# Patient Record
Sex: Male | Born: 1950
Health system: Southern US, Community
[De-identification: ages and names within clinical notes are randomized; demographics above are authoritative.]

## PROBLEM LIST (undated history)

## (undated) DIAGNOSIS — I10 Essential (primary) hypertension: Secondary | ICD-10-CM

## (undated) DIAGNOSIS — E7800 Pure hypercholesterolemia, unspecified: Secondary | ICD-10-CM

## (undated) DIAGNOSIS — N179 Acute kidney failure, unspecified: Secondary | ICD-10-CM

## (undated) DIAGNOSIS — J189 Pneumonia, unspecified organism: Secondary | ICD-10-CM

## (undated) HISTORY — DX: Pure hypercholesterolemia, unspecified: E78.00

## (undated) HISTORY — PX: COLONOSCOPY: SHX174

---
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
## 2006-02-26 ENCOUNTER — Ambulatory Visit: Payer: Self-pay | Admitting: Gastroenterology

## 2006-03-15 ENCOUNTER — Encounter (INDEPENDENT_AMBULATORY_CARE_PROVIDER_SITE_OTHER): Payer: Self-pay | Admitting: Specialist

## 2006-03-15 ENCOUNTER — Ambulatory Visit (HOSPITAL_COMMUNITY): Admission: RE | Admit: 2006-03-15 | Discharge: 2006-03-15 | Payer: Self-pay | Admitting: Gastroenterology

## 2006-03-15 ENCOUNTER — Ambulatory Visit: Payer: Self-pay | Admitting: Gastroenterology

## 2007-11-22 ENCOUNTER — Ambulatory Visit (HOSPITAL_COMMUNITY): Admission: RE | Admit: 2007-11-22 | Discharge: 2007-11-22 | Payer: Self-pay | Admitting: Family Medicine

## 2010-10-31 NOTE — Op Note (Signed)
Gregory Lindsey, Gregory Lindsey              ACCOUNT NO.:  [id number]   MEDICAL RECORD NO.:  [id number]          PATIENT TYPE:  AMB   LOCATION:  DAY                           FACILITY:  [facility]   PHYSICIAN:  Kassie Mends, M.D.      DATE OF BIRTH:  07-Jul-1950   DATE OF PROCEDURE:  03/15/2006  DATE OF DISCHARGE:                                 OPERATIVE REPORT   REFERRING PHYSICIAN:  Mila Homer. Sudie Bailey, M.D.   PROCEDURE:  Colonoscopy with snare polypectomy.   INDICATIONS FOR EXAM:  Mr. Manninen is a 60 year old male who presents with a  hemoglobin of 11.8, MCV of 89.4, and no evidence of overt GI bleed.  He has  no family history of colon cancer.   FINDINGS:  1. An 8 mm sessile ascending colon polyp, removed via snare cautery.  2. A 1 cm pedunculated sigmoid colon polyp removed via snare cautery      (200/25).  3. Otherwise no masses, inflammatory changes, or vascular ectasias seen.      No diverticula evident.  4. Normal retroflexed view of the rectum.   RECOMMENDATIONS:  1. The ascending colon polyp and the sigmoid colon polyp may explain      occult GI bleeding.  I will call Mr. Leider with his biopsy results.      If his polyps are adenomatous, he will need a repeat colonoscopy in 3-5      years.  His children and siblings will need screening colonoscopy      beginning at age 78 and a screening colonoscopy every 5 years.  2. He should be on a high-fiber diet.  He is given a handout on high-fiber      diet and polyps today.  3. Follow up with Dr. Gareth Morgan.  If his loose stools persist, he may      follow up with Dr. Kassie Mends.   PROCEDURAL TECHNIQUE:  Physical exam was performed, and informed consent was  obtained per the patient after explaining the risks, benefits and  alternatives to the procedure.  Patient was connected to the monitor and  placed in the left lateral decubitus position.  Continuous oxygen was  provided by nasal cannula, and IV medicine administered through  an  indwelling cannula.  After administration of sedation and rectal exam, the  patient's rectum  was intubated, and scope was advanced under direct visualization to the  cecum.  The scope was subsequently removed slowly by carefully examining the  color, texture, anatomy, and integrity of the mucosa on the way out.  The  patient was recovered in endoscopy suite and discharged home in satisfactory  condition.      Kassie Mends, M.D.  Electronically Signed     SM/MEDQ  D:  03/15/2006  T:  03/16/2006  Job:  [id number]   cc:   Mila Homer. Sudie Bailey, M.D.  Fax: [phone]

## 2010-10-31 NOTE — Consult Note (Signed)
Gregory Lindsey, Gregory Lindsey              ACCOUNT NO.:  [id number]   MEDICAL RECORD NO.:  [id number]          PATIENT TYPE:  AMB   LOCATION:                                FACILITY:  [facility]   PHYSICIAN:  Kassie Mends, M.D.      DATE OF BIRTH:  July 12, 1950   DATE OF CONSULTATION:  02/26/2006  DATE OF DISCHARGE:                                   CONSULTATION   REASON FOR CONSULTATION:  Anemia, possible blood in stool.   HISTORY OF PRESENT ILLNESS:  Gregory Lindsey is a 60 year old gentleman who  presents for further evaluation of recent diagnosis of anemia.  He has never  had a colonoscopy.  He was asked to collect hemoccults, but he has not done  this yet.  He says he has noted intermittent reddish tint to his stools but  he is not really sure if it is blood.  His bowel movements were typically  two a day, but recently when he began Januvia, he started having about 3-5  loose stools a day.  He denies any abdominal pain or melena.  No nausea,  vomiting, or heartburn.  His weight has been stable.   CURRENT MEDICATIONS:  Enalapril daily, Vytorin 10/40 mg daily, Metformin 500  mg b.i.d., Januvia 100 mg daily, Cialis p.r.n., aspirin 81 mg daily.   ALLERGIES:  No known drug allergies.   PAST MEDICAL HISTORY:  Hypertension, hyperlipidemia, type 2 diabetes  mellitus.   PAST SURGICAL HISTORY:  No prior surgical history.   FAMILY HISTORY:  Negative for colorectal cancer, chronic GI illnesses.  Father had diabetes, deceased at age 22.   SOCIAL HISTORY:  He is married and has one child.  He is employed with [employer].  He quit smoking 5 1/2 months ago.  He rarely consumes alcohol and  generally consists of wine.   REVIEW OF SYSTEMS:  See HPI for GI and constitutional.  CARDIOPULMONARY:  No  chest pain or shortness of breath.   PHYSICAL EXAMINATION:  VITAL SIGNS:  Weight 327, height 5 foot 10 inches, temperature 98.4, blood  pressure 118/70, pulse 84.  GENERAL:  Pleasant, obese middle aged  gentleman in no acute distress.  SKIN:  Warm and dry, no jaundice.  HEENT:  Sclerae nonicteric.  Oropharyngeal mucosa moist and pink.  No  lymphadenopathy or thyromegaly.  CHEST:  Lungs clear to auscultation.  CARDIAC:  Exam reveals regular rate and rhythm, normal S1 and S2, no  murmurs, gallops, and rubs.  ABDOMEN:  Positive bowel sounds, obese but symmetrical, soft, nontender.  No  organomegaly or masses.  No rebound tenderness or guarding.  No abdominal  bruits or herniae.  Examination somewhat limited due to body habitus.  EXTREMITIES:  No edema.   LABORATORY DATA:  White count 11,300, hemoglobin 11.8, hematocrit 37, MCV  89.4, platelets 272,000.  LFTs and MET-7 normal.  Hemoglobin A1c 6.8.   IMPRESSION:  Gregory Lindsey is a 60 year old gentleman recently found to have anemia  on routine blood work.  He states his stools are reddish tint, but he is not  sure if it is  blood.  Hemoccults are pending.  Recently, he has developed  increased frequency and his stools are now loose.  This may be due to a  medication change.  He has never had a colonoscopy and he wants further  evaluation of his symptoms.   PLAN:  1. Colonoscopy in the future with Dr. Kassie Mends.  2. He will hold his aspirin for three days prior to the procedure.   I would like to thank Dr. John Giovanni for allowing [place] take part in the  care of this patient.      Gregory Lindsey, P.A.      Kassie Mends, M.D.  Electronically Signed    LL/MEDQ  D:  02/26/2006  T:  02/27/2006  Job:  [id number]   cc:   Mila Homer. Sudie Bailey, M.D.  Fax: [phone]

## 2011-02-10 ENCOUNTER — Emergency Department (HOSPITAL_COMMUNITY)
Admission: EM | Admit: 2011-02-10 | Discharge: 2011-02-10 | Disposition: A | Discharge status: Home / Self Care | Payer: Managed Care, Other (non HMO) | Attending: Emergency Medicine | Admitting: Emergency Medicine

## 2011-02-10 ENCOUNTER — Emergency Department (HOSPITAL_COMMUNITY): Payer: Managed Care, Other (non HMO)

## 2011-02-10 DIAGNOSIS — M67919 Unspecified disorder of synovium and tendon, unspecified shoulder: Secondary | ICD-10-CM

## 2011-02-10 DIAGNOSIS — Z7982 Long term (current) use of aspirin: Secondary | ICD-10-CM | POA: Insufficient documentation

## 2011-02-10 DIAGNOSIS — S40019A Contusion of unspecified shoulder, initial encounter: Secondary | ICD-10-CM

## 2011-02-10 DIAGNOSIS — W010XXA Fall on same level from slipping, tripping and stumbling without subsequent striking against object, initial encounter: Secondary | ICD-10-CM | POA: Insufficient documentation

## 2011-02-10 DIAGNOSIS — M25519 Pain in unspecified shoulder: Secondary | ICD-10-CM | POA: Insufficient documentation

## 2011-02-10 DIAGNOSIS — Z79899 Other long term (current) drug therapy: Secondary | ICD-10-CM | POA: Insufficient documentation

## 2011-02-10 LAB — GLUCOSE, CAPILLARY

## 2011-02-10 MED ORDER — TRAMADOL HCL 50 MG PO TABS
100.0000 mg | ORAL_TABLET | Freq: Once | ORAL | Status: AC
  Administered 2011-02-10: 100 mg via ORAL
  Filled 2011-02-10: qty 2

## 2011-02-10 MED ORDER — ACETAMINOPHEN 325 MG PO TABS
650.0000 mg | ORAL_TABLET | Freq: Once | ORAL | Status: AC
  Administered 2011-02-10: 650 mg via ORAL
  Filled 2011-02-10: qty 2

## 2011-02-10 MED ORDER — TRAMADOL-ACETAMINOPHEN 37.5-325 MG PO TABS
2.0000 | ORAL_TABLET | Freq: Once | ORAL | Status: DC
  Filled 2011-02-10: qty 2

## 2011-02-10 MED ORDER — TRAMADOL-ACETAMINOPHEN 37.5-325 MG PO TABS: ORAL_TABLET | ORAL | Status: DC

## 2011-02-10 NOTE — ED Provider Notes (Signed)
Scribed for Ward Givens, MD, the patient was seen in room [facility]12/[facility] . This chart was scribed by Ellie Lunch. This patient's care was started at 5:42 PM.    CSN: [id number] Arrival date & time: 02/10/2011  5:10 PM  Chief Complaint  Patient presents with  . Shoulder Pain   HPI Gregory Lindsey is a 60 y.o. male who presents to the Emergency Department complaining of right shoulder pain. Pt reports he fell yesterday while mowing grass on a hillside. He slipped on wet grass and landed on his right shoulder. Pt reports associated swelling, limited ROM on Abduction, and tingling in 2 fingers. Pain is exacerbated by movement. Pt treated pain with a heating pad and arthritis cream with little improvement. There are no other associated symptoms and no other alleviating or aggravating factors.   Past Medical History  Diagnosis Date  . Diabetes mellitus     History reviewed. No pertinent past surgical history.  MEDICATIONS:  Previous Medications   ASPIRIN-ACETAMINOPHEN-CAFFEINE (EXCEDRIN PO)    Take 2 tablets by mouth as needed. For pain    ATORVASTATIN (LIPITOR) 80 MG TABLET    Take 80 mg by mouth daily.     CYANOCOBALAMIN 1000 MCG TABLET    Take 100 mcg by mouth 2 (two) times daily.     ENALAPRIL (VASOTEC) 20 MG TABLET    Take 20 mg by mouth daily.     FERROUS SULFATE (IRON SUPPLEMENT) 325 (65 FE) MG TABLET    Take 325 mg by mouth 2 (two) times daily.     GLIPIZIDE (GLUCOTROL) 10 MG TABLET    Take 10 mg by mouth 2 (two) times daily before a meal.     METFORMIN (GLUCOPHAGE) 1000 MG TABLET    Take 1,000 mg by mouth 2 (two) times daily with a meal.     NAPROXEN (NAPROSYN) 500 MG TABLET    Take 500 mg by mouth 2 (two) times daily with a meal.       ALLERGIES:  Allergies as of 02/10/2011  . (No Known Allergies)      History reviewed. No pertinent family history.  History  Substance Use Topics  . Smoking status: Never Smoker   . Smokeless tobacco: Not on file  . 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   Review of Systems  Musculoskeletal:       Right shoulder pain, swelling, and limited ROM on abduction.  All other systems reviewed and are negative.    Physical Exam  BP 145/64  Pulse 84  Temp(Src) 98.7 F (37.1 C) (Oral)  Resp 20  Ht 5\' 11"  (1.803 m)  Wt 310 lb (140.615 kg)  BMI 43.24 kg/m2  SpO2 100%  Physical Exam  Constitutional: He appears well-developed and well-nourished.  HENT:  Head: Normocephalic and atraumatic.  Eyes: Conjunctivae and EOM are normal. Pupils are equal, round, and reactive to light.  Neck: Normal range of motion. Neck supple.  Pulmonary/Chest: Effort normal.  Musculoskeletal:       Pt has nontender clavicle, his shoulder appears to not be dislocated. He has pain and restricted abduction of his right shoulder which causes pain. Neurovasc intact  Neurological: No cranial nerve deficit.  Skin: Skin is warm. No abrasion and no rash noted.  Psychiatric: He has a normal mood and affect. His speech is normal and behavior is normal.    Procedures   OTHER DATA REVIEWED: Nursing notes, vital signs,  and past medical records reviewed.   DIAGNOSTIC STUDIES: Oxygen Saturation is 100% on room air, normal by my interpretation.    RADIOLOGY:  No results found for this or any previous visit. Dg Shoulder Right  02/10/2011  *RADIOLOGY REPORT*  Clinical Data: Fall, right shoulder pain  RIGHT SHOULDER - 2+ VIEW  Comparison: None.  Findings: No fracture or dislocation.  No soft tissue abnormality. No radiopaque foreign body.  Right lung apex is clear.  AC joint distance is normal, with minimal degenerative changes.  IMPRESSION: No acute osseous abnormality.  Original Report Authenticated By: Harrel Lemon, M.D.   ED COURSE / COORDINATION OF CARE: Pt placed in a shoulder immobilizer and started on pain meds. He was advised he most likely has a rotator cuff injury and will need to be seen by an  orthopedist.   MDM:  MEDICATIONS GIVEN IN THE E.D.  Medications  traMADol-acetaminophen (ULTRACET) 37.5-325 MG per tablet 2 tablet (not administered)    DISCHARGE MEDICATIONS: New Prescriptions   TRAMADOL-ACETAMINOPHEN (ULTRACET) 37.5-325 MG PER TABLET    Take 2 tabs po QID prn pain     I personally performed the services described in this documentation, which was scribed in my presence. The recorded information has been reviewed and considered. Devoria Albe, MD, Armando Gang    Ward Givens, MD 02/10/11 [phone]

## 2011-02-10 NOTE — ED Notes (Signed)
Shoulder immobilzer placed

## 2015-03-13 ENCOUNTER — Encounter: Payer: Self-pay | Admitting: Gastroenterology

## 2015-03-27 ENCOUNTER — Encounter: Payer: Self-pay | Admitting: Gastroenterology

## 2015-03-27 ENCOUNTER — Ambulatory Visit (INDEPENDENT_AMBULATORY_CARE_PROVIDER_SITE_OTHER): Payer: BLUE CROSS/BLUE SHIELD | Admitting: Gastroenterology

## 2015-03-27 ENCOUNTER — Other Ambulatory Visit: Payer: Self-pay

## 2015-03-27 ENCOUNTER — Ambulatory Visit: Payer: Managed Care, Other (non HMO) | Admitting: Nurse Practitioner

## 2015-03-27 VITALS — BP 139/83 | HR 90 | Temp 97.5°F | Ht 68.0 in | Wt 283.4 lb

## 2015-03-27 DIAGNOSIS — Z86010 Personal history of colonic polyps: Secondary | ICD-10-CM

## 2015-03-27 MED ORDER — PEG 3350-KCL-NA BICARB-NACL 420 G PO SOLR: 4000.0000 mL | ORAL | Status: DC

## 2015-03-27 NOTE — Patient Instructions (Signed)
Hold iron for 7 days before the procedure.  We have scheduled you for a colonoscopy with Dr. Oneida Alar.

## 2015-03-27 NOTE — Progress Notes (Signed)
cc'ed to pcp °

## 2015-03-27 NOTE — Assessment & Plan Note (Signed)
64 year old male with history of polyps in 2007, due for surveillance now. No concerning lower GI or upper GI symptoms.   Proceed with colonoscopy with Dr. Oneida Alar in the near future. The risks, benefits, and alternatives have been discussed in detail with the patient. They state understanding and desire to proceed.  Phenergan 12.5 mg IV on call (drinks beer a few times a week and 2 shots on Saturday)

## 2015-03-27 NOTE — Progress Notes (Signed)
Primary Care Physician:  Robert Bellow, MD Primary Gastroenterologist:  Dr. Oneida Alar   Chief Complaint  Patient presents with  . set upt TCS    HPI:   Gregory Lindsey is a 64 y.o. male presenting today to schedule surveillance colonoscopy. His first colonoscopy was in 2007 by Dr. Oneida Alar with 24m sessile hyperplastic polyp in ascending colon, 1 cm pedunculated tubular adenoma in sigmoid colon. He is overdue for surveillance.   No significant bowel habit changes. No rectal bleeding. No abdominal pain. No significant upper GI symptoms. No dysphagia. No weight loss or lack of appetite.   Past Medical History  Diagnosis Date  . Diabetes mellitus   . Hypercholesterolemia     Past Surgical History  Procedure Laterality Date  . Colonoscopy  2007    Dr. FOneida Alar 854msessile hyperplastic polyp in ascending colon, 1 cm pedunculated tubular adenoma in sigmoid colon     Current Outpatient Prescriptions  Medication Sig Dispense Refill  . aspirin 81 MG tablet Take 81 mg by mouth daily.    . cyanocobalamin 1000 MCG tablet Take 100 mcg by mouth 2 (two) times daily.      . enalapril (VASOTEC) 10 MG tablet     . ferrous sulfate (IRON SUPPLEMENT) 325 (65 FE) MG tablet Take 325 mg by mouth 2 (two) times daily.      . metFORMIN (GLUCOPHAGE) 1000 MG tablet Take 1,000 mg by mouth 2 (two) times daily with a meal.      . pravastatin (PRAVACHOL) 40 MG tablet      No current facility-administered medications for this visit.    Allergies as of 03/27/2015  . (No Known Allergies)    Family History  Problem Relation Age of Onset  . Colon cancer Neg Hx     Social History   Social History  . Marital Status: Married    Spouse Name: N/A  . Number of Children: N/A  . Years of Education: N/A   Occupational History  . employed    Social History Main Topics  . Smoking status: Never Smoker   . Smokeless tobacco: Not on file  . Alcohol Use: 0.0 oz/week    0 Standard drinks or equivalent  per week     Comment: beer 2-3 times per week, 2 shots of scotch on saturday night   . Drug Use: No  . Sexual Activity: Not on file   Other Topics Concern  . Not on file   Social History Narrative    Review of Systems: Gen: see HPI  CV: Denies chest pain, heart palpitations, peripheral edema, syncope.  Resp: Denies shortness of breath at rest or with exertion. Denies wheezing or cough.  GI: see HPI  GU : Denies urinary burning, urinary frequency, urinary hesitancy MS: +knee pain  Derm: Denies rash, itching, dry skin Psych: Denies depression, anxiety, memory loss, and confusion Heme: Denies bruising, bleeding, and enlarged lymph nodes.  Physical Exam: BP 139/83 mmHg  Pulse 90  Temp(Src) 97.5 F (36.4 C)  Ht '5\' 8"'$  (1.727 m)  Wt 283 lb 6.4 oz (128.549 kg)  BMI 43.10 kg/m2 General:   Alert and oriented. Pleasant and cooperative. Well-nourished and well-developed.  Head:  Normocephalic and atraumatic. Eyes:  Without icterus, sclera clear and conjunctiva pink.  Ears:  Normal auditory acuity. Nose:  No deformity, discharge,  or lesions. Mouth:  No deformity or lesions, oral mucosa pink. Edentulous.  Lungs:  Clear to auscultation bilaterally. No wheezes, rales, or rhonchi. No  distress.  Heart:  S1, S2 present without murmurs appreciated.  Abdomen:  +BS, soft, non-tender and non-distended. No HSM noted. No guarding or rebound. No masses appreciated.  Rectal:  Deferred  Msk:  Symmetrical without gross deformities. Normal posture. Extremities:  Without  edema. Neurologic:  Alert and  oriented x4;  grossly normal neurologically. Skin:  Intact without significant lesions or rashes. Psych:  Alert and cooperative. Normal mood and affect.

## 2015-04-26 ENCOUNTER — Encounter (HOSPITAL_COMMUNITY): Payer: Self-pay | Admitting: *Deleted

## 2015-04-26 ENCOUNTER — Ambulatory Visit (HOSPITAL_COMMUNITY)
Admission: RE | Admit: 2015-04-26 | Discharge: 2015-04-26 | Disposition: A | Discharge status: Home / Self Care | Payer: BLUE CROSS/BLUE SHIELD | Source: Ambulatory Visit | Attending: Gastroenterology | Admitting: Gastroenterology

## 2015-04-26 ENCOUNTER — Encounter (HOSPITAL_COMMUNITY)
Admission: RE | Disposition: A | Discharge status: Home / Self Care | Payer: Self-pay | Source: Ambulatory Visit | Attending: Gastroenterology

## 2015-04-26 DIAGNOSIS — Z7982 Long term (current) use of aspirin: Secondary | ICD-10-CM | POA: Insufficient documentation

## 2015-04-26 DIAGNOSIS — K648 Other hemorrhoids: Secondary | ICD-10-CM | POA: Insufficient documentation

## 2015-04-26 DIAGNOSIS — Z7984 Long term (current) use of oral hypoglycemic drugs: Secondary | ICD-10-CM | POA: Insufficient documentation

## 2015-04-26 DIAGNOSIS — E119 Type 2 diabetes mellitus without complications: Secondary | ICD-10-CM | POA: Diagnosis not present

## 2015-04-26 DIAGNOSIS — Z86010 Personal history of colonic polyps: Secondary | ICD-10-CM

## 2015-04-26 DIAGNOSIS — E7800 Pure hypercholesterolemia, unspecified: Secondary | ICD-10-CM | POA: Diagnosis not present

## 2015-04-26 DIAGNOSIS — Z1211 Encounter for screening for malignant neoplasm of colon: Secondary | ICD-10-CM | POA: Insufficient documentation

## 2015-04-26 DIAGNOSIS — Q438 Other specified congenital malformations of intestine: Secondary | ICD-10-CM | POA: Diagnosis not present

## 2015-04-26 DIAGNOSIS — Z79899 Other long term (current) drug therapy: Secondary | ICD-10-CM | POA: Insufficient documentation

## 2015-04-26 HISTORY — PX: COLONOSCOPY: SHX5424

## 2015-04-26 LAB — GLUCOSE, CAPILLARY: Glucose-Capillary: 145 mg/dL — ABNORMAL HIGH (ref 65–99)

## 2015-04-26 SURGERY — COLONOSCOPY
Anesthesia: Moderate Sedation

## 2015-04-26 MED ORDER — MEPERIDINE HCL 100 MG/ML IJ SOLN
INTRAMUSCULAR | Status: DC | PRN
  Administered 2015-04-26: 50 mg via INTRAVENOUS

## 2015-04-26 MED ORDER — MIDAZOLAM HCL 5 MG/5ML IJ SOLN
INTRAMUSCULAR | Status: DC | PRN
  Administered 2015-04-26: 2 mg via INTRAVENOUS
  Administered 2015-04-26: 1 mg via INTRAVENOUS

## 2015-04-26 MED ORDER — STERILE WATER FOR IRRIGATION IR SOLN
Status: DC | PRN
  Administered 2015-04-26: 10:00:00

## 2015-04-26 MED ORDER — SODIUM CHLORIDE 0.9 % IJ SOLN
INTRAMUSCULAR | Status: AC
Start: 2015-04-26 — End: 2015-04-26
  Filled 2015-04-26: qty 3

## 2015-04-26 MED ORDER — MIDAZOLAM HCL 5 MG/5ML IJ SOLN
INTRAMUSCULAR | Status: AC
  Filled 2015-04-26: qty 10

## 2015-04-26 MED ORDER — PROMETHAZINE HCL 25 MG/ML IJ SOLN
INTRAMUSCULAR | Status: AC
  Filled 2015-04-26: qty 1

## 2015-04-26 MED ORDER — MEPERIDINE HCL 100 MG/ML IJ SOLN
INTRAMUSCULAR | Status: AC
  Filled 2015-04-26: qty 2

## 2015-04-26 MED ORDER — SODIUM CHLORIDE 0.9 % IV SOLN
INTRAVENOUS | Status: DC
  Administered 2015-04-26: 09:00:00 via INTRAVENOUS

## 2015-04-26 MED ORDER — PROMETHAZINE HCL 25 MG/ML IJ SOLN
12.5000 mg | Freq: Once | INTRAMUSCULAR | Status: AC
  Administered 2015-04-26: 12.5 mg via INTRAVENOUS

## 2015-04-26 NOTE — H&P (Signed)
  Primary Care Physician:  Robert Bellow, MD Primary Gastroenterologist:  Dr. Oneida Alar  Pre-Procedure History & Physical: HPI:  Gregory Lindsey is a 64 y.o. male here for  PERSONAL HISTORY OF POLYPS.  Past Medical History  Diagnosis Date  . Diabetes mellitus   . Hypercholesterolemia     Past Surgical History  Procedure Laterality Date  . Colonoscopy  2007    Dr. Oneida Alar: 2m sessile hyperplastic polyp in ascending colon, 1 cm pedunculated tubular adenoma in sigmoid colon     Prior to Admission medications   Medication Sig Start Date End Date Taking? Authorizing Provider  aspirin 81 MG tablet Take 81 mg by mouth daily.   Yes Historical Provider, MD  cyanocobalamin 1000 MCG tablet Take 100 mcg by mouth 2 (two) times daily.     Yes Historical Provider, MD  enalapril (VASOTEC) 10 MG tablet  03/20/15  Yes Historical Provider, MD  ferrous sulfate (IRON SUPPLEMENT) 325 (65 FE) MG tablet Take 325 mg by mouth 2 (two) times daily.     Yes Historical Provider, MD  ibuprofen (ADVIL,MOTRIN) 800 MG tablet Take 800 mg by mouth every 8 (eight) hours as needed.   Yes Historical Provider, MD  metFORMIN (GLUCOPHAGE) 1000 MG tablet Take 1,000 mg by mouth 2 (two) times daily with a meal.     Yes Historical Provider, MD  polyethylene glycol-electrolytes (TRILYTE) 420 G solution Take 4,000 mLs by mouth as directed. 03/27/15  Yes SDanie Binder MD  pravastatin (PRAVACHOL) 40 MG tablet  03/20/15  Yes Historical Provider, MD  sildenafil (REVATIO) 20 MG tablet Take 20 mg by mouth daily as needed.    Yes Historical Provider, MD    Allergies as of 03/27/2015  . (No Known Allergies)    Family History  Problem Relation Age of Onset  . Colon cancer Neg Hx     Social History   Social History  . Marital Status: Married    Spouse Name: N/A  . Number of Children: N/A  . Years of Education: N/A   Occupational History  . employed    Social History Main Topics  . Smoking status: Never Smoker   .  Smokeless tobacco: Not on file  . Alcohol Use: 0.0 oz/week    0 Standard drinks or equivalent per week     Comment: beer 2-3 times per week, 2 shots of scotch on saturday night   . Drug Use: No  . Sexual Activity: Not on file   Other Topics Concern  . Not on file   Social History Narrative    Review of Systems: See HPI, otherwise negative ROS   Physical Exam: BP 141/73 mmHg  Pulse 92  Temp(Src) 99.3 F (37.4 C) (Oral)  Resp 18  Ht '5\' 8"'$  (1.727 m)  Wt 271 lb (122.925 kg)  BMI 41.22 kg/m2  SpO2 99% General:   Alert,  pleasant and cooperative in NAD Head:  Normocephalic and atraumatic. Neck:  Supple; Lungs:  Clear throughout to auscultation.    Heart:  Regular rate and rhythm. Abdomen:  Soft, nontender and nondistended. Normal bowel sounds, without guarding, and without rebound.   Neurologic:  Alert and  oriented x4;  grossly normal neurologically.  Impression/Plan:    PERSONAL HISTORY OF POLYPS.  PLAN:  1. TCS TODAY

## 2015-04-26 NOTE — Discharge Instructions (Signed)
You have internal hemorrhoids. YOU DID NOT HAVE ANY POLYPS.   FOLLOW A HIGH FIBER DIET. AVOID ITEMS THAT CAUSE BLOATING. SEE INFO BELOW.  USE PREParation H 2 TO 4 TIMES A DAY AS NEEDED FOR RECTAL PRESSURE/PAIN/BLEEDING.  Next colonoscopy in 5 years. YOUR SISTERS, BROTHERS, CHILDREN, AND PARENTS NEED TO HAVE A COLONOSCOPY STARTING AT THE AGE OF 40.     Colonoscopy Care After Read the instructions outlined below and refer to this sheet in the next week. These discharge instructions provide you with general information on caring for yourself after you leave the hospital. While your treatment has been planned according to the most current medical practices available, unavoidable complications occasionally occur. If you have any problems or questions after discharge, call DR. Vannie Hochstetler, [phone].  ACTIVITY  You may resume your regular activity, but move at a slower pace for the next 24 hours.   Take frequent rest periods for the next 24 hours.   Walking will help get rid of the air and reduce the bloated feeling in your belly (abdomen).   No driving for 24 hours (because of the medicine (anesthesia) used during the test).   You may shower.   Do not sign any important legal documents or operate any machinery for 24 hours (because of the anesthesia used during the test).    NUTRITION  Drink plenty of fluids.   You may resume your normal diet as instructed by your doctor.   Begin with a light meal and progress to your normal diet. Heavy or fried foods are harder to digest and may make you feel sick to your stomach (nauseated).   Avoid alcoholic beverages for 24 hours or as instructed.    MEDICATIONS  You may resume your normal medications.   WHAT YOU CAN EXPECT TODAY  Some feelings of bloating in the abdomen.   Passage of more gas than usual.   Spotting of blood in your stool or on the toilet paper  .  IF YOU HAD POLYPS REMOVED DURING THE COLONOSCOPY:  Eat a soft  diet IF YOU HAVE NAUSEA, BLOATING, ABDOMINAL PAIN, OR VOMITING.    FINDING OUT THE RESULTS OF YOUR TEST Not all test results are available during your visit. DR. Oneida Alar WILL CALL YOU WITHIN 7 DAYS OF YOUR PROCEDUE WITH YOUR RESULTS. Do not assume everything is normal if you have not heard from DR. Sai Moura IN ONE WEEK, CALL HER OFFICE AT [phone].  SEEK IMMEDIATE MEDICAL ATTENTION AND CALL THE OFFICE: [phone] IF:  You have more than a spotting of blood in your stool.   Your belly is swollen (abdominal distention).   You are nauseated or vomiting.   You have a temperature over 101F.   You have abdominal pain or discomfort that is severe or gets worse throughout the day.  High-Fiber Diet A high-fiber diet changes your normal diet to include more whole grains, legumes, fruits, and vegetables. Changes in the diet involve replacing refined carbohydrates with unrefined foods. The calorie level of the diet is essentially unchanged. The Dietary Reference Intake (recommended amount) for adult males is 38 grams per day. For adult females, it is 25 grams per day. Pregnant and lactating women should consume 28 grams of fiber per day. Fiber is the intact part of a plant that is not broken down during digestion. Functional fiber is fiber that has been isolated from the plant to provide a beneficial effect in the body. PURPOSE  Increase stool bulk.   Ease and regulate  bowel movements.   Lower cholesterol.  INDICATIONS THAT YOU NEED MORE FIBER  Constipation and hemorrhoids.   Uncomplicated diverticulosis (intestine condition) and irritable bowel syndrome.   Weight management.   As a protective measure against hardening of the arteries (atherosclerosis), diabetes, and cancer.   GUIDELINES FOR INCREASING FIBER IN THE DIET  Start adding fiber to the diet slowly. A gradual increase of about 5 more grams (2 slices of whole-wheat bread, 2 servings of most fruits or vegetables, or 1 bowl of  high-fiber cereal) per day is best. Too rapid an increase in fiber may result in constipation, flatulence, and bloating.   Drink enough water and fluids to keep your urine clear or pale yellow. Water, juice, or caffeine-free drinks are recommended. Not drinking enough fluid may cause constipation.   Eat a variety of high-fiber foods rather than one type of fiber.   Try to increase your intake of fiber through using high-fiber foods rather than fiber pills or supplements that contain small amounts of fiber.   The goal is to change the types of food eaten. Do not supplement your present diet with high-fiber foods, but replace foods in your present diet.  INCLUDE A VARIETY OF FIBER SOURCES  Replace refined and processed grains with whole grains, canned fruits with fresh fruits, and incorporate other fiber sources. White rice, white breads, and most bakery goods contain little or no fiber.   Brown whole-grain rice, buckwheat oats, and many fruits and vegetables are all good sources of fiber. These include: broccoli, Brussels sprouts, cabbage, cauliflower, beets, sweet potatoes, white potatoes (skin on), carrots, tomatoes, eggplant, squash, berries, fresh fruits, and dried fruits.   Cereals appear to be the richest source of fiber. Cereal fiber is found in whole grains and bran. Bran is the fiber-rich outer coat of cereal grain, which is largely removed in refining. In whole-grain cereals, the bran remains. In breakfast cereals, the largest amount of fiber is found in those with "bran" in their names. The fiber content is sometimes indicated on the label.   You may need to include additional fruits and vegetables each day.   In baking, for 1 cup white flour, you may use the following substitutions:   1 cup whole-wheat flour minus 2 tablespoons.   1/2 cup white flour plus 1/2 cup whole-wheat flour.   Hemorrhoids Hemorrhoids are dilated (enlarged) veins around the rectum. Sometimes clots will form  in the veins. This makes them swollen and painful. These are called thrombosed hemorrhoids. Causes of hemorrhoids include:  Constipation.   Straining to have a bowel movement.   HEAVY LIFTING HOME CARE INSTRUCTIONS  Eat a well balanced diet and drink 6 to 8 glasses of water every day to avoid constipation. You may also use a bulk laxative.   Avoid straining to have bowel movements.   Keep anal area dry and clean.   Do not use a donut shaped pillow or sit on the toilet for long periods. This increases blood pooling and pain.   Move your bowels when your body has the urge; this will require less straining and will decrease pain and pressure.

## 2015-04-26 NOTE — Progress Notes (Signed)
REVIEWED-NO ADDITIONAL RECOMMENDATIONS. 

## 2015-04-26 NOTE — Op Note (Signed)
[facility] [address] [place], [address]   COLONOSCOPY PROCEDURE REPORT  PATIENT: Gregory Lindsey, Gregory Lindsey  MR#: [id number] BIRTHDATE: 18-Jul-1950 , 41  yrs. old GENDER: male ENDOSCOPIST: Danie Binder, MD REFERRED LK:JZPHX Karie Kirks, M.D. PROCEDURE DATE:  05-06-2015 PROCEDURE:   Colonoscopy, screening INDICATIONS:high risk patient with personal history of colonic polyps.-1 CM SIMPLE ADENOMA REMOVED IN 2007 MEDICATIONS: Demerol 50 mg IV and Versed 3 mg IV  DESCRIPTION OF PROCEDURE:    Physical exam was performed.  Informed consent was obtained from the patient after explaining the benefits, risks, and alternatives to procedure.  The patient was connected to monitor and placed in left lateral position. Continuous oxygen was provided by nasal cannula and IV medicine administered through an indwelling cannula.  After administration of sedation and rectal exam, the patients rectum was intubated and the EC-[id number] ([id number])  colonoscope was advanced under direct visualization to the cecum.  The scope was removed slowly by carefully examining the color, texture, anatomy, and integrity mucosa on the way out.  The patient was recovered in endoscopy and discharged home in satisfactory condition. Estimated blood loss is zero unless otherwise noted in this procedure report.    COLON FINDINGS: The colon was redundant.  Manual abdominal counter-pressure was used to reach the cecum, The colonic mucosa appeared normal throughout the entire examined colon.  , and Moderate sized internal hemorrhoids were found.  PREP QUALITY: excellent.  CECAL W/D TIME: 12       minutes COMPLICATIONS: None  ENDOSCOPIC IMPRESSION: 1.   The LEFT colon IS redundant 2.   Moderate sized internal hemorrhoids  RECOMMENDATIONS: FOLLOW A HIGH FIBER DIET. USE PREParation H 2 TO 4 TIMES A DAY AS NEEDED FOR RECTAL PRESSURE/PAIN/BLEEDING. Next colonoscopy in 5 years.  ALL SISTERS, BROTHERS, CHILDREN,  AND PARENTS NEED TO HAVE A COLONOSCOPY STARTING AT THE AGE OF 40.      _______________________________ eSignedDanie Binder, MD 06-May-2015 4:52 PM    CPT CODES: ICD CODES:  The ICD and CPT codes recommended by this software are interpretations from the data that the clinical staff has captured with the software.  The verification of the translation of this report to the ICD and CPT codes and modifiers is the sole responsibility of the health care institution and practicing physician where this report was generated.  [employer] will not be held responsible for the validity of the ICD and CPT codes included on this report.  AMA assumes no liability for data contained or not contained herein. CPT is a Designer, television/film set of the [employer].

## 2015-04-30 ENCOUNTER — Encounter (HOSPITAL_COMMUNITY): Payer: Self-pay | Admitting: Gastroenterology

## 2015-05-22 ENCOUNTER — Other Ambulatory Visit: Payer: Self-pay | Admitting: Urology

## 2015-05-22 ENCOUNTER — Ambulatory Visit (INDEPENDENT_AMBULATORY_CARE_PROVIDER_SITE_OTHER): Payer: BLUE CROSS/BLUE SHIELD | Admitting: Urology

## 2015-05-22 DIAGNOSIS — R972 Elevated prostate specific antigen [PSA]: Secondary | ICD-10-CM

## 2015-05-22 DIAGNOSIS — N5201 Erectile dysfunction due to arterial insufficiency: Secondary | ICD-10-CM | POA: Diagnosis not present

## 2015-05-22 DIAGNOSIS — R351 Nocturia: Secondary | ICD-10-CM

## 2015-05-22 DIAGNOSIS — N401 Enlarged prostate with lower urinary tract symptoms: Secondary | ICD-10-CM

## 2015-05-29 ENCOUNTER — Other Ambulatory Visit: Payer: Self-pay | Admitting: Urology

## 2015-05-29 DIAGNOSIS — R972 Elevated prostate specific antigen [PSA]: Secondary | ICD-10-CM

## 2015-06-05 ENCOUNTER — Ambulatory Visit (HOSPITAL_COMMUNITY)
Admission: RE | Admit: 2015-06-05 | Discharge: 2015-06-05 | Disposition: A | Discharge status: Home / Self Care | Payer: BLUE CROSS/BLUE SHIELD | Source: Ambulatory Visit | Attending: Urology | Admitting: Urology

## 2015-06-05 VITALS — BP 143/79 | HR 82 | Temp 98.3°F | Resp 16

## 2015-06-05 DIAGNOSIS — R972 Elevated prostate specific antigen [PSA]: Secondary | ICD-10-CM | POA: Diagnosis not present

## 2015-06-05 MED ORDER — LIDOCAINE HCL (PF) 2 % IJ SOLN
INTRAMUSCULAR | Status: AC
  Administered 2015-06-05: 10 mL
  Filled 2015-06-05: qty 10

## 2015-06-05 MED ORDER — GENTAMICIN SULFATE 40 MG/ML IJ SOLN
INTRAMUSCULAR | Status: AC
  Administered 2015-06-05: 80 mg via INTRAMUSCULAR
  Filled 2015-06-05: qty 2

## 2015-06-05 NOTE — Discharge Instructions (Signed)
Transrectal Ultrasound-Guided Biopsy A transrectal ultrasound-guided biopsy is a procedure to take samples of tissue from your prostate. Ultrasound images are used to guide the procedure. It is usually done to check the prostate gland for cancer. BEFORE THE PROCEDURE  Do not eat or drink after midnight on the night before your procedure.  Take medicines as your doctor tells you.  Your doctor may have you stop taking some medicines 5-7 days before the procedure.  You will be given an enema before your procedure. During an enema, a liquid is put into your butt (rectum) to clear out waste.  You may have lab tests the day of your procedure.  Make plans to have someone drive you home. PROCEDURE  You will be given medicine to help you relax before the procedure. An IV tube will be put into one of your veins. It will be used to give fluids and medicine.  You will be given medicine to reduce the risk of infection (antibiotic).  You will be placed on your side.  A probe with gel will be put in your butt. This is used to take pictures of your prostate and the area around it.  A medicine to numb the area is put into your prostate.  A biopsy needle is then inserted and guided to your prostate.  Samples of prostate tissue are taken. The needle is removed.  The samples are sent to a lab to be checked. Results are usually back in 2-3 days. AFTER THE PROCEDURE  You will be taken to a room where you will be watched until you are doing okay.  You may have some pain in the area around your butt. You will be given medicines for this.  You may be able to go home the same day. Sometimes, an overnight stay in the hospital is needed.   This information is not intended to replace advice given to you by your health care provider. Make sure you discuss any questions you have with your health care provider.   Document Released: 05/20/2009 Document Revised: 06/06/2013 Document Reviewed:  01/18/2013 Elsevier Interactive Patient Education [employer].

## 2015-06-19 ENCOUNTER — Ambulatory Visit (INDEPENDENT_AMBULATORY_CARE_PROVIDER_SITE_OTHER): Payer: BLUE CROSS/BLUE SHIELD | Admitting: Urology

## 2015-06-19 DIAGNOSIS — N401 Enlarged prostate with lower urinary tract symptoms: Secondary | ICD-10-CM

## 2015-06-19 DIAGNOSIS — N5201 Erectile dysfunction due to arterial insufficiency: Secondary | ICD-10-CM

## 2015-06-19 DIAGNOSIS — R351 Nocturia: Secondary | ICD-10-CM

## 2016-01-20 ENCOUNTER — Encounter (HOSPITAL_COMMUNITY): Payer: Self-pay | Admitting: Emergency Medicine

## 2016-01-20 ENCOUNTER — Emergency Department (HOSPITAL_COMMUNITY): Payer: BLUE CROSS/BLUE SHIELD

## 2016-01-20 ENCOUNTER — Inpatient Hospital Stay (HOSPITAL_COMMUNITY)
Admission: EM | Admit: 2016-01-20 | Discharge: 2016-01-23 | DRG: 418 | Disposition: A | Discharge status: Home / Self Care | Payer: BLUE CROSS/BLUE SHIELD | Attending: Internal Medicine | Admitting: Internal Medicine

## 2016-01-20 DIAGNOSIS — E876 Hypokalemia: Secondary | ICD-10-CM | POA: Diagnosis present

## 2016-01-20 DIAGNOSIS — K810 Acute cholecystitis: Secondary | ICD-10-CM | POA: Diagnosis not present

## 2016-01-20 DIAGNOSIS — Z6841 Body Mass Index (BMI) 40.0 and over, adult: Secondary | ICD-10-CM | POA: Diagnosis not present

## 2016-01-20 DIAGNOSIS — E7800 Pure hypercholesterolemia, unspecified: Secondary | ICD-10-CM | POA: Diagnosis present

## 2016-01-20 DIAGNOSIS — R1013 Epigastric pain: Secondary | ICD-10-CM | POA: Diagnosis present

## 2016-01-20 DIAGNOSIS — K8000 Calculus of gallbladder with acute cholecystitis without obstruction: Principal | ICD-10-CM | POA: Diagnosis present

## 2016-01-20 DIAGNOSIS — K819 Cholecystitis, unspecified: Secondary | ICD-10-CM | POA: Diagnosis present

## 2016-01-20 DIAGNOSIS — E785 Hyperlipidemia, unspecified: Secondary | ICD-10-CM | POA: Diagnosis not present

## 2016-01-20 DIAGNOSIS — E118 Type 2 diabetes mellitus with unspecified complications: Secondary | ICD-10-CM | POA: Diagnosis not present

## 2016-01-20 DIAGNOSIS — I10 Essential (primary) hypertension: Secondary | ICD-10-CM | POA: Diagnosis not present

## 2016-01-20 DIAGNOSIS — E1165 Type 2 diabetes mellitus with hyperglycemia: Secondary | ICD-10-CM | POA: Diagnosis present

## 2016-01-20 DIAGNOSIS — E869 Volume depletion, unspecified: Secondary | ICD-10-CM

## 2016-01-20 DIAGNOSIS — E119 Type 2 diabetes mellitus without complications: Secondary | ICD-10-CM

## 2016-01-20 HISTORY — DX: Pneumonia, unspecified organism: J18.9

## 2016-01-20 HISTORY — DX: Essential (primary) hypertension: I10

## 2016-01-20 LAB — URINALYSIS, ROUTINE W REFLEX MICROSCOPIC
Bilirubin Urine: NEGATIVE
GLUCOSE, UA: 100 mg/dL — AB
HGB URINE DIPSTICK: NEGATIVE
Ketones, ur: NEGATIVE mg/dL
LEUKOCYTES UA: NEGATIVE
Nitrite: NEGATIVE
PH: 5 (ref 5.0–8.0)
SPECIFIC GRAVITY, URINE: 1.02 (ref 1.005–1.030)

## 2016-01-20 LAB — LIPASE, BLOOD: LIPASE: 59 U/L — AB (ref 11–51)

## 2016-01-20 LAB — COMPREHENSIVE METABOLIC PANEL
ALBUMIN: 4.3 g/dL (ref 3.5–5.0)
ALK PHOS: 64 U/L (ref 38–126)
ALT: 19 U/L (ref 17–63)
ANION GAP: 9 (ref 5–15)
AST: 20 U/L (ref 15–41)
BILIRUBIN TOTAL: 0.6 mg/dL (ref 0.3–1.2)
BUN: 17 mg/dL (ref 6–20)
CO2: 25 mmol/L (ref 22–32)
Calcium: 8.4 mg/dL — ABNORMAL LOW (ref 8.9–10.3)
Chloride: 101 mmol/L (ref 101–111)
Creatinine, Ser: 1.18 mg/dL (ref 0.61–1.24)
GFR calc Af Amer: >60 mL/min (ref >60)
GFR calc non Af Amer: >60 mL/min (ref >60)
GLUCOSE: 233 mg/dL — AB (ref 65–99)
POTASSIUM: 3.4 mmol/L — AB (ref 3.5–5.1)
SODIUM: 135 mmol/L (ref 135–145)
Total Protein: 8.3 g/dL — ABNORMAL HIGH (ref 6.5–8.1)

## 2016-01-20 LAB — MAGNESIUM: Magnesium: 1.7 mg/dL (ref 1.7–2.4)

## 2016-01-20 LAB — CBC WITH DIFFERENTIAL/PLATELET
BASOS ABS: 0 10*3/uL (ref 0.0–0.1)
BASOS PCT: 0 %
EOS ABS: 0 10*3/uL (ref 0.0–0.7)
Eosinophils Relative: 0 %
HEMATOCRIT: 34.7 % — AB (ref 39.0–52.0)
HEMOGLOBIN: 11.2 g/dL — AB (ref 13.0–17.0)
Lymphocytes Relative: 9 %
Lymphs Abs: 1.5 10*3/uL (ref 0.7–4.0)
MCH: 28.1 pg (ref 26.0–34.0)
MCHC: 32.3 g/dL (ref 30.0–36.0)
MCV: 87.2 fL (ref 78.0–100.0)
MONOS PCT: 5 %
Monocytes Absolute: 0.8 10*3/uL (ref 0.1–1.0)
NEUTROS ABS: 13.8 10*3/uL — AB (ref 1.7–7.7)
NEUTROS PCT: 86 %
Platelets: 255 10*3/uL (ref 150–400)
RBC: 3.98 MIL/uL — ABNORMAL LOW (ref 4.22–5.81)
RDW: 13.7 % (ref 11.5–15.5)
WBC: 16.1 10*3/uL — ABNORMAL HIGH (ref 4.0–10.5)

## 2016-01-20 LAB — CBG MONITORING, ED: GLUCOSE-CAPILLARY: 197 mg/dL — AB (ref 65–99)

## 2016-01-20 LAB — GLUCOSE, CAPILLARY: Glucose-Capillary: 162 mg/dL — ABNORMAL HIGH (ref 65–99)

## 2016-01-20 LAB — URINE MICROSCOPIC-ADD ON
BACTERIA UA: NONE SEEN
RBC / HPF: NONE SEEN RBC/hpf (ref 0–5)

## 2016-01-20 LAB — PHOSPHORUS: Phosphorus: 2.9 mg/dL (ref 2.5–4.6)

## 2016-01-20 MED ORDER — SODIUM CHLORIDE 0.9 % IV BOLUS (SEPSIS)
1000.0000 mL | Freq: Once | INTRAVENOUS | Status: AC
  Administered 2016-01-20: 1000 mL via INTRAVENOUS

## 2016-01-20 MED ORDER — POTASSIUM CHLORIDE CRYS ER 20 MEQ PO TBCR
40.0000 meq | EXTENDED_RELEASE_TABLET | Freq: Once | ORAL | Status: AC
  Administered 2016-01-20: 40 meq via ORAL
  Filled 2016-01-20: qty 2

## 2016-01-20 MED ORDER — CIPROFLOXACIN IN D5W 400 MG/200ML IV SOLN
400.0000 mg | Freq: Two times a day (BID) | INTRAVENOUS | Status: DC
  Administered 2016-01-21 – 2016-01-23 (×5): 400 mg via INTRAVENOUS
  Filled 2016-01-20 (×5): qty 200

## 2016-01-20 MED ORDER — FERROUS SULFATE 325 (65 FE) MG PO TABS
325.0000 mg | ORAL_TABLET | Freq: Two times a day (BID) | ORAL | Status: DC
Start: 2016-01-20 — End: 2016-01-21
  Administered 2016-01-20 – 2016-01-21 (×2): 325 mg via ORAL
  Filled 2016-01-20 (×2): qty 1

## 2016-01-20 MED ORDER — PRAVASTATIN SODIUM 40 MG PO TABS
40.0000 mg | ORAL_TABLET | Freq: Every day | ORAL | Status: DC
  Administered 2016-01-20: 40 mg via ORAL
  Filled 2016-01-20: qty 1

## 2016-01-20 MED ORDER — MORPHINE SULFATE (PF) 2 MG/ML IV SOLN
2.0000 mg | Freq: Once | INTRAVENOUS | Status: AC
  Administered 2016-01-20: 2 mg via INTRAVENOUS
  Filled 2016-01-20: qty 1

## 2016-01-20 MED ORDER — CIPROFLOXACIN IN D5W 400 MG/200ML IV SOLN
400.0000 mg | Freq: Once | INTRAVENOUS | Status: AC
  Administered 2016-01-20: 400 mg via INTRAVENOUS
  Filled 2016-01-20: qty 200

## 2016-01-20 MED ORDER — INSULIN ASPART 100 UNIT/ML ~~LOC~~ SOLN
0.0000 [IU] | Freq: Three times a day (TID) | SUBCUTANEOUS | Status: DC
  Administered 2016-01-21 (×2): 2 [IU] via SUBCUTANEOUS
  Administered 2016-01-21: 3 [IU] via SUBCUTANEOUS
  Administered 2016-01-22: 7 [IU] via SUBCUTANEOUS
  Administered 2016-01-22: 3 [IU] via SUBCUTANEOUS
  Administered 2016-01-23: 2 [IU] via SUBCUTANEOUS

## 2016-01-20 MED ORDER — HYDROMORPHONE HCL 1 MG/ML IJ SOLN
0.5000 mg | INTRAMUSCULAR | Status: DC | PRN
  Administered 2016-01-20 – 2016-01-21 (×4): 0.5 mg via INTRAVENOUS
  Filled 2016-01-20 (×4): qty 1

## 2016-01-20 MED ORDER — ONDANSETRON HCL 4 MG/2ML IJ SOLN
4.0000 mg | Freq: Once | INTRAMUSCULAR | Status: AC
  Administered 2016-01-20: 4 mg via INTRAVENOUS
  Filled 2016-01-20: qty 2

## 2016-01-20 MED ORDER — IOPAMIDOL (ISOVUE-300) INJECTION 61%
100.0000 mL | Freq: Once | INTRAVENOUS | Status: AC | PRN
  Administered 2016-01-20: 100 mL via INTRAVENOUS

## 2016-01-20 MED ORDER — ASPIRIN 81 MG PO CHEW
81.0000 mg | CHEWABLE_TABLET | Freq: Every day | ORAL | Status: DC
  Administered 2016-01-20: 81 mg via ORAL
  Filled 2016-01-20: qty 1

## 2016-01-20 MED ORDER — DIATRIZOATE MEGLUMINE & SODIUM 66-10 % PO SOLN
ORAL | Status: AC
  Filled 2016-01-20: qty 30

## 2016-01-20 MED ORDER — ENALAPRIL MALEATE 5 MG PO TABS
10.0000 mg | ORAL_TABLET | Freq: Every day | ORAL | Status: DC
  Administered 2016-01-20 – 2016-01-22 (×3): 10 mg via ORAL
  Filled 2016-01-20 (×3): qty 2

## 2016-01-20 MED ORDER — METRONIDAZOLE IN NACL 5-0.79 MG/ML-% IV SOLN
500.0000 mg | Freq: Three times a day (TID) | INTRAVENOUS | Status: DC
  Administered 2016-01-20 – 2016-01-23 (×8): 500 mg via INTRAVENOUS
  Filled 2016-01-20 (×8): qty 100

## 2016-01-20 MED ORDER — ENOXAPARIN SODIUM 60 MG/0.6ML ~~LOC~~ SOLN
60.0000 mg | SUBCUTANEOUS | Status: DC
  Administered 2016-01-20 – 2016-01-22 (×3): 60 mg via SUBCUTANEOUS
  Filled 2016-01-20 (×4): qty 0.6

## 2016-01-20 MED ORDER — VITAMIN B-12 100 MCG PO TABS
100.0000 ug | ORAL_TABLET | Freq: Two times a day (BID) | ORAL | Status: DC
  Administered 2016-01-21 – 2016-01-22 (×4): 100 ug via ORAL
  Filled 2016-01-20 (×7): qty 1

## 2016-01-20 MED ORDER — CIPROFLOXACIN IN D5W 400 MG/200ML IV SOLN: 400.0000 mg | Freq: Two times a day (BID) | INTRAVENOUS | Status: DC

## 2016-01-20 MED ORDER — MORPHINE SULFATE (PF) 4 MG/ML IV SOLN
4.0000 mg | Freq: Once | INTRAVENOUS | Status: AC
Start: 2016-01-20 — End: 2016-01-20
  Administered 2016-01-20: 4 mg via INTRAVENOUS
  Filled 2016-01-20: qty 1

## 2016-01-20 MED ORDER — INSULIN ASPART 100 UNIT/ML ~~LOC~~ SOLN
0.0000 [IU] | Freq: Every day | SUBCUTANEOUS | Status: DC
  Administered 2016-01-22: 2 [IU] via SUBCUTANEOUS

## 2016-01-20 NOTE — ED Provider Notes (Signed)
Medical screening examination/treatment/procedure(s) were conducted as a shared visit with non-physician practitioner(s) and myself.  I personally evaluated the patient during the encounter.   EKG Interpretation  Date/Time:  Monday January 20 2016 10:02:26 EDT Ventricular Rate:  96 PR Interval:    QRS Duration: 94 QT Interval:  345 QTC Calculation: 436 R Axis:   45 Text Interpretation:  Sinus rhythm Low voltage, precordial leads Borderline T abnormalities, lateral leads Baseline wander in lead(s) V2 V3 V5 No previous ECGs available Confirmed by Reyansh Kushnir  MD, Orval Dortch [phone]) on 01/20/2016 12:53:49 PM      Results for orders placed or performed during the hospital encounter of 01/20/16  CBC with Differential  Result Value Ref Range   WBC 16.1 (H) 4.0 - 10.5 K/uL   RBC 3.98 (L) 4.22 - 5.81 MIL/uL   Hemoglobin 11.2 (L) 13.0 - 17.0 g/dL   HCT 34.7 (L) 39.0 - 52.0 %   MCV 87.2 78.0 - 100.0 fL   MCH 28.1 26.0 - 34.0 pg   MCHC 32.3 30.0 - 36.0 g/dL   RDW 13.7 11.5 - 15.5 %   Platelets 255 150 - 400 K/uL   Neutrophils Relative % 86 %   Neutro Abs 13.8 (H) 1.7 - 7.7 K/uL   Lymphocytes Relative 9 %   Lymphs Abs 1.5 0.7 - 4.0 K/uL   Monocytes Relative 5 %   Monocytes Absolute 0.8 0.1 - 1.0 K/uL   Eosinophils Relative 0 %   Eosinophils Absolute 0.0 0.0 - 0.7 K/uL   Basophils Relative 0 %   Basophils Absolute 0.0 0.0 - 0.1 K/uL  Comprehensive metabolic panel  Result Value Ref Range   Sodium 135 135 - 145 mmol/L   Potassium 3.4 (L) 3.5 - 5.1 mmol/L   Chloride 101 101 - 111 mmol/L   CO2 25 22 - 32 mmol/L   Glucose, Bld 233 (H) 65 - 99 mg/dL   BUN 17 6 - 20 mg/dL   Creatinine, Ser 1.18 0.61 - 1.24 mg/dL   Calcium 8.4 (L) 8.9 - 10.3 mg/dL   Total Protein 8.3 (H) 6.5 - 8.1 g/dL   Albumin 4.3 3.5 - 5.0 g/dL   AST 20 15 - 41 U/L   ALT 19 17 - 63 U/L   Alkaline Phosphatase 64 38 - 126 U/L   Total Bilirubin 0.6 0.3 - 1.2 mg/dL   GFR calc non Af Amer >60 >60 mL/min   GFR calc Af Amer >60  >60 mL/min   Anion gap 9 5 - 15  Lipase, blood  Result Value Ref Range   Lipase 59 (H) 11 - 51 U/L   Ct Abdomen Pelvis W Contrast  Result Date: 01/20/2016 CLINICAL DATA:  Nausea and vomiting for 2 days, black stools, diabetes mellitus EXAM: CT ABDOMEN AND PELVIS WITH CONTRAST TECHNIQUE: Multidetector CT imaging of the abdomen and pelvis was performed using the standard protocol following bolus administration of intravenous contrast. CONTRAST:  158m ISOVUE-300 IOPAMIDOL (ISOVUE-300) INJECTION 61% IV. Dilute oral contrast. COMPARISON:  None FINDINGS: Lower chest:  Lung bases clear Hepatobiliary: Liver normal appearance. Gallbladder wall thickening versus pericholecystic fluid. No definite calcified gallstones identified. No biliary dilatation. Pancreas: Normal appearance Spleen: Normal appearance Adrenals/Urinary Tract: Adrenal glands normal appearance. Kidneys, ureters, and urinary bladder normal appearance. Mild prostate gland enlargement 5.7 x 4.2 cm image 91. Stomach/Bowel: Colon predominantly unopacified and under distended. Stomach and bowel loops grossly normal appearance. Normal appendix. Vascular/Lymphatic: Scattered atherosclerotic calcifications aorta. Aorta normal caliber. No adenopathy. Reproductive: N/A Other:  Tiny amount of nonspecific free pelvic fluid. No free air. Tiny umbilical hernia containing fat. Musculoskeletal: No acute osseous findings. Mild scattered degenerative disc and facet disease changes thoracolumbar spine. IMPRESSION: Gallbladder wall thickening versus pericholecystic fluid without definite visualization of gallstones ; followup ultrasound assessment recommended. Aortic atherosclerosis. Otherwise negative exam. Electronically Signed   By: Lavonia Dana M.D.   On: 01/20/2016 12:42   US Abdomen Limited  Result Date: 01/20/2016 CLINICAL DATA:  Nausea vomiting.  Epigastric pain.  Abnormal CT. EXAM: US ABDOMEN LIMITED - RIGHT UPPER QUADRANT COMPARISON:  CT 01/20/2016. FINDINGS:  Gallbladder: Gallbladder is distended and contains sludge and multiple gallstones. Gallbladder wall thickness is 5 mm. This suggest cholecystitis. Common bile duct: Diameter: 6 mm Liver: No focal lesion identified. Within normal limits in parenchymal echogenicity. IMPRESSION: Distended gallbladder containing sludge and multiple gallstones. Gallbladder wall is thickened suggesting associated cholecystitis. Electronically Signed   By: Marcello Moores  Register   On: 01/20/2016 13:43    Patient seen by me. Along with the physician assistant. Patient presents with the complaint of abdominal pain and nausea and vomiting since Saturday. Patient with complaint of epigastric abdominal pain that radiated more to the left lower quadrant. CT scan raise concerns for gallbladder wall thickening. This led to an ultrasound the ultrasound of the gallbladder shows a distended gallbladder containing sludge and multiple gallstones. Gallbladder wall is  thickened suggesting acute cholecystitis.   Patient states that food made the pain worse. He has some mild tenderness epigastric area little bit to the right a little bit to the left. Labs are significant for a leukocytosis. Liver function tests without any significant abnormality.   EKG without any acute changes.   Difficulty explain the patient's persistent pain since Saturday although not typical location for biliary pain in the no significant tenderness over the gallbladder however duration of pain is long enough to certainly cause some acute cholecystitis changes. Does have a leukocytosis. No evidence of any biliary obstruction. Slight increase in the lipase. Nothing else to really explain his pain on CT scan or ultrasound other than the gallbladder. We'll discuss with general surgery. Disposition based on their expertise.    Fredia Sorrow, MD 01/20/16 1430

## 2016-01-20 NOTE — ED Triage Notes (Signed)
Pt reports abd pain,n/v x2 days. Pt reports last BM this am. Pt reports hard black stool this am. nad noted.

## 2016-01-20 NOTE — H&P (Signed)
History and Physical  Gregory Lindsey DJM:[id number] DOB: 04/23/1951 DOA: 01/20/2016  Referring physician: ER Physician PCP: Robert Bellow, MD  Outpatient Specialists:    Patient coming from: Home  Chief Complaint: Abdominal pain, nausea and vomiting   HPI: 65 year old male with history of DM and Hyperlipidemia. Patient presents with 2 day history of abdominal pain, nausea and vomiting. Abdominal pain is said to be paraumbilical. Prior documentation indicated RUQ and epigastric pain. No fever or chills, no headache, no neck pain, no chest pain, no urinary symptoms. CT Scan of the abdomen revealed gall bladder wall thickening. Ultrasound of the abdomen revealed distended gall bladder with sludge and multiple gallstones. The ER Physician has already discussed with the Surgeon on call who asked for Hospitalist service to admit patient.  ED Course: IV antibiotics and pain control  Pertinent labs: As above. Leukocytosis is noted.  Review of Systems: As in HPI. Negative for fever, visual changes, sore throat, rash, new muscle aches, chest pain, SOB, dysuria, bleeding.  Past Medical History:  Diagnosis Date  . Diabetes mellitus   . Hypercholesterolemia     Past Surgical History:  Procedure Laterality Date  . COLONOSCOPY  2007 [facility]   1 CM SIMPLE ADENOMA  . COLONOSCOPY N/A 04/26/2015   Procedure: COLONOSCOPY;  Surgeon: Danie Binder, MD;  Location: [facility];  Service: Endoscopy;  Laterality: N/A;  930      reports that he has never smoked. He has never used smokeless tobacco. He reports that he drinks alcohol. He reports that he does not use drugs.  No Known Allergies  Family History  Problem Relation Age of Onset  . Colon cancer Neg Hx      Prior to Admission medications   Medication Sig Start Date End Date Taking? Authorizing Provider  aspirin 81 MG tablet Take 81 mg by mouth daily.   Yes Historical Provider, MD  cyanocobalamin 1000 MCG tablet Take 100 mcg by mouth 2  (two) times daily.     Yes Historical Provider, MD  enalapril (VASOTEC) 10 MG tablet  03/20/15  Yes Historical Provider, MD  ferrous sulfate (IRON SUPPLEMENT) 325 (65 FE) MG tablet Take 325 mg by mouth 2 (two) times daily.     Yes Historical Provider, MD  ibuprofen (ADVIL,MOTRIN) 800 MG tablet Take 800 mg by mouth every 8 (eight) hours as needed.   Yes Historical Provider, MD  metFORMIN (GLUCOPHAGE-XR) 500 MG 24 hr tablet Take 1 tablet by mouth 2 (two) times daily. 12/16/15  Yes Historical Provider, MD  pravastatin (PRAVACHOL) 40 MG tablet  03/20/15  Yes Historical Provider, MD  sildenafil (REVATIO) 20 MG tablet Take 20 mg by mouth daily as needed.    Yes Historical Provider, MD    Physical Exam: Vitals:   01/20/16 1545 01/20/16 1600 01/20/16 1615 01/20/16 1630  BP:  151/66  164/82  Pulse: 94 88 92 90  Resp: '19 16 19 16  '$ Temp:      TempSrc:      SpO2: 98% 96% 100% 98%  Weight:      Height:         Constitutional:  . Appears calm and comfortable Eyes:  . No pallor. No jaundice.  ENMT:  . external ears, nose appear normal Neck:  . Neck is supple. No JVD Respiratory:  . CTA bilaterally, no w/r/r.  . Respiratory effort normal. No retractions or accessory muscle use Cardiovascular:  . S1S2 . No LE extremity edema   Abdomen:  .  Abdomen is soft and non tender. Organs are difficult to assess. Neurologic:  . Awake and alert. . Moves all limbs.  Wt Readings from Last 3 Encounters:  01/20/16 117.9 kg (260 lb)  04/26/15 122.9 kg (271 lb)  03/27/15 128.5 kg (283 lb 6.4 oz)    I have personally reviewed following labs and imaging studies  Labs on Admission:  CBC:  Recent Labs Lab 01/20/16 1004  WBC 16.1*  NEUTROABS 13.8*  HGB 11.2*  HCT 34.7*  MCV 87.2  PLT 308   Basic Metabolic Panel:  Recent Labs Lab 01/20/16 1004  NA 135  K 3.4*  CL 101  CO2 25  GLUCOSE 233*  BUN 17  CREATININE 1.18  CALCIUM 8.4*   Liver Function Tests:  Recent Labs Lab  01/20/16 1004  AST 20  ALT 19  ALKPHOS 64  BILITOT 0.6  PROT 8.3*  ALBUMIN 4.3    Recent Labs Lab 01/20/16 1004  LIPASE 59*   No results for input(s): AMMONIA in the last 168 hours. Coagulation Profile: No results for input(s): INR, PROTIME in the last 168 hours. Cardiac Enzymes: No results for input(s): CKTOTAL, CKMB, CKMBINDEX, TROPONINI in the last 168 hours. BNP (last 3 results) No results for input(s): PROBNP in the last 8760 hours. HbA1C: No results for input(s): HGBA1C in the last 72 hours. CBG:  Recent Labs Lab 01/20/16 1654  GLUCAP 197*   Lipid Profile: No results for input(s): CHOL, HDL, LDLCALC, TRIG, CHOLHDL, LDLDIRECT in the last 72 hours. Thyroid Function Tests: No results for input(s): TSH, T4TOTAL, FREET4, T3FREE, THYROIDAB in the last 72 hours. Anemia Panel: No results for input(s): VITAMINB12, FOLATE, FERRITIN, TIBC, IRON, RETICCTPCT in the last 72 hours. Urine analysis:    Component Value Date/Time   COLORURINE YELLOW 01/20/2016 1344   APPEARANCEUR CLEAR 01/20/2016 1344   LABSPEC 1.020 01/20/2016 1344   PHURINE 5.0 01/20/2016 1344   GLUCOSEU 100 (A) 01/20/2016 1344   HGBUR NEGATIVE 01/20/2016 1344   BILIRUBINUR NEGATIVE 01/20/2016 1344   KETONESUR NEGATIVE 01/20/2016 1344   PROTEINUR TRACE (A) 01/20/2016 1344   NITRITE NEGATIVE 01/20/2016 1344   LEUKOCYTESUR NEGATIVE 01/20/2016 1344   Sepsis Labs: '@LABRCNTIP'$ (procalcitonin:4,lacticidven:4) )No results found for this or any previous visit (from the past 240 hour(s)).    Radiological Exams on Admission: Ct Abdomen Pelvis W Contrast  Result Date: 01/20/2016 CLINICAL DATA:  Nausea and vomiting for 2 days, black stools, diabetes mellitus EXAM: CT ABDOMEN AND PELVIS WITH CONTRAST TECHNIQUE: Multidetector CT imaging of the abdomen and pelvis was performed using the standard protocol following bolus administration of intravenous contrast. CONTRAST:  162m ISOVUE-300 IOPAMIDOL (ISOVUE-300) INJECTION  61% IV. Dilute oral contrast. COMPARISON:  None FINDINGS: Lower chest:  Lung bases clear Hepatobiliary: Liver normal appearance. Gallbladder wall thickening versus pericholecystic fluid. No definite calcified gallstones identified. No biliary dilatation. Pancreas: Normal appearance Spleen: Normal appearance Adrenals/Urinary Tract: Adrenal glands normal appearance. Kidneys, ureters, and urinary bladder normal appearance. Mild prostate gland enlargement 5.7 x 4.2 cm image 91. Stomach/Bowel: Colon predominantly unopacified and under distended. Stomach and bowel loops grossly normal appearance. Normal appendix. Vascular/Lymphatic: Scattered atherosclerotic calcifications aorta. Aorta normal caliber. No adenopathy. Reproductive: N/A Other: Tiny amount of nonspecific free pelvic fluid. No free air. Tiny umbilical hernia containing fat. Musculoskeletal: No acute osseous findings. Mild scattered degenerative disc and facet disease changes thoracolumbar spine. IMPRESSION: Gallbladder wall thickening versus pericholecystic fluid without definite visualization of gallstones ; followup ultrasound assessment recommended. Aortic atherosclerosis. Otherwise negative exam. Electronically Signed  By: Lavonia Dana M.D.   On: 01/20/2016 12:42   US Abdomen Limited  Result Date: 01/20/2016 CLINICAL DATA:  Nausea vomiting.  Epigastric pain.  Abnormal CT. EXAM: US ABDOMEN LIMITED - RIGHT UPPER QUADRANT COMPARISON:  CT 01/20/2016. FINDINGS: Gallbladder: Gallbladder is distended and contains sludge and multiple gallstones. Gallbladder wall thickness is 5 mm. This suggest cholecystitis. Common bile duct: Diameter: 6 mm Liver: No focal lesion identified. Within normal limits in parenchymal echogenicity. IMPRESSION: Distended gallbladder containing sludge and multiple gallstones. Gallbladder wall is thickened suggesting associated cholecystitis. Electronically Signed   By: [place]   On: 01/20/2016 13:43    Active Problems:    Cholecystitis   Acute cholecystitis   Assessment/Plan 1. Acute gallstone cholecystitis 2. Diabetes Mellitus 3. Hyperlipidemia 4. Mild Hypokalemia, likely related to nausea and vomiting 5. Volume depletion   Admit patient  IV antibiotics  Surgery input  Optimize Blood sugar control  IV fluid  Minimal Po intake  Monitor and replace abnormal electrolytes  DVT prophylaxis: [place] Lovenox Code Status: Full Family Communication: Wife Disposition Plan: Home eventually   Consults called: ER Physician has already consulted the Surgical team   Admission status: Inpatient    Time spent: Greater than 60 minutes  Dana Allan, MD  [facility] Pager #: [phone] 7PM-7AM contact night coverage as above   01/20/2016, 5:19 PM

## 2016-01-20 NOTE — ED Provider Notes (Signed)
[place] DEPT Provider Note   CSN: [id number] Arrival date & time: 01/20/16  6387  First Provider Contact:  First MD Initiated Contact with Patient 01/20/16 0940        History   Chief Complaint Chief Complaint  Patient presents with  . Abdominal Pain    HPI Gregory Lindsey is a 65 y.o. male.  HPI  Gregory Lindsey is a 65 y.o. male who presents to the Emergency Department complaining of left upper and epigastric abdominal pain associated with nausea and vomiting.  Symptoms began two days ago.  He describes an aching pain to his stomach.  Pain and vomiting are worse with food intake.  Nothing makes the symptoms better.  He denies fever, diarrhea, chest pain, shortness of breath or back pain.  He also complains of dark, hard stools but he currently takes iron supplements and has been having regular BM's.      Past Medical History:  Diagnosis Date  . Diabetes mellitus   . Hypercholesterolemia     Patient Active Problem List   Diagnosis Date Noted  . History of colonic polyps 03/27/2015    Past Surgical History:  Procedure Laterality Date  . COLONOSCOPY  2007 [facility]   1 CM SIMPLE ADENOMA  . COLONOSCOPY N/A 04/26/2015   Procedure: COLONOSCOPY;  Surgeon: Danie Binder, MD;  Location: [facility];  Service: Endoscopy;  Laterality: N/A;  930        Home Medications    Prior to Admission medications   Medication Sig Start Date End Date Taking? Authorizing Provider  aspirin 81 MG tablet Take 81 mg by mouth daily.   Yes Historical Provider, MD  cyanocobalamin 1000 MCG tablet Take 100 mcg by mouth 2 (two) times daily.     Yes Historical Provider, MD  enalapril (VASOTEC) 10 MG tablet  03/20/15  Yes Historical Provider, MD  ferrous sulfate (IRON SUPPLEMENT) 325 (65 FE) MG tablet Take 325 mg by mouth 2 (two) times daily.     Yes Historical Provider, MD  ibuprofen (ADVIL,MOTRIN) 800 MG tablet Take 800 mg by mouth every 8 (eight) hours as needed.   Yes Historical  Provider, MD  metFORMIN (GLUCOPHAGE-XR) 500 MG 24 hr tablet Take 1 tablet by mouth 2 (two) times daily. 12/16/15  Yes Historical Provider, MD  pravastatin (PRAVACHOL) 40 MG tablet  03/20/15  Yes Historical Provider, MD  sildenafil (REVATIO) 20 MG tablet Take 20 mg by mouth daily as needed.    Yes Historical Provider, MD    Family History Family History  Problem Relation Age of Onset  . Colon cancer Neg Hx     Social History Social History  Substance Use Topics  . Smoking status: Never Smoker  . Smokeless tobacco: Never Used  . Alcohol use 0.0 oz/week     Comment: beer 2-3 times per week, 2 shots of scotch on saturday night      Allergies   Review of patient's allergies indicates no known allergies.   Review of Systems Review of Systems  Constitutional: Positive for appetite change. Negative for chills and fever.  HENT: Negative for sore throat.   Respiratory: Negative for shortness of breath.   Cardiovascular: Negative for chest pain.  Gastrointestinal: Positive for abdominal pain, nausea and vomiting. Negative for blood in stool.  Genitourinary: Negative for decreased urine volume, difficulty urinating, dysuria and flank pain.  Musculoskeletal: Negative for back pain.  Skin: Negative for color change and rash.  Neurological: Negative for dizziness, weakness  and numbness.  Hematological: Negative for adenopathy.  All other systems reviewed and are negative.    Physical Exam Updated Vital Signs BP 155/71   Pulse 98   Temp 98.2 F (36.8 C) (Oral)   Resp 26   Ht '5\' 8"'$  (1.727 m)   Wt 117.9 kg   SpO2 98%   BMI 39.53 kg/m   Physical Exam  Constitutional: He is oriented to person, place, and time. He appears well-developed and well-nourished. No distress.  HENT:  Head: Normocephalic and atraumatic.  Mouth/Throat: Oropharynx is clear and moist.  Cardiovascular: Normal rate, regular rhythm, normal heart sounds and intact distal pulses.   No murmur  heard. Pulmonary/Chest: Effort normal and breath sounds normal. No respiratory distress.  Abdominal: Soft. Bowel sounds are normal. He exhibits no distension and no mass. There is tenderness. There is no rebound and no guarding.  Mild to moderate tenderness of the epigastric and LUQ abdomen.  No guarding, rebound or peritoneal signs.   Musculoskeletal: Normal range of motion. He exhibits no edema.  Neurological: He is alert and oriented to person, place, and time. He exhibits normal muscle tone. Coordination normal.  Skin: Skin is warm and dry.  Nursing note and vitals reviewed.    ED Treatments / Results  Labs (all labs ordered are listed, but only abnormal results are displayed) Labs Reviewed  CBC WITH DIFFERENTIAL/PLATELET - Abnormal; Notable for the following:       Result Value   WBC 16.1 (*)    RBC 3.98 (*)    Hemoglobin 11.2 (*)    HCT 34.7 (*)    Neutro Abs 13.8 (*)    All other components within normal limits  COMPREHENSIVE METABOLIC PANEL - Abnormal; Notable for the following:    Potassium 3.4 (*)    Glucose, Bld 233 (*)    Calcium 8.4 (*)    Total Protein 8.3 (*)    All other components within normal limits  LIPASE, BLOOD - Abnormal; Notable for the following:    Lipase 59 (*)    All other components within normal limits  URINALYSIS, ROUTINE W REFLEX MICROSCOPIC (NOT AT [facility])    EKG  EKG Interpretation  Date/Time:  Monday January 20 2016 10:02:26 EDT Ventricular Rate:  96 PR Interval:    QRS Duration: 94 QT Interval:  345 QTC Calculation: 436 R Axis:   45 Text Interpretation:  Sinus rhythm Low voltage, precordial leads Borderline T abnormalities, lateral leads Baseline wander in lead(s) V2 V3 V5 No previous ECGs available Confirmed by ZACKOWSKI  MD, SCOTT [phone]) on 01/20/2016 12:53:49 PM       Radiology Ct Abdomen Pelvis W Contrast  Result Date: 01/20/2016 CLINICAL DATA:  Nausea and vomiting for 2 days, black stools, diabetes mellitus EXAM: CT ABDOMEN AND  PELVIS WITH CONTRAST TECHNIQUE: Multidetector CT imaging of the abdomen and pelvis was performed using the standard protocol following bolus administration of intravenous contrast. CONTRAST:  145m ISOVUE-300 IOPAMIDOL (ISOVUE-300) INJECTION 61% IV. Dilute oral contrast. COMPARISON:  None FINDINGS: Lower chest:  Lung bases clear Hepatobiliary: Liver normal appearance. Gallbladder wall thickening versus pericholecystic fluid. No definite calcified gallstones identified. No biliary dilatation. Pancreas: Normal appearance Spleen: Normal appearance Adrenals/Urinary Tract: Adrenal glands normal appearance. Kidneys, ureters, and urinary bladder normal appearance. Mild prostate gland enlargement 5.7 x 4.2 cm image 91. Stomach/Bowel: Colon predominantly unopacified and under distended. Stomach and bowel loops grossly normal appearance. Normal appendix. Vascular/Lymphatic: Scattered atherosclerotic calcifications aorta. Aorta normal caliber. No adenopathy. Reproductive: N/A Other:  Tiny amount of nonspecific free pelvic fluid. No free air. Tiny umbilical hernia containing fat. Musculoskeletal: No acute osseous findings. Mild scattered degenerative disc and facet disease changes thoracolumbar spine. IMPRESSION: Gallbladder wall thickening versus pericholecystic fluid without definite visualization of gallstones ; followup ultrasound assessment recommended. Aortic atherosclerosis. Otherwise negative exam. Electronically Signed   By: Lavonia Dana M.D.   On: 01/20/2016 12:42   US Abdomen Limited  Result Date: 01/20/2016 CLINICAL DATA:  Nausea vomiting.  Epigastric pain.  Abnormal CT. EXAM: US ABDOMEN LIMITED - RIGHT UPPER QUADRANT COMPARISON:  CT 01/20/2016. FINDINGS: Gallbladder: Gallbladder is distended and contains sludge and multiple gallstones. Gallbladder wall thickness is 5 mm. This suggest cholecystitis. Common bile duct: Diameter: 6 mm Liver: No focal lesion identified. Within normal limits in parenchymal echogenicity.  IMPRESSION: Distended gallbladder containing sludge and multiple gallstones. Gallbladder wall is thickened suggesting associated cholecystitis. Electronically Signed   By: Marcello Moores  Register   On: 01/20/2016 13:43    Procedures Procedures (including critical care time)  Medications Ordered in ED Medications  diatrizoate meglumine-sodium (GASTROGRAFIN) 66-10 % solution (not administered)  ondansetron (ZOFRAN) injection 4 mg (4 mg Intravenous Given 01/20/16 1001)  morphine 4 MG/ML injection 4 mg (4 mg Intravenous Given 01/20/16 1001)  sodium chloride 0.9 % bolus 1,000 mL (1,000 mLs Intravenous New Bag/Given 01/20/16 1001)  morphine 2 MG/ML injection 2 mg (2 mg Intravenous Given 01/20/16 1135)  iopamidol (ISOVUE-300) 61 % injection 100 mL (100 mLs Intravenous Contrast Given 01/20/16 1158)     Initial Impression / Assessment and Plan / ED Course  I have reviewed the triage vital signs and the nursing notes.  Pertinent labs & imaging results that were available during my care of the patient were reviewed by me and considered in my medical decision making (see chart for details).  Clinical Course   Pt is non-toxic appearing.  No active vomiting in the ED.  Vitals stable.  CT and [place] are concerning for cholecystitis although on physical exam, pain appears to be epigastric and LUQ.    Pain improved after IV morphine and zofran.  Will consult Dr. Arnoldo Morale for admit  45  Consulted Dr. Arnoldo Morale who is currently in OR, info relayed to Dr. Arnoldo Morale by OR nurse.  He requests hospitalist admit and he will consult  1450  Dr. Arnoldo Morale kindly called back.  Reviewed imaging.  Recommends hospitalist admit and IV Cipro and clear liquids.  Will see in am.    Roff Dr. Marthenia Rolling.  Will admit to med-surg  Final Clinical Impressions(s) / ED Diagnoses   Final diagnoses:  Cholecystitis    New Prescriptions New Prescriptions   No medications on file     Kem Parkinson, PA-C 01/20/16 1628

## 2016-01-21 ENCOUNTER — Inpatient Hospital Stay (HOSPITAL_COMMUNITY): Payer: BLUE CROSS/BLUE SHIELD

## 2016-01-21 DIAGNOSIS — K819 Cholecystitis, unspecified: Secondary | ICD-10-CM

## 2016-01-21 LAB — GLUCOSE, CAPILLARY
Glucose-Capillary: 201 mg/dL — ABNORMAL HIGH (ref 65–99)
Glucose-Capillary: 188 mg/dL — ABNORMAL HIGH (ref 65–99)
Glucose-Capillary: 189 mg/dL — ABNORMAL HIGH (ref 65–99)
Glucose-Capillary: 157 mg/dL — ABNORMAL HIGH (ref 65–99)

## 2016-01-21 LAB — COMPREHENSIVE METABOLIC PANEL
ALT: 253 U/L — ABNORMAL HIGH (ref 17–63)
AST: 355 U/L — ABNORMAL HIGH (ref 15–41)
Albumin: 3.6 g/dL (ref 3.5–5.0)
Alkaline Phosphatase: 158 U/L — ABNORMAL HIGH (ref 38–126)
Anion gap: 9 (ref 5–15)
BUN: 13 mg/dL (ref 6–20)
CO2: 26 mmol/L (ref 22–32)
Calcium: 8.2 mg/dL — ABNORMAL LOW (ref 8.9–10.3)
Chloride: 100 mmol/L — ABNORMAL LOW (ref 101–111)
Creatinine, Ser: 1.18 mg/dL (ref 0.61–1.24)
GFR calc Af Amer: >60 mL/min (ref >60)
GFR calc non Af Amer: >60 mL/min (ref >60)
Glucose, Bld: 211 mg/dL — ABNORMAL HIGH (ref 65–99)
Potassium: 3.7 mmol/L (ref 3.5–5.1)
Sodium: 135 mmol/L (ref 135–145)
Total Bilirubin: 1 mg/dL (ref 0.3–1.2)
Total Protein: 7.3 g/dL (ref 6.5–8.1)

## 2016-01-21 LAB — CBC
HCT: 32 % — ABNORMAL LOW (ref 39.0–52.0)
Hemoglobin: 10.5 g/dL — ABNORMAL LOW (ref 13.0–17.0)
MCH: 28.4 pg (ref 26.0–34.0)
MCHC: 32.8 g/dL (ref 30.0–36.0)
MCV: 86.5 fL (ref 78.0–100.0)
Platelets: 242 10*3/uL (ref 150–400)
RBC: 3.7 MIL/uL — ABNORMAL LOW (ref 4.22–5.81)
RDW: 13.7 % (ref 11.5–15.5)
WBC: 16.6 10*3/uL — ABNORMAL HIGH (ref 4.0–10.5)

## 2016-01-21 LAB — SURGICAL PCR SCREEN
MRSA, PCR: NEGATIVE
Staphylococcus aureus: NEGATIVE

## 2016-01-21 MED ORDER — KCL IN DEXTROSE-NACL 20-5-0.45 MEQ/L-%-% IV SOLN
INTRAVENOUS | Status: DC
  Administered 2016-01-21 – 2016-01-22 (×2): via INTRAVENOUS

## 2016-01-21 MED ORDER — CHLORHEXIDINE GLUCONATE CLOTH 2 % EX PADS
6.0000 | MEDICATED_PAD | Freq: Once | CUTANEOUS | Status: AC
  Administered 2016-01-22: 6 via TOPICAL

## 2016-01-21 MED ORDER — CHLORHEXIDINE GLUCONATE CLOTH 2 % EX PADS
6.0000 | MEDICATED_PAD | Freq: Once | CUTANEOUS | Status: AC
  Administered 2016-01-21: 6 via TOPICAL

## 2016-01-21 NOTE — Progress Notes (Signed)
[facility] PROGRESS NOTE  Gregory Lindsey Rufer QIW:[id number] DOB: [birth year]    PCP:   Robert Bellow, MD   HPI:  Patient with hx of DM and HLD presented to the ER with abdominal pain.  CT Scan of the abdomen revealed gall bladder wall thickening. Ultrasound of the abdomen revealed distended gall bladder with sludge and multiple gallstones.  He was started on IV antibiotics, and surgery planned to take him to the OR for CCY tomorrow morning for acute cholecystitis and cholelithiasis.    Rewiew of Systems:  Constitutional: Negative for malaise, fever and chills. No significant weight loss or weight gain Eyes: Negative for eye pain, redness and discharge, diplopia, visual changes, or flashes of light. ENMT: Negative for ear pain, hoarseness, nasal congestion, sinus pressure and sore throat. No headaches; tinnitus, drooling, or problem swallowing. Cardiovascular: Negative for chest pain, palpitations, diaphoresis, dyspnea and peripheral edema. ; No orthopnea, PND Respiratory: Negative for cough, hemoptysis, wheezing and stridor. No pleuritic chestpain. Gastrointestinal: Negative for nausea, vomiting, diarrhea, constipation, abdominal pain, melena, blood in stool, hematemesis, jaundice and rectal bleeding.    Genitourinary: Negative for frequency, dysuria, incontinence,flank pain and hematuria; Musculoskeletal: Negative for back pain and neck pain. Negative for swelling and trauma.;  Skin: . Negative for pruritus, rash, abrasions, bruising and skin lesion.; ulcerations Neuro: Negative for headache, lightheadedness and neck stiffness. Negative for weakness, altered level of consciousness , altered mental status, extremity weakness, burning feet, involuntary movement, seizure and syncope.  Psych: negative for anxiety, depression, insomnia, tearfulness, panic attacks, hallucinations, paranoia, suicidal or homicidal ideation    Past Medical History:  Diagnosis Date  . Diabetes mellitus   .  Hypercholesterolemia     Past Surgical History:  Procedure Laterality Date  . COLONOSCOPY  2007 [facility]F   1 CM SIMPLE ADENOMA  . COLONOSCOPY N/A 04/26/2015   Procedure: COLONOSCOPY;  Surgeon: Danie Binder, MD;  Location: [facility];  Service: Endoscopy;  Laterality: N/A;  930     Medications:  HOME MEDS: Prior to Admission medications   Medication Sig Start Date End Date Taking? Authorizing Provider  aspirin 81 MG tablet Take 81 mg by mouth daily.   Yes Historical Provider, MD  cyanocobalamin 1000 MCG tablet Take 100 mcg by mouth 2 (two) times daily.     Yes Historical Provider, MD  enalapril (VASOTEC) 10 MG tablet  03/20/15  Yes Historical Provider, MD  ferrous sulfate (IRON SUPPLEMENT) 325 (65 FE) MG tablet Take 325 mg by mouth 2 (two) times daily.     Yes Historical Provider, MD  ibuprofen (ADVIL,MOTRIN) 800 MG tablet Take 800 mg by mouth every 8 (eight) hours as needed.   Yes Historical Provider, MD  metFORMIN (GLUCOPHAGE-XR) 500 MG 24 hr tablet Take 1 tablet by mouth 2 (two) times daily. 12/16/15  Yes Historical Provider, MD  pravastatin (PRAVACHOL) 40 MG tablet  03/20/15  Yes Historical Provider, MD  sildenafil (REVATIO) 20 MG tablet Take 20 mg by mouth daily as needed.    Yes Historical Provider, MD     Allergies:  No Known Allergies  Social History:   reports that he has never smoked. He has never used smokeless tobacco. He reports that he drinks alcohol. He reports that he does not use drugs.  Family History: Family History  Problem Relation Age of Onset  . Colon cancer Neg Hx      Physical Exam: Vitals:   01/20/16 1630 01/20/16 1726 01/20/16 2157 01/21/16 0607  BP: 164/82 Marland Kitchen)  164/66 (!) 154/72 (!) 163/67  Pulse: 90 86 90 91  Resp: '16 18 20 20  '$ Temp:  98.7 F (37.1 C) 100 F (37.8 C) 99.3 F (37.4 C)  TempSrc:  Oral Oral Oral  SpO2: 98% 99% 100% 100%  Weight:  119.5 kg (263 lb 8 oz)    Height:  '5\' 8"'$  (1.727 m)     Blood pressure (!) 163/67, pulse 91,  temperature 99.3 F (37.4 C), temperature source Oral, resp. rate 20, height '5\' 8"'$  (1.727 m), weight 119.5 kg (263 lb 8 oz), SpO2 100 %.  GEN:  Pleasant patient lying in the stretcher in no acute distress; cooperative with exam. PSYCH:  alert and oriented x4; does not appear anxious or depressed; affect is appropriate. HEENT: Mucous membranes pink and anicteric; PERRLA; EOM intact; no cervical lymphadenopathy nor thyromegaly or carotid bruit; no JVD; There were no stridor. Neck is very supple. Breasts:: Not examined CHEST WALL: No tenderness CHEST: Normal respiration, clear to auscultation bilaterally.  HEART: Regular rate and rhythm.  There are no murmur, rub, or gallops.   BACK: No kyphosis or scoliosis; no CVA tenderness ABDOMEN: soft and non-tender; no masses, no organomegaly, normal abdominal bowel sounds; no pannus; no intertriginous candida. There is no rebound and no distention. Rectal Exam: Not done EXTREMITIES: No bone or joint deformity; age-appropriate arthropathy of the hands and knees; no edema; no ulcerations.  There is no calf tenderness. Genitalia: not examined PULSES: 2+ and symmetric SKIN: Normal hydration no rash or ulceration CNS: Cranial nerves 2-12 grossly intact no focal lateralizing neurologic deficit.  Speech is fluent; uvula elevated with phonation, facial symmetry and tongue midline. DTR are normal bilaterally, cerebella exam is intact, barbinski is negative and strengths are equaled bilaterally.  No sensory loss.   Labs on Admission:  Basic Metabolic Panel:  Recent Labs Lab 01/20/16 1004 01/21/16 0550  NA 135 135  K 3.4* 3.7  CL 101 100*  CO2 25 26  GLUCOSE 233* 211*  BUN 17 13  CREATININE 1.18 1.18  CALCIUM 8.4* 8.2*  MG 1.7  --   PHOS 2.9  --    Liver Function Tests:  Recent Labs Lab 01/20/16 1004 01/21/16 0550  AST 20 355*  ALT 19 253*  ALKPHOS 64 158*  BILITOT 0.6 1.0  PROT 8.3* 7.3  ALBUMIN 4.3 3.6    Recent Labs Lab 01/20/16 1004   LIPASE 59*   No results for input(s): AMMONIA in the last 168 hours. CBC:  Recent Labs Lab 01/20/16 1004 01/21/16 0550  WBC 16.1* 16.6*  NEUTROABS 13.8*  --   HGB 11.2* 10.5*  HCT 34.7* 32.0*  MCV 87.2 86.5  PLT 255 242    CBG:  Recent Labs Lab 01/20/16 1654 01/20/16 2202 01/21/16 0804 01/21/16 1129  GLUCAP 197* 162* 201* 188*     Radiological Exams on Admission: Dg Chest 2 View  Result Date: 01/21/2016 CLINICAL DATA:  Preop evaluation prior to cholecystectomy. EXAM: CHEST  2 VIEW COMPARISON:  11/22/2007. FINDINGS: Normal sized heart. Clear lungs. Thoracic spine degenerative changes. IMPRESSION: No acute abnormality. Electronically Signed   By: Claudie Revering M.D.   On: 01/21/2016 10:01   Ct Abdomen Pelvis W Contrast  Result Date: 01/20/2016 CLINICAL DATA:  Nausea and vomiting for 2 days, black stools, diabetes mellitus EXAM: CT ABDOMEN AND PELVIS WITH CONTRAST TECHNIQUE: Multidetector CT imaging of the abdomen and pelvis was performed using the standard protocol following bolus administration of intravenous contrast. CONTRAST:  175m ISOVUE-300 IOPAMIDOL (  ISOVUE-300) INJECTION 61% IV. Dilute oral contrast. COMPARISON:  None FINDINGS: Lower chest:  Lung bases clear Hepatobiliary: Liver normal appearance. Gallbladder wall thickening versus pericholecystic fluid. No definite calcified gallstones identified. No biliary dilatation. Pancreas: Normal appearance Spleen: Normal appearance Adrenals/Urinary Tract: Adrenal glands normal appearance. Kidneys, ureters, and urinary bladder normal appearance. Mild prostate gland enlargement 5.7 x 4.2 cm image 91. Stomach/Bowel: Colon predominantly unopacified and under distended. Stomach and bowel loops grossly normal appearance. Normal appendix. Vascular/Lymphatic: Scattered atherosclerotic calcifications aorta. Aorta normal caliber. No adenopathy. Reproductive: N/A Other: Tiny amount of nonspecific free pelvic fluid. No free air. Tiny umbilical  hernia containing fat. Musculoskeletal: No acute osseous findings. Mild scattered degenerative disc and facet disease changes thoracolumbar spine. IMPRESSION: Gallbladder wall thickening versus pericholecystic fluid without definite visualization of gallstones ; followup ultrasound assessment recommended. Aortic atherosclerosis. Otherwise negative exam. Electronically Signed   By: Lavonia Dana M.D.   On: 01/20/2016 12:42   US Abdomen Limited  Result Date: 01/20/2016 CLINICAL DATA:  Nausea vomiting.  Epigastric pain.  Abnormal CT. EXAM: US ABDOMEN LIMITED - RIGHT UPPER QUADRANT COMPARISON:  CT 01/20/2016. FINDINGS: Gallbladder: Gallbladder is distended and contains sludge and multiple gallstones. Gallbladder wall thickness is 5 mm. This suggest cholecystitis. Common bile duct: Diameter: 6 mm Liver: No focal lesion identified. Within normal limits in parenchymal echogenicity. IMPRESSION: Distended gallbladder containing sludge and multiple gallstones. Gallbladder wall is thickened suggesting associated cholecystitis. Electronically Signed   By: Marcello Moores  Register   On: 01/20/2016 13:43   Assessment/Plan Present on Admission: . Cholecystitis . Acute cholecystitis  PLAN:    Acute cholecystitis and cholelithiasis:  Continue with IV antibiotics, NPO, IVF.  Plan for CCY tomorrow morning per Dr Arnoldo Morale.  Hold statin.  HTN:   Continue with ACE I.    DM:  Continue with SSI.    Other plans as per orders. Code Status: FULL Haskel Khan, MD.  FACP [facility] Pager [phone] 7pm to 7am.  01/21/2016, 1:17 PM

## 2016-01-21 NOTE — Consult Note (Signed)
Reason for Consult: Right upper quadrant abdominal pain Referring Physician: Dr. Gale Journey Gregory Lindsey is an 65 y.o. male.  HPI: Patient is a 65 year old black male with a history of hypercholesterolemia and diabetes mellitus who presented emergency room with several day history of worsening upper abdominal pain. Initially it appeared to be in the left upper quadrant, but is now settled in the epigastric region. He presented to the emergency room due to worsening nausea and vomiting. His pain was not severe, but constant. It was relieved with belching. He denies any fatty food intolerance, fever, chills, or jaundice. CT scan the abdomen and ultrasound both confirm acute cholecystitis secondary to cholelithiasis. He states that since his admission, his pain has somewhat eased with pain medication. His nausea is under better control.  Past Medical History:  Diagnosis Date  . Diabetes mellitus   . Hypercholesterolemia     Past Surgical History:  Procedure Laterality Date  . COLONOSCOPY  2007 [facility]   1 CM SIMPLE ADENOMA  . COLONOSCOPY N/A 04/26/2015   Procedure: COLONOSCOPY;  Surgeon: Danie Binder, MD;  Location: [facility];  Service: Endoscopy;  Laterality: N/A;  930     Family History  Problem Relation Age of Onset  . Colon cancer Neg Hx     Social History:  reports that he has never smoked. He has never used smokeless tobacco. He reports that he drinks alcohol. He reports that he does not use drugs.  Allergies: No Known Allergies  Medications:  Scheduled: . aspirin  81 mg Oral Daily  . ciprofloxacin  400 mg Intravenous Q12H  . enalapril  10 mg Oral Daily  . enoxaparin (LOVENOX) injection  60 mg Subcutaneous Q24H  . ferrous sulfate  325 mg Oral BID  . insulin aspart  0-5 Units Subcutaneous QHS  . insulin aspart  0-9 Units Subcutaneous TID WC  . metronidazole  500 mg Intravenous Q8H  . pravastatin  40 mg Oral q1800  . cyanocobalamin  100 mcg Oral BID   Continuous:    Results for orders placed or performed during the hospital encounter of 01/20/16 (from the past 48 hour(s))  CBC with Differential     Status: Abnormal   Collection Time: 01/20/16 10:04 AM  Result Value Ref Range   WBC 16.1 (H) 4.0 - 10.5 K/uL   RBC 3.98 (L) 4.22 - 5.81 MIL/uL   Hemoglobin 11.2 (L) 13.0 - 17.0 g/dL   HCT 34.7 (L) 39.0 - 52.0 %   MCV 87.2 78.0 - 100.0 fL   MCH 28.1 26.0 - 34.0 pg   MCHC 32.3 30.0 - 36.0 g/dL   RDW 13.7 11.5 - 15.5 %   Platelets 255 150 - 400 K/uL   Neutrophils Relative % 86 %   Neutro Abs 13.8 (H) 1.7 - 7.7 K/uL   Lymphocytes Relative 9 %   Lymphs Abs 1.5 0.7 - 4.0 K/uL   Monocytes Relative 5 %   Monocytes Absolute 0.8 0.1 - 1.0 K/uL   Eosinophils Relative 0 %   Eosinophils Absolute 0.0 0.0 - 0.7 K/uL   Basophils Relative 0 %   Basophils Absolute 0.0 0.0 - 0.1 K/uL  Comprehensive metabolic panel     Status: Abnormal   Collection Time: 01/20/16 10:04 AM  Result Value Ref Range   Sodium 135 135 - 145 mmol/L   Potassium 3.4 (L) 3.5 - 5.1 mmol/L   Chloride 101 101 - 111 mmol/L   CO2 25 22 - 32 mmol/L  Glucose, Bld 233 (H) 65 - 99 mg/dL   BUN 17 6 - 20 mg/dL   Creatinine, Ser 1.18 0.61 - 1.24 mg/dL   Calcium 8.4 (L) 8.9 - 10.3 mg/dL   Total Protein 8.3 (H) 6.5 - 8.1 g/dL   Albumin 4.3 3.5 - 5.0 g/dL   AST 20 15 - 41 U/L   ALT 19 17 - 63 U/L   Alkaline Phosphatase 64 38 - 126 U/L   Total Bilirubin 0.6 0.3 - 1.2 mg/dL   GFR calc non Af Amer >60 >60 mL/min   GFR calc Af Amer >60 >60 mL/min    Comment: (NOTE) The eGFR has been calculated using the CKD EPI equation. This calculation has not been validated in all clinical situations. eGFR's persistently <60 mL/min signify possible Chronic Kidney Disease.    Anion gap 9 5 - 15  Lipase, blood     Status: Abnormal   Collection Time: 01/20/16 10:04 AM  Result Value Ref Range   Lipase 59 (H) 11 - 51 U/L  Magnesium     Status: None   Collection Time: 01/20/16 10:04 AM  Result Value Ref Range    Magnesium 1.7 1.7 - 2.4 mg/dL  Phosphorus     Status: None   Collection Time: 01/20/16 10:04 AM  Result Value Ref Range   Phosphorus 2.9 2.5 - 4.6 mg/dL  Urinalysis, Routine w reflex microscopic (not at [facility])     Status: Abnormal   Collection Time: 01/20/16  1:44 PM  Result Value Ref Range   Color, Urine YELLOW YELLOW   APPearance CLEAR CLEAR   Specific Gravity, Urine 1.020 1.005 - 1.030   pH 5.0 5.0 - 8.0   Glucose, UA 100 (A) NEGATIVE mg/dL   Hgb urine dipstick NEGATIVE NEGATIVE   Bilirubin Urine NEGATIVE NEGATIVE   Ketones, ur NEGATIVE NEGATIVE mg/dL   Protein, ur TRACE (A) NEGATIVE mg/dL   Nitrite NEGATIVE NEGATIVE   Leukocytes, UA NEGATIVE NEGATIVE  Urine microscopic-add on     Status: Abnormal   Collection Time: 01/20/16  1:44 PM  Result Value Ref Range   Squamous Epithelial / LPF 0-5 (A) NONE SEEN   WBC, UA 0-5 0 - 5 WBC/hpf   RBC / HPF NONE SEEN 0 - 5 RBC/hpf   Bacteria, UA NONE SEEN NONE SEEN  CBG monitoring, ED     Status: Abnormal   Collection Time: 01/20/16  4:54 PM  Result Value Ref Range   Glucose-Capillary 197 (H) 65 - 99 mg/dL   Comment 1 Notify RN   Glucose, capillary     Status: Abnormal   Collection Time: 01/20/16 10:02 PM  Result Value Ref Range   Glucose-Capillary 162 (H) 65 - 99 mg/dL   Comment 1 Notify RN    Comment 2 Document in Chart   Comprehensive metabolic panel     Status: Abnormal   Collection Time: 01/21/16  5:50 AM  Result Value Ref Range   Sodium 135 135 - 145 mmol/L   Potassium 3.7 3.5 - 5.1 mmol/L   Chloride 100 (L) 101 - 111 mmol/L   CO2 26 22 - 32 mmol/L   Glucose, Bld 211 (H) 65 - 99 mg/dL   BUN 13 6 - 20 mg/dL   Creatinine, Ser 1.18 0.61 - 1.24 mg/dL   Calcium 8.2 (L) 8.9 - 10.3 mg/dL   Total Protein 7.3 6.5 - 8.1 g/dL   Albumin 3.6 3.5 - 5.0 g/dL   AST 355 (H) 15 - 41 U/L     ALT 253 (H) 17 - 63 U/L   Alkaline Phosphatase 158 (H) 38 - 126 U/L   Total Bilirubin 1.0 0.3 - 1.2 mg/dL   GFR calc non Af Amer >60 >60 mL/min    GFR calc Af Amer >60 >60 mL/min    Comment: (NOTE) The eGFR has been calculated using the CKD EPI equation. This calculation has not been validated in all clinical situations. eGFR's persistently <60 mL/min signify possible Chronic Kidney Disease.    Anion gap 9 5 - 15  CBC     Status: Abnormal   Collection Time: 01/21/16  5:50 AM  Result Value Ref Range   WBC 16.6 (H) 4.0 - 10.5 K/uL   RBC 3.70 (L) 4.22 - 5.81 MIL/uL   Hemoglobin 10.5 (L) 13.0 - 17.0 g/dL   HCT 32.0 (L) 39.0 - 52.0 %   MCV 86.5 78.0 - 100.0 fL   MCH 28.4 26.0 - 34.0 pg   MCHC 32.8 30.0 - 36.0 g/dL   RDW 13.7 11.5 - 15.5 %   Platelets 242 150 - 400 K/uL    Ct Abdomen Pelvis W Contrast  Result Date: 01/20/2016 CLINICAL DATA:  Nausea and vomiting for 2 days, black stools, diabetes mellitus EXAM: CT ABDOMEN AND PELVIS WITH CONTRAST TECHNIQUE: Multidetector CT imaging of the abdomen and pelvis was performed using the standard protocol following bolus administration of intravenous contrast. CONTRAST:  100mL ISOVUE-300 IOPAMIDOL (ISOVUE-300) INJECTION 61% IV. Dilute oral contrast. COMPARISON:  None FINDINGS: Lower chest:  Lung bases clear Hepatobiliary: Liver normal appearance. Gallbladder wall thickening versus pericholecystic fluid. No definite calcified gallstones identified. No biliary dilatation. Pancreas: Normal appearance Spleen: Normal appearance Adrenals/Urinary Tract: Adrenal glands normal appearance. Kidneys, ureters, and urinary bladder normal appearance. Mild prostate gland enlargement 5.7 x 4.2 cm image 91. Stomach/Bowel: Colon predominantly unopacified and under distended. Stomach and bowel loops grossly normal appearance. Normal appendix. Vascular/Lymphatic: Scattered atherosclerotic calcifications aorta. Aorta normal caliber. No adenopathy. Reproductive: N/A Other: Tiny amount of nonspecific free pelvic fluid. No free air. Tiny umbilical hernia containing fat. Musculoskeletal: No acute osseous findings. Mild  scattered degenerative disc and facet disease changes thoracolumbar spine. IMPRESSION: Gallbladder wall thickening versus pericholecystic fluid without definite visualization of gallstones ; followup ultrasound assessment recommended. Aortic atherosclerosis. Otherwise negative exam. Electronically Signed   By: Mark  Boles M.D.   On: 01/20/2016 12:42   Us Abdomen Limited  Result Date: 01/20/2016 CLINICAL DATA:  Nausea vomiting.  Epigastric pain.  Abnormal CT. EXAM: US ABDOMEN LIMITED - RIGHT UPPER QUADRANT COMPARISON:  CT 01/20/2016. FINDINGS: Gallbladder: Gallbladder is distended and contains sludge and multiple gallstones. Gallbladder wall thickness is 5 mm. This suggest cholecystitis. Common bile duct: Diameter: 6 mm Liver: No focal lesion identified. Within normal limits in parenchymal echogenicity. IMPRESSION: Distended gallbladder containing sludge and multiple gallstones. Gallbladder wall is thickened suggesting associated cholecystitis. Electronically Signed   By: Hari  Register   On: 01/20/2016 13:43    ROS:  Pertinent items noted in HPI and remainder of comprehensive ROS otherwise negative.  Blood pressure (!) 163/67, pulse 91, temperature 99.3 F (37.4 C), temperature source Oral, resp. rate 20, height 5' 8" (1.727 m), weight 119.5 kg (263 lb 8 oz), SpO2 100 %. Physical Exam: Pleasant black male in no acute distress. Head is normocephalic, atraumatic. Neck is supple without lymphadenopathy or carotid bruits. Lungs clear to auscultation with equal breath sounds bilaterally. Heart examination reveals a regular rate and rhythm without S3, S4, murmurs. The abdomen is soft with minimal tenderness   in the right upper quadrant to palpation. No hepatosplenomegaly, masses, or hernias are appreciated. No rigidity is noted.  Assessment/Plan: Impression: Acute cholecystitis, cholelithiasis. Hyperglycemia secondary to diabetes mellitus. Hypertension. Plan: Would continue IV ciprofloxacin as  ordered. Will proceed with laparoscopic cholecystectomy tomorrow. The risks and benefits of the procedure including bleeding, infection, hepatobiliary injury, the possibility of an open procedure were fully explained to the patient, who gave informed consent.  JENKINS,MARK A 01/21/2016, 7:45 AM     

## 2016-01-22 ENCOUNTER — Encounter (HOSPITAL_COMMUNITY): Payer: Self-pay | Admitting: Anesthesiology

## 2016-01-22 ENCOUNTER — Inpatient Hospital Stay (HOSPITAL_COMMUNITY): Payer: BLUE CROSS/BLUE SHIELD | Admitting: Anesthesiology

## 2016-01-22 ENCOUNTER — Encounter (HOSPITAL_COMMUNITY)
Admission: EM | Disposition: A | Discharge status: Home / Self Care | Payer: Self-pay | Source: Home / Self Care | Attending: Internal Medicine

## 2016-01-22 DIAGNOSIS — I10 Essential (primary) hypertension: Secondary | ICD-10-CM

## 2016-01-22 DIAGNOSIS — E118 Type 2 diabetes mellitus with unspecified complications: Secondary | ICD-10-CM

## 2016-01-22 DIAGNOSIS — K810 Acute cholecystitis: Secondary | ICD-10-CM

## 2016-01-22 HISTORY — PX: CHOLECYSTECTOMY: SHX55

## 2016-01-22 LAB — BASIC METABOLIC PANEL
Anion gap: 8 (ref 5–15)
BUN: 10 mg/dL (ref 6–20)
CALCIUM: 8.1 mg/dL — AB (ref 8.9–10.3)
CHLORIDE: 103 mmol/L (ref 101–111)
CO2: 25 mmol/L (ref 22–32)
CREATININE: 1.09 mg/dL (ref 0.61–1.24)
GFR calc non Af Amer: >60 mL/min (ref >60)
Glucose, Bld: 202 mg/dL — ABNORMAL HIGH (ref 65–99)
Potassium: 3.3 mmol/L — ABNORMAL LOW (ref 3.5–5.1)
SODIUM: 136 mmol/L (ref 135–145)

## 2016-01-22 LAB — GLUCOSE, CAPILLARY
GLUCOSE-CAPILLARY: 223 mg/dL — AB (ref 65–99)
GLUCOSE-CAPILLARY: 213 mg/dL — AB (ref 65–99)
GLUCOSE-CAPILLARY: 231 mg/dL — AB (ref 65–99)
Glucose-Capillary: 319 mg/dL — ABNORMAL HIGH (ref 65–99)
Glucose-Capillary: 235 mg/dL — ABNORMAL HIGH (ref 65–99)

## 2016-01-22 LAB — CBC
HCT: 29.6 % — ABNORMAL LOW (ref 39.0–52.0)
Hemoglobin: 10 g/dL — ABNORMAL LOW (ref 13.0–17.0)
MCH: 28.7 pg (ref 26.0–34.0)
MCHC: 33.8 g/dL (ref 30.0–36.0)
MCV: 85.1 fL (ref 78.0–100.0)
PLATELETS: 234 10*3/uL (ref 150–400)
RBC: 3.48 MIL/uL — AB (ref 4.22–5.81)
RDW: 13.4 % (ref 11.5–15.5)
WBC: 14.4 10*3/uL — ABNORMAL HIGH (ref 4.0–10.5)

## 2016-01-22 LAB — HEPATIC FUNCTION PANEL
ALBUMIN: 3.2 g/dL — AB (ref 3.5–5.0)
ALK PHOS: 120 U/L (ref 38–126)
ALT: 141 U/L — ABNORMAL HIGH (ref 17–63)
AST: 63 U/L — AB (ref 15–41)
BILIRUBIN DIRECT: 0.1 mg/dL (ref 0.1–0.5)
BILIRUBIN TOTAL: 0.7 mg/dL (ref 0.3–1.2)
Indirect Bilirubin: 0.6 mg/dL (ref 0.3–0.9)
Total Protein: 6.8 g/dL (ref 6.5–8.1)

## 2016-01-22 SURGERY — LAPAROSCOPIC CHOLECYSTECTOMY
Anesthesia: General

## 2016-01-22 MED ORDER — GLYCOPYRROLATE 0.2 MG/ML IJ SOLN
INTRAMUSCULAR | Status: AC
  Filled 2016-01-22: qty 3

## 2016-01-22 MED ORDER — POVIDONE-IODINE 10 % OINT PACKET
TOPICAL_OINTMENT | CUTANEOUS | Status: DC | PRN
  Administered 2016-01-22: 1 via TOPICAL

## 2016-01-22 MED ORDER — HEMOSTATIC AGENTS (NO CHARGE) OPTIME
TOPICAL | Status: DC | PRN
  Administered 2016-01-22: 1 via TOPICAL

## 2016-01-22 MED ORDER — LORAZEPAM 2 MG/ML IJ SOLN: 1.0000 mg | INTRAMUSCULAR | Status: DC | PRN

## 2016-01-22 MED ORDER — FENTANYL CITRATE (PF) 100 MCG/2ML IJ SOLN
INTRAMUSCULAR | Status: AC
  Filled 2016-01-22: qty 2

## 2016-01-22 MED ORDER — ONDANSETRON HCL 4 MG/2ML IJ SOLN
INTRAMUSCULAR | Status: AC
  Filled 2016-01-22: qty 2

## 2016-01-22 MED ORDER — SUCCINYLCHOLINE CHLORIDE 20 MG/ML IJ SOLN
INTRAMUSCULAR | Status: DC | PRN
  Administered 2016-01-22: 140 mg via INTRAVENOUS

## 2016-01-22 MED ORDER — KETOROLAC TROMETHAMINE 30 MG/ML IJ SOLN
30.0000 mg | Freq: Once | INTRAMUSCULAR | Status: AC
  Administered 2016-01-22: 30 mg via INTRAVENOUS
  Filled 2016-01-22: qty 1

## 2016-01-22 MED ORDER — DEXAMETHASONE SODIUM PHOSPHATE 4 MG/ML IJ SOLN
INTRAMUSCULAR | Status: AC
  Filled 2016-01-22: qty 1

## 2016-01-22 MED ORDER — DEXAMETHASONE SODIUM PHOSPHATE 4 MG/ML IJ SOLN
INTRAMUSCULAR | Status: DC | PRN
  Administered 2016-01-22: 8 mg via INTRAVENOUS

## 2016-01-22 MED ORDER — LIDOCAINE HCL (PF) 1 % IJ SOLN
INTRAMUSCULAR | Status: AC
Start: 2016-01-22 — End: 2016-01-22
  Filled 2016-01-22: qty 5

## 2016-01-22 MED ORDER — LIDOCAINE HCL (CARDIAC) 20 MG/ML IV SOLN
INTRAVENOUS | Status: DC | PRN
  Administered 2016-01-22: 50 mg via INTRAVENOUS

## 2016-01-22 MED ORDER — DIPHENHYDRAMINE HCL 50 MG/ML IJ SOLN: 12.5000 mg | Freq: Four times a day (QID) | INTRAMUSCULAR | Status: DC | PRN

## 2016-01-22 MED ORDER — BUPIVACAINE HCL (PF) 0.5 % IJ SOLN
INTRAMUSCULAR | Status: DC | PRN
  Administered 2016-01-22: 10 mL

## 2016-01-22 MED ORDER — ROCURONIUM BROMIDE 50 MG/5ML IV SOLN
INTRAVENOUS | Status: AC
  Filled 2016-01-22: qty 1

## 2016-01-22 MED ORDER — FENTANYL CITRATE (PF) 250 MCG/5ML IJ SOLN
INTRAMUSCULAR | Status: AC
  Filled 2016-01-22: qty 5

## 2016-01-22 MED ORDER — PHENYLEPHRINE 40 MCG/ML (10ML) SYRINGE FOR IV PUSH (FOR BLOOD PRESSURE SUPPORT)
PREFILLED_SYRINGE | INTRAVENOUS | Status: AC
  Filled 2016-01-22: qty 10

## 2016-01-22 MED ORDER — BUPIVACAINE HCL (PF) 0.5 % IJ SOLN
INTRAMUSCULAR | Status: AC
  Filled 2016-01-22: qty 30

## 2016-01-22 MED ORDER — NEOSTIGMINE METHYLSULFATE 10 MG/10ML IV SOLN
INTRAVENOUS | Status: DC | PRN
  Administered 2016-01-22: 4 mg via INTRAVENOUS

## 2016-01-22 MED ORDER — FENTANYL CITRATE (PF) 100 MCG/2ML IJ SOLN
25.0000 ug | INTRAMUSCULAR | Status: DC | PRN
  Administered 2016-01-22 (×4): 50 ug via INTRAVENOUS

## 2016-01-22 MED ORDER — ROCURONIUM BROMIDE 100 MG/10ML IV SOLN
INTRAVENOUS | Status: DC | PRN
  Administered 2016-01-22: 20 mg via INTRAVENOUS
  Administered 2016-01-22: 10 mg via INTRAVENOUS

## 2016-01-22 MED ORDER — FENTANYL CITRATE (PF) 100 MCG/2ML IJ SOLN
INTRAMUSCULAR | Status: DC | PRN
  Administered 2016-01-22 (×4): 50 ug via INTRAVENOUS

## 2016-01-22 MED ORDER — SODIUM CHLORIDE 0.9 % IR SOLN
Status: DC | PRN
  Administered 2016-01-22: 1000 mL

## 2016-01-22 MED ORDER — GLYCOPYRROLATE 0.2 MG/ML IJ SOLN
INTRAMUSCULAR | Status: DC | PRN
  Administered 2016-01-22: 0.2 mg via INTRAVENOUS
  Administered 2016-01-22: 0.6 mg via INTRAVENOUS

## 2016-01-22 MED ORDER — DEXAMETHASONE SODIUM PHOSPHATE 4 MG/ML IJ SOLN
4.0000 mg | Freq: Once | INTRAMUSCULAR | Status: AC
Start: 2016-01-22 — End: 2016-01-22
  Administered 2016-01-22: 4 mg via INTRAVENOUS
  Filled 2016-01-22: qty 1

## 2016-01-22 MED ORDER — ONDANSETRON HCL 4 MG/2ML IJ SOLN
4.0000 mg | Freq: Once | INTRAMUSCULAR | Status: AC | PRN
  Administered 2016-01-22: 4 mg via INTRAVENOUS

## 2016-01-22 MED ORDER — ONDANSETRON 4 MG PO TBDP: 4.0000 mg | ORAL_TABLET | Freq: Four times a day (QID) | ORAL | Status: DC | PRN

## 2016-01-22 MED ORDER — ONDANSETRON HCL 4 MG/2ML IJ SOLN
4.0000 mg | Freq: Once | INTRAMUSCULAR | Status: AC
  Administered 2016-01-22: 4 mg via INTRAVENOUS
  Filled 2016-01-22: qty 2

## 2016-01-22 MED ORDER — ONDANSETRON HCL 4 MG/2ML IJ SOLN: 4.0000 mg | Freq: Four times a day (QID) | INTRAMUSCULAR | Status: DC | PRN

## 2016-01-22 MED ORDER — LACTATED RINGERS IV SOLN
INTRAVENOUS | Status: DC
  Administered 2016-01-22: 16:00:00 via INTRAVENOUS

## 2016-01-22 MED ORDER — SIMETHICONE 80 MG PO CHEW: 40.0000 mg | CHEWABLE_TABLET | Freq: Four times a day (QID) | ORAL | Status: DC | PRN

## 2016-01-22 MED ORDER — ACETAMINOPHEN 650 MG RE SUPP: 650.0000 mg | Freq: Four times a day (QID) | RECTAL | Status: DC | PRN

## 2016-01-22 MED ORDER — LACTATED RINGERS IV SOLN
INTRAVENOUS | Status: DC
  Administered 2016-01-22 (×2): via INTRAVENOUS

## 2016-01-22 MED ORDER — NEOSTIGMINE METHYLSULFATE 10 MG/10ML IV SOLN
INTRAVENOUS | Status: AC
  Filled 2016-01-22: qty 1

## 2016-01-22 MED ORDER — METFORMIN HCL 500 MG PO TABS
500.0000 mg | ORAL_TABLET | Freq: Two times a day (BID) | ORAL | Status: DC
  Administered 2016-01-22 – 2016-01-23 (×2): 500 mg via ORAL
  Filled 2016-01-22 (×2): qty 1

## 2016-01-22 MED ORDER — PROPOFOL 10 MG/ML IV BOLUS
INTRAVENOUS | Status: AC
  Filled 2016-01-22: qty 40

## 2016-01-22 MED ORDER — PANTOPRAZOLE SODIUM 40 MG PO TBEC
40.0000 mg | DELAYED_RELEASE_TABLET | Freq: Every day | ORAL | Status: DC
  Administered 2016-01-22: 40 mg via ORAL
  Filled 2016-01-22: qty 1

## 2016-01-22 MED ORDER — FENTANYL CITRATE (PF) 100 MCG/2ML IJ SOLN: 25.0000 ug | INTRAMUSCULAR | Status: DC | PRN

## 2016-01-22 MED ORDER — ONDANSETRON HCL 4 MG/2ML IJ SOLN
4.0000 mg | Freq: Four times a day (QID) | INTRAMUSCULAR | Status: DC | PRN
  Administered 2016-01-22: 4 mg via INTRAVENOUS
  Filled 2016-01-22: qty 2

## 2016-01-22 MED ORDER — OXYCODONE-ACETAMINOPHEN 5-325 MG PO TABS: 1.0000 | ORAL_TABLET | ORAL | Status: DC | PRN

## 2016-01-22 MED ORDER — PHENYLEPHRINE HCL 10 MG/ML IJ SOLN
INTRAMUSCULAR | Status: DC | PRN
Start: 2016-01-22 — End: 2016-01-22
  Administered 2016-01-22: 80 ug via INTRAVENOUS

## 2016-01-22 MED ORDER — POVIDONE-IODINE 10 % EX OINT
TOPICAL_OINTMENT | CUTANEOUS | Status: AC
  Filled 2016-01-22: qty 1

## 2016-01-22 MED ORDER — SUCCINYLCHOLINE CHLORIDE 20 MG/ML IJ SOLN
INTRAMUSCULAR | Status: AC
  Filled 2016-01-22: qty 1

## 2016-01-22 MED ORDER — MIDAZOLAM HCL 2 MG/2ML IJ SOLN
INTRAMUSCULAR | Status: AC
  Filled 2016-01-22: qty 2

## 2016-01-22 MED ORDER — DIPHENHYDRAMINE HCL 12.5 MG/5ML PO ELIX: 12.5000 mg | ORAL_SOLUTION | Freq: Four times a day (QID) | ORAL | Status: DC | PRN

## 2016-01-22 MED ORDER — ACETAMINOPHEN 325 MG PO TABS: 650.0000 mg | ORAL_TABLET | Freq: Four times a day (QID) | ORAL | Status: DC | PRN

## 2016-01-22 MED ORDER — ONDANSETRON HCL 4 MG/2ML IJ SOLN: 4.0000 mg | Freq: Once | INTRAMUSCULAR | Status: DC | PRN

## 2016-01-22 MED ORDER — MIDAZOLAM HCL 2 MG/2ML IJ SOLN
1.0000 mg | INTRAMUSCULAR | Status: DC | PRN
  Administered 2016-01-22: 2 mg via INTRAVENOUS
  Filled 2016-01-22: qty 2

## 2016-01-22 SURGICAL SUPPLY — 51 items
APL SRG 38 LTWT LNG FL B (MISCELLANEOUS) ×1
APPLICATOR ARISTA FLEXITIP XL (MISCELLANEOUS) ×2 IMPLANT
APPLIER CLIP LAPSCP 10X32 DD (CLIP) ×3 IMPLANT
BAG HAMPER (MISCELLANEOUS) ×3 IMPLANT
BAG SPEC RTRVL LRG 6X4 10 (ENDOMECHANICALS) ×1
CHLORAPREP W/TINT 26ML (MISCELLANEOUS) ×3 IMPLANT
CLOTH BEACON ORANGE TIMEOUT ST (SAFETY) ×3 IMPLANT
COVER LIGHT HANDLE STERIS (MISCELLANEOUS) ×6 IMPLANT
DECANTER SPIKE VIAL GLASS SM (MISCELLANEOUS) ×3 IMPLANT
ELECT REM PT RETURN 9FT ADLT (ELECTROSURGICAL) ×3
ELECTRODE REM PT RTRN 9FT ADLT (ELECTROSURGICAL) ×1 IMPLANT
FILTER SMOKE EVAC LAPAROSHD (FILTER) ×3 IMPLANT
FORMALIN 10 PREFIL 120ML (MISCELLANEOUS) ×3 IMPLANT
GLOVE BIOGEL PI IND STRL 7.0 (GLOVE) ×1 IMPLANT
GLOVE BIOGEL PI IND STRL 7.5 (GLOVE) IMPLANT
GLOVE BIOGEL PI INDICATOR 7.0 (GLOVE) ×2
GLOVE BIOGEL PI INDICATOR 7.5 (GLOVE) ×2
GLOVE ECLIPSE 6.5 STRL STRAW (GLOVE) ×2 IMPLANT
GLOVE ECLIPSE 7.0 STRL STRAW (GLOVE) ×2 IMPLANT
GLOVE EXAM NITRILE PF MED BLUE (GLOVE) ×2 IMPLANT
GLOVE SURG SS PI 7.5 STRL IVOR (GLOVE) ×3 IMPLANT
GOWN STRL REUS W/ TWL XL LVL3 (GOWN DISPOSABLE) ×1 IMPLANT
GOWN STRL REUS W/TWL LRG LVL3 (GOWN DISPOSABLE) ×6 IMPLANT
GOWN STRL REUS W/TWL XL LVL3 (GOWN DISPOSABLE) ×3
HEMOSTAT ARISTA ABSORB 3G PWDR (MISCELLANEOUS) ×2 IMPLANT
HEMOSTAT SNOW SURGICEL 2X4 (HEMOSTASIS) ×3 IMPLANT
INST SET LAPROSCOPIC AP (KITS) ×3 IMPLANT
IV NS IRRIG 3000ML ARTHROMATIC (IV SOLUTION) IMPLANT
KIT ROOM TURNOVER APOR (KITS) ×3 IMPLANT
MANIFOLD NEPTUNE II (INSTRUMENTS) ×3 IMPLANT
NDL INSUFFLATION 14GA 120MM (NEEDLE) ×1 IMPLANT
NEEDLE INSUFFLATION 14GA 120MM (NEEDLE) ×3 IMPLANT
NS IRRIG 1000ML POUR BTL (IV SOLUTION) ×3 IMPLANT
PACK LAP CHOLE LZT030E (CUSTOM PROCEDURE TRAY) ×3 IMPLANT
PAD ARMBOARD 7.5X6 YLW CONV (MISCELLANEOUS) ×3 IMPLANT
POUCH SPECIMEN RETRIEVAL 10MM (ENDOMECHANICALS) ×3 IMPLANT
SET BASIN LINEN APH (SET/KITS/TRAYS/PACK) ×3 IMPLANT
SET TUBE IRRIG SUCTION NO TIP (IRRIGATION / IRRIGATOR) IMPLANT
SLEEVE ENDOPATH XCEL 5M (ENDOMECHANICALS) ×3 IMPLANT
SPONGE GAUZE 2X2 8PLY STER LF (GAUZE/BANDAGES/DRESSINGS) ×4
SPONGE GAUZE 2X2 8PLY STRL LF (GAUZE/BANDAGES/DRESSINGS) ×8 IMPLANT
STAPLER VISISTAT (STAPLE) ×3 IMPLANT
SUT VICRYL 0 UR6 27IN ABS (SUTURE) ×3 IMPLANT
TAPE CLOTH SURG 4X10 WHT LF (GAUZE/BANDAGES/DRESSINGS) ×2 IMPLANT
TROCAR ENDO BLADELESS 11MM (ENDOMECHANICALS) ×3 IMPLANT
TROCAR XCEL NON-BLD 5MMX100MML (ENDOMECHANICALS) ×3 IMPLANT
TROCAR XCEL UNIV SLVE 11M 100M (ENDOMECHANICALS) ×3 IMPLANT
TUBE CONNECTING 12'X1/4 (SUCTIONS) ×1
TUBE CONNECTING 12X1/4 (SUCTIONS) ×2 IMPLANT
TUBING INSUFFLATION (TUBING) ×5 IMPLANT
WARMER LAPAROSCOPE (MISCELLANEOUS) ×3 IMPLANT

## 2016-01-22 NOTE — Anesthesia Postprocedure Evaluation (Signed)
Anesthesia Post Note  Patient: Gregory Lindsey  Procedure(s) Performed: Procedure(s) (LRB): LAPAROSCOPIC CHOLECYSTECTOMY (N/A)  Patient location during evaluation: PACU Anesthesia Type: General Level of consciousness: awake and alert and oriented Pain management: pain level controlled Vital Signs Assessment: post-procedure vital signs reviewed and stable Respiratory status: spontaneous breathing Cardiovascular status: stable Postop Assessment: no signs of nausea or vomiting Anesthetic complications: no    Last Vitals:  Vitals:   01/22/16 1025 01/22/16 1143  BP: 117/68   Pulse:  98  Resp: 16   Temp:      Last Pain:  Vitals:   01/22/16 1156  TempSrc:   PainSc: 4                  ADAMS, AMY A

## 2016-01-22 NOTE — Op Note (Signed)
Patient:  Gregory Lindsey  DOB:  12-26-1950  MRN:  [id number]   Preop Diagnosis:  Acute cholecystitis, cholelithiasis  Postop Diagnosis:  Same  Procedure:  Laparoscopic cholecystectomy  Surgeon:  Aviva Signs, M.D.  Assistant: Tama High, M.D.  Anes:  Gen. endotracheal  Indications:  Patient is a 65 year old black male who presents with acute cholecystitis secondary to cholelithiasis. The risks and benefits of the procedure including bleeding, infection, hepatobiliary injury, and the possibility of an open procedure were fully explained to the patient, who gave informed consent.  Procedure note:  The patient was placed the supine position. After induction of general endotracheal anesthesia, the abdomen was prepped and draped using the usual sterile technique with DuraPrep. Surgical site confirmation was performed.  A supraumbilical incision was made down to the fascia. A Veress needle was introduced into the abdominal cavity and confirmation of placement was done using the saline drop test. The abdomen was then insufflated to 16 mmHg pressure. An 11 mm trocar was introduced into the abdominal cavity under direct visualization without difficulty. The patient was placed in reverse Trendelenburg position and an additional 11 mm trocar was placed the epigastric region and 5 mm trochars were placed the right upper quadrant and right flank regions. Liver was inspected and noted within normal limits. The gallbladder was noted be distended, tense, with a thickened gallbladder wall. There was decompressed and aspirated nor to facilitate the dissection. The gallbladder was retracted in a dynamic fashion in order to provide a critical view of the triangle of Calot. The cystic duct was first identified. Its juncture to the infundibulum was fully identified. Endoclips were placed proximally and distally on the cystic duct, and the cystic duct was divided. This was likewise done cystic artery. The  gallbladder was freed away from the gallbladder fossa using Bovie electrocautery. The gallbladder was delivered through the epigastric trocar site using an Endo Catch bag. The gallbladder fossa was inspected no abnormal bleeding or bile leakage was noted. Both Arista and Surgicel placed in the gallbladder fossa. All fluid and air were then evacuated from the abdominal cavity prior to removal of the trochars.  All wounds were irrigated with normal saline. All wounds were injected with 0.5% Sensorcaine. The supraumbilical fascia as well as epigastric fascia reapproximated using 0 Vicryl interrupted sutures. All skin incisions were closed using staples. Betadine ointment and dry sterile dressings were applied.  All tape and needle counts were correct at the end of the procedure. The patient was extubated in the operating room and transferred to PACU in stable condition.  Complications:  None  EBL:  Minimal  Specimen:  Gallbladder

## 2016-01-22 NOTE — Anesthesia Procedure Notes (Signed)
Procedure Name: Intubation Date/Time: 01/22/2016 10:36 AM Performed by: Andree Elk, AMY A Pre-anesthesia Checklist: Patient identified, Patient being monitored, Timeout performed, Emergency Drugs available and Suction available Patient Re-evaluated:Patient Re-evaluated prior to inductionOxygen Delivery Method: Circle system utilized Preoxygenation: Pre-oxygenation with 100% oxygen Intubation Type: IV induction, Rapid sequence and Cricoid Pressure applied Laryngoscope Size: 3 and Miller Grade View: Grade I Tube type: Oral Tube size: 7.0 mm Number of attempts: 1 Airway Equipment and Method: Stylet Placement Confirmation: ETT inserted through vocal cords under direct vision,  positive ETCO2 and breath sounds checked- equal and bilateral Secured at: 21 cm Tube secured with: Tape Dental Injury: Teeth and Oropharynx as per pre-operative assessment

## 2016-01-22 NOTE — Progress Notes (Signed)
Dr. Roderic Palau called with new order for Zofran '4mg'$  q6 hrs PRN for nausea and vomiting.

## 2016-01-22 NOTE — Transfer of Care (Signed)
Immediate Anesthesia Transfer of Care Note  Patient: Gregory Lindsey July  Procedure(s) Performed: Procedure(s): LAPAROSCOPIC CHOLECYSTECTOMY (N/A)  Patient Location: PACU  Anesthesia Type:General  Level of Consciousness: awake, oriented and patient cooperative  Airway & Oxygen Therapy: Patient Spontanous Breathing and Patient connected to face mask oxygen  Post-op Assessment: Report given to RN and Post -op Vital signs reviewed and stable  Post vital signs: Reviewed and stable  Last Vitals:  Vitals:   01/22/16 1020 01/22/16 1025  BP: 117/69 117/68  Pulse:    Resp: (!) 0 16  Temp:      Last Pain:  Vitals:   01/22/16 0905  TempSrc: Oral  PainSc: 7       Patients Stated Pain Goal: 8 (92/42/68 [id number])  Complications: No apparent anesthesia complications

## 2016-01-22 NOTE — Progress Notes (Signed)
Dr Patsey Berthold notified of fingerstick blood sugar of 231. No new orders given.

## 2016-01-22 NOTE — Anesthesia Preprocedure Evaluation (Signed)
Anesthesia Evaluation  Patient identified by MRN, date of birth, ID band Patient awake    Reviewed: Allergy & Precautions, NPO status , Patient's Chart, lab work & pertinent test results  Airway Mallampati: II  TM Distance: >3 FB     Dental  (+) Edentulous Upper, Edentulous Lower   Pulmonary neg pulmonary ROS,    breath sounds clear to auscultation       Cardiovascular hypertension, negative cardio ROS   Rhythm:Regular Rate:Normal     Neuro/Psych    GI/Hepatic Acute cholecystitis   Endo/Other  diabetes, Poorly Controlled, Type 2Morbid obesity  Renal/GU      Musculoskeletal   Abdominal   Peds  Hematology   Anesthesia Other Findings   Reproductive/Obstetrics                            Anesthesia Physical Anesthesia Plan  ASA: III  Anesthesia Plan: General   Post-op Pain Management:    Induction: Intravenous, Rapid sequence and Cricoid pressure planned  Airway Management Planned: Oral ETT  Additional Equipment:   Intra-op Plan:   Post-operative Plan: Extubation in OR  Informed Consent: I have reviewed the patients History and Physical, chart, labs and discussed the procedure including the risks, benefits and alternatives for the proposed anesthesia with the patient or authorized representative who has indicated his/her understanding and acceptance.     Plan Discussed with:   Anesthesia Plan Comments:         Anesthesia Quick Evaluation

## 2016-01-22 NOTE — Progress Notes (Signed)
PROGRESS NOTE    Gregory Lindsey  XNA:[id number] DOB: May 19, 1951 DOA: 01/20/2016 PCP: Robert Bellow, MD Outpatient Specialists:  Gastroenterology; Danie Binder   Brief Narrative:  65 year old male with a hx of DM and HLD presented with complaints of abdominal pain. CT scan of the abdomen revealed gall bladder wall thickening. [place] abd revealed gall bladder distention with sludge and gallstones. The patient was started on IV antibiotics. Surgery has been consulted and plans on CCY later today for acute cholecystitis and cholelithiasis.    Assessment & Plan:   Active Problems:   Cholecystitis   Acute cholecystitis  1. Acute cholecystitis and cholelithiasis. General surgery has been consulted. CCY performed today no immediate complications noted. Continue current treatments now. Further post  op care per general surgery.  2. HTN. Continue ACE inhibitors 3. DM. Continue SSI. Restart Metformin.  DVT prophylaxis: Lovenox Code Status: Full Family Communication: discussed with patient. Wife present at bedside. Disposition Plan: Discharge home in 24 hours    Consultants:   General surgery  Procedures:   Cholecystectomy 8/9  Antimicrobials:    Cipro 8/7 >>  Flagyl 8/7>>   Subjective: Feels well. Pain is controlled. No vomiting.  Objective: Vitals:   01/22/16 1025 01/22/16 1143 01/22/16 1230 01/22/16 1240  BP: 117/68 (!) 160/72  140/77  Pulse:  98  96  Resp: '16 16 12 18  '$ Temp:    98 F (36.7 C)  TempSrc:    Oral  SpO2: 100% 95%  99%  Weight:      Height:        Intake/Output Summary (Last 24 hours) at 01/22/16 1303 Last data filed at 01/22/16 1144  Gross per 24 hour  Intake             4250 ml  Output              505 ml  Net             3745 ml   Filed Weights   01/20/16 0934 01/20/16 1726 01/22/16 0905  Weight: 117.9 kg (260 lb) 119.5 kg (263 lb 8 oz) 119.7 kg (264 lb)    Examination:  General exam: Appears calm and comfortable  Respiratory  system: Clear to auscultation. Respiratory effort normal. Cardiovascular system: S1 & S2 heard, RRR. No JVD, murmurs, rubs, gallops or clicks. No pedal edema. Gastrointestinal system: Abdomen is diffusely tender, nondistended, soft. No organomegaly or masses felt. Normal bowel sounds heard. Central nervous system: Alert and oriented. No focal neurological deficits. Extremities: Symmetric 5 x 5 power. Skin: No rashes, lesions or ulcers Psychiatry: Judgement and insight appear normal. Mood & affect appropriate.     Data Reviewed: I have personally reviewed following labs and imaging studies  CBC:  Recent Labs Lab 01/20/16 1004 01/21/16 0550 01/22/16 0544  WBC 16.1* 16.6* 14.4*  NEUTROABS 13.8*  --   --   HGB 11.2* 10.5* 10.0*  HCT 34.7* 32.0* 29.6*  MCV 87.2 86.5 85.1  PLT 255 242 542   Basic Metabolic Panel:  Recent Labs Lab 01/20/16 1004 01/21/16 0550 01/22/16 0544  NA 135 135 136  K 3.4* 3.7 3.3*  CL 101 100* 103  CO2 '25 26 25  '$ GLUCOSE 233* 211* 202*  BUN '17 13 10  '$ CREATININE 1.18 1.18 1.09  CALCIUM 8.4* 8.2* 8.1*  MG 1.7  --   --   PHOS 2.9  --   --    GFR: Estimated Creatinine Clearance: 88.5 mL/min (  by C-G formula based on SCr of 1.09 mg/dL). Liver Function Tests:  Recent Labs Lab 01/20/16 1004 01/21/16 0550 01/22/16 0544  AST 20 355* 63*  ALT 19 253* 141*  ALKPHOS 64 158* 120  BILITOT 0.6 1.0 0.7  PROT 8.3* 7.3 6.8  ALBUMIN 4.3 3.6 3.2*    Recent Labs Lab 01/20/16 1004  LIPASE 59*   CBG:  Recent Labs Lab 01/21/16 1639 01/21/16 2033 01/22/16 0735 01/22/16 0922 01/22/16 1149  GLUCAP 189* 157* 223* 213* 231*   Lipid Profile: No results for input(s): CHOL, HDL, LDLCALC, TRIG, CHOLHDL, LDLDIRECT in the last 72 hours. Thyroid Function Tests: No results for input(s): TSH, T4TOTAL, FREET4, T3FREE, THYROIDAB in the last 72 hours. Anemia Panel: No results for input(s): VITAMINB12, FOLATE, FERRITIN, TIBC, IRON, RETICCTPCT in the last 72  hours. Urine analysis:    Component Value Date/Time   COLORURINE YELLOW 01/20/2016 1344   APPEARANCEUR CLEAR 01/20/2016 1344   LABSPEC 1.020 01/20/2016 1344   PHURINE 5.0 01/20/2016 1344   GLUCOSEU 100 (A) 01/20/2016 1344   HGBUR NEGATIVE 01/20/2016 1344   BILIRUBINUR NEGATIVE 01/20/2016 1344   KETONESUR NEGATIVE 01/20/2016 1344   PROTEINUR TRACE (A) 01/20/2016 1344   NITRITE NEGATIVE 01/20/2016 1344   LEUKOCYTESUR NEGATIVE 01/20/2016 1344   Sepsis Labs: '@LABRCNTIP'$ (procalcitonin:4,lacticidven:4)  ) Recent Results (from the past 240 hour(s))  Surgical pcr screen     Status: None   Collection Time: 01/21/16  6:55 PM  Result Value Ref Range Status   MRSA, PCR NEGATIVE NEGATIVE Final   Staphylococcus aureus NEGATIVE NEGATIVE Final    Comment:        The Xpert SA Assay (FDA approved for NASAL specimens in patients over 57 years of age), is one component of a comprehensive surveillance program.  Test performance has been validated by [facility] for patients greater than or equal to 87 year old. It is not intended to diagnose infection nor to guide or monitor treatment.          Radiology Studies: Dg Chest 2 View  Result Date: 01/21/2016 CLINICAL DATA:  Preop evaluation prior to cholecystectomy. EXAM: CHEST  2 VIEW COMPARISON:  11/22/2007. FINDINGS: Normal sized heart. Clear lungs. Thoracic spine degenerative changes. IMPRESSION: No acute abnormality. Electronically Signed   By: Claudie Revering M.D.   On: 01/21/2016 10:01   US Abdomen Limited  Result Date: 01/20/2016 CLINICAL DATA:  Nausea vomiting.  Epigastric pain.  Abnormal CT. EXAM: US ABDOMEN LIMITED - RIGHT UPPER QUADRANT COMPARISON:  CT 01/20/2016. FINDINGS: Gallbladder: Gallbladder is distended and contains sludge and multiple gallstones. Gallbladder wall thickness is 5 mm. This suggest cholecystitis. Common bile duct: Diameter: 6 mm Liver: No focal lesion identified. Within normal limits in parenchymal echogenicity.  IMPRESSION: Distended gallbladder containing sludge and multiple gallstones. Gallbladder wall is thickened suggesting associated cholecystitis. Electronically Signed   By: Marcello Moores  Register   On: 01/20/2016 13:43        Scheduled Meds: . ciprofloxacin  400 mg Intravenous Q12H  . enalapril  10 mg Oral Daily  . enoxaparin (LOVENOX) injection  60 mg Subcutaneous Q24H  . insulin aspart  0-5 Units Subcutaneous QHS  . insulin aspart  0-9 Units Subcutaneous TID WC  . metronidazole  500 mg Intravenous Q8H  . pantoprazole  40 mg Oral Q1200  . cyanocobalamin  100 mcg Oral BID   Continuous Infusions: . lactated ringers 100 mL/hr at 01/22/16 1245     LOS: 2 days    Time spent: 25 minutes  Kathie Dike, MD [facility] Pager [phone]  If 7PM-7AM, please contact night-coverage [email or web address] Password [email or web address] 01/22/2016, 1:03 PM   By signing my name below, I, Collene Leyden, attest that this documentation has been prepared under the direction and in the presence of Kathie Dike, MD. Electronically signed: Collene Leyden, Scribe. 01/22/16 2:53PM   I, Dr. Kathie Dike, personally performed the services described in this documentaiton. All medical record entries made by the scribe were at my direction and in my presence. I have reviewed the chart and agree that the record reflects my personal performance and is accurate and complete  Kathie Dike, MD, 01/22/2016 3:07 PM

## 2016-01-23 DIAGNOSIS — E785 Hyperlipidemia, unspecified: Secondary | ICD-10-CM

## 2016-01-23 DIAGNOSIS — E119 Type 2 diabetes mellitus without complications: Secondary | ICD-10-CM

## 2016-01-23 DIAGNOSIS — I10 Essential (primary) hypertension: Secondary | ICD-10-CM | POA: Diagnosis present

## 2016-01-23 LAB — CBC
HCT: 29 % — ABNORMAL LOW (ref 39.0–52.0)
HEMOGLOBIN: 9.6 g/dL — AB (ref 13.0–17.0)
MCH: 28.4 pg (ref 26.0–34.0)
MCHC: 33.1 g/dL (ref 30.0–36.0)
MCV: 85.8 fL (ref 78.0–100.0)
Platelets: 264 10*3/uL (ref 150–400)
RBC: 3.38 MIL/uL — AB (ref 4.22–5.81)
RDW: 13.1 % (ref 11.5–15.5)
WBC: 15.1 10*3/uL — ABNORMAL HIGH (ref 4.0–10.5)

## 2016-01-23 LAB — BASIC METABOLIC PANEL
ANION GAP: 11 (ref 5–15)
BUN: 14 mg/dL (ref 6–20)
CALCIUM: 8.4 mg/dL — AB (ref 8.9–10.3)
CO2: 24 mmol/L (ref 22–32)
CREATININE: 1.23 mg/dL (ref 0.61–1.24)
Chloride: 102 mmol/L (ref 101–111)
GFR calc non Af Amer: >60 mL/min (ref >60)
GLUCOSE: 160 mg/dL — AB (ref 65–99)
Potassium: 4 mmol/L (ref 3.5–5.1)
Sodium: 137 mmol/L (ref 135–145)

## 2016-01-23 LAB — HEPATIC FUNCTION PANEL
ALBUMIN: 3.1 g/dL — AB (ref 3.5–5.0)
ALT: 111 U/L — AB (ref 17–63)
AST: 51 U/L — AB (ref 15–41)
Alkaline Phosphatase: 107 U/L (ref 38–126)
BILIRUBIN DIRECT: 0.1 mg/dL (ref 0.1–0.5)
BILIRUBIN TOTAL: 0.7 mg/dL (ref 0.3–1.2)
Indirect Bilirubin: 0.6 mg/dL (ref 0.3–0.9)
Total Protein: 6.9 g/dL (ref 6.5–8.1)

## 2016-01-23 LAB — GLUCOSE, CAPILLARY: GLUCOSE-CAPILLARY: 156 mg/dL — AB (ref 65–99)

## 2016-01-23 MED ORDER — OXYCODONE-ACETAMINOPHEN 7.5-325 MG PO TABS: 1.0000 | ORAL_TABLET | ORAL | 0 refills | Status: DC | PRN

## 2016-01-23 MED ORDER — CIPROFLOXACIN HCL 500 MG PO TABS: 500.0000 mg | ORAL_TABLET | Freq: Two times a day (BID) | ORAL | 0 refills | Status: DC

## 2016-01-23 NOTE — Addendum Note (Signed)
Addendum  created 01/23/16 0321 by Ollen Bowl, CRNA   Sign clinical note

## 2016-01-23 NOTE — Discharge Instructions (Signed)
Laparoscopic Cholecystectomy, Care After °Refer to this sheet in the next few weeks. These instructions provide you with information about caring for yourself after your procedure. Your health care provider may also give you more specific instructions. Your treatment has been planned according to current medical practices, but problems sometimes occur. Call your health care provider if you have any problems or questions after your procedure. °WHAT TO EXPECT AFTER THE PROCEDURE °After your procedure, it is common to have: °· Pain at your incision sites. You will be given pain medicines to control your pain. °· Mild nausea or vomiting. This should improve after the first 24 hours. °· Bloating and possible shoulder pain from the gas that was used during the procedure. This will improve after the first 24 hours. °HOME CARE INSTRUCTIONS °Incision Care °· Follow instructions from your health care provider about how to take care of your incisions. Make sure you: °¨ Wash your hands with soap and water before you change your bandage (dressing). If soap and water are not available, use hand sanitizer. °¨ Change your dressing as told by your health care provider. °¨ Leave stitches (sutures), skin glue, or adhesive strips in place. These skin closures may need to be in place for 2 weeks or longer. If adhesive strip edges start to loosen and curl up, you may trim the loose edges. Do not remove adhesive strips completely unless your health care provider tells you to do that. °· Do not take baths, swim, or use a hot tub until your health care provider approves. Ask your health care provider if you can take showers. You may only be allowed to take sponge baths for bathing. °General Instructions °· Take over-the-counter and prescription medicines only as told by your health care provider. °· Do not drive or operate heavy machinery while taking prescription pain medicine. °· Return to your normal diet as told by your health care  provider. °· Do not lift anything that is heavier than 10 lb (4.5 kg). °· Do not play contact sports for one week or until your health care provider approves. °SEEK MEDICAL CARE IF:  °· You have redness, swelling, or pain at the site of your incision. °· You have fluid, blood, or pus coming from your incision. °· You notice a bad smell coming from your incision area. °· Your surgical incisions break open. °· You have a fever. °SEEK IMMEDIATE MEDICAL CARE IF: °· You develop a rash. °· You have difficulty breathing. °· You have chest pain. °· You have increasing pain in your shoulders (shoulder strap areas). °· You faint or have dizzy episodes while you are standing. °· You have severe pain in your abdomen. °· You have nausea or vomiting that lasts for more than one day. °  °This information is not intended to replace advice given to you by your health care provider. Make sure you discuss any questions you have with your health care provider. °  °Document Released: 06/01/2005 Document Revised: 02/20/2015 Document Reviewed: 01/11/2013 °Elsevier Interactive Patient Education ©2016 [employer] ° °

## 2016-01-23 NOTE — Progress Notes (Signed)
1 Day Post-Op  Subjective: Patient feels much better. Minimal incisional pain. No nausea or vomiting.  Objective: Vital signs in last 24 hours: Temp:  [98 F (36.7 C)-99.1 F (37.3 C)] 99.1 F (37.3 C) (08/10 0449) Pulse Rate:  [79-98] 84 (08/10 0449) Resp:  [0-35] 20 (08/10 0449) BP: (104-171)/(62-88) 104/87 (08/10 0449) SpO2:  [95 %-100 %] 99 % (08/10 0449) Weight:  [119.7 kg (264 lb)] 119.7 kg (264 lb) (08/09 0905) Last BM Date: 01/21/16  Intake/Output from previous day: 08/09 0701 - 08/10 0700 In: 3355 [P.O.:480; I.V.:2475; IV Piggyback:400] Out: 6 [Urine:1; Blood:5] Intake/Output this shift: No intake/output data recorded.  General appearance: alert, cooperative and no distress Resp: clear to auscultation bilaterally Cardio: regular rate and rhythm, S1, S2 normal, no murmur, click, rub or gallop GI: Soft, dressings dry and intact.  Lab Results:   Recent Labs  01/22/16 0544 01/23/16 0530  WBC 14.4* 15.1*  HGB 10.0* 9.6*  HCT 29.6* 29.0*  PLT 234 264   BMET  Recent Labs  01/22/16 0544 01/23/16 0530  NA 136 137  K 3.3* 4.0  CL 103 102  CO2 25 24  GLUCOSE 202* 160*  BUN 10 14  CREATININE 1.09 1.23  CALCIUM 8.1* 8.4*   PT/INR No results for input(s): LABPROT, INR in the last 72 hours.  Studies/Results: Dg Chest 2 View  Result Date: 01/21/2016 CLINICAL DATA:  Preop evaluation prior to cholecystectomy. EXAM: CHEST  2 VIEW COMPARISON:  11/22/2007. FINDINGS: Normal sized heart. Clear lungs. Thoracic spine degenerative changes. IMPRESSION: No acute abnormality. Electronically Signed   By: Claudie Revering M.D.   On: 01/21/2016 10:01    Anti-infectives: Anti-infectives    Start     Dose/Rate Route Frequency Ordered Stop   01/23/16 0000  ciprofloxacin (CIPRO) 500 MG tablet     500 mg Oral 2 times daily 01/23/16 0746     01/21/16 0400  ciprofloxacin (CIPRO) IVPB 400 mg     400 mg 200 mL/hr over 60 Minutes Intravenous Every 12 hours 01/20/16 1738     01/20/16  1800  metroNIDAZOLE (FLAGYL) IVPB 500 mg     500 mg 100 mL/hr over 60 Minutes Intravenous Every 8 hours 01/20/16 1729     01/20/16 1730  ciprofloxacin (CIPRO) IVPB 400 mg  Status:  Discontinued     400 mg 200 mL/hr over 60 Minutes Intravenous Every 12 hours 01/20/16 1729 01/20/16 1738   01/20/16 1500  ciprofloxacin (CIPRO) IVPB 400 mg     400 mg 200 mL/hr over 60 Minutes Intravenous  Once 01/20/16 1453 01/20/16 1611      Assessment/Plan: s/p Procedure(s): LAPAROSCOPIC CHOLECYSTECTOMY Impression: Stable postoperatively. Okay for discharge from surgery standpoint. Would continue ciprofloxacin for 1 week. Will follow-up in my office in one week. Instructions given to patient. All questions answered.  LOS: 3 days    Shalayna Ornstein A 01/23/2016

## 2016-01-23 NOTE — Anesthesia Postprocedure Evaluation (Signed)
Anesthesia Post Note  Patient: Gregory Lindsey  Procedure(s) Performed: Procedure(s) (LRB): LAPAROSCOPIC CHOLECYSTECTOMY (N/A)  Patient location during evaluation: Nursing Unit Anesthesia Type: General Level of consciousness: awake and alert and oriented Pain management: pain level controlled Vital Signs Assessment: post-procedure vital signs reviewed and stable Respiratory status: spontaneous breathing Cardiovascular status: blood pressure returned to baseline Postop Assessment: adequate PO intake and no signs of nausea or vomiting Anesthetic complications: no    Last Vitals:  Vitals:   01/22/16 2000 01/23/16 0449  BP: 130/63 104/87  Pulse: 84 84  Resp: 20 20  Temp: 37.3 C 37.3 C    Last Pain:  Vitals:   01/23/16 0449  TempSrc: Oral  PainSc:                  Tressie Stalker

## 2016-01-23 NOTE — Discharge Summary (Signed)
Physician Discharge Summary  Gregory Lindsey CHE:[id number] DOB: [birth year] DOA: 01/20/2016  PCP: Robert Bellow, MD  Admit date: 01/20/2016 Discharge date: 01/23/2016  Admitted From: Home  Disposition:  Home   Recommendations for Outpatient Follow-up:  1. Follow up with PCP in 1-2 weeks 2. Follow up with gen surgery in 1 week  Home Health: No  Equipment/Devices: None Discharge Condition: Stable  CODE STATUS: Full  Diet recommendation: Heart Healthy   Brief/Interim Summary: 65 year old male with a hx of DM and HLD presented with complaints of abdominal pain with nausea vomiting. CT scan of the abdomen revealed gall bladder wall thickening. [place] abd revealed gall bladder distention with sludge and gallstones. The patient was started on IV antibiotics. Surgery was consulted and performed cholecystectomy. Post op course was unremarkable and he was discharged home the following day in stable condition.   Discharge Diagnoses:  Active Problems:   Cholecystitis   Acute cholecystitis   HTN (hypertension)   Diabetes ([place])  Pt presented with complaints of abdominal pain, nausea, and vomiting found to be due to acute cholecystitis and cholelithiasis. On admission general surgery was consulted and a CCY was performed with no complications noted. On discharge she was noted to have overall improved. He was continued on a course of ciprofloxacin and will follow up with gen surgery in 1 week  1. HTN. Continue ACE inhibitors 2. DM. resume metformin on discharge  3. Hyperlipidemia. Resume statin  Discharge Instructions     Medication List    TAKE these medications   aspirin 81 MG tablet Take 81 mg by mouth daily.   ciprofloxacin 500 MG tablet Commonly known as:  CIPRO Take 1 tablet (500 mg total) by mouth 2 (two) times daily.   cyanocobalamin 1000 MCG tablet Take 100 mcg by mouth 2 (two) times daily.   enalapril 10 MG tablet Commonly known as:  VASOTEC   ibuprofen 800 MG  tablet Commonly known as:  ADVIL,MOTRIN Take 800 mg by mouth every 8 (eight) hours as needed.   IRON SUPPLEMENT 325 (65 FE) MG tablet Generic drug:  ferrous sulfate Take 325 mg by mouth 2 (two) times daily.   metFORMIN 500 MG 24 hr tablet Commonly known as:  GLUCOPHAGE-XR Take 1 tablet by mouth 2 (two) times daily.   oxyCODONE-acetaminophen 7.5-325 MG tablet Commonly known as:  PERCOCET Take 1-2 tablets by mouth every 4 (four) hours as needed.   pravastatin 40 MG tablet Commonly known as:  PRAVACHOL   sildenafil 20 MG tablet Commonly known as:  REVATIO Take 20 mg by mouth daily as needed.      Follow-up Information    Jamesetta So, MD. Schedule an appointment as soon as possible for a visit on 01/30/2016.   Specialty:  General Surgery Contact information: [id number] [place] [address] [phone]          No Known Allergies  Consultations:  General surgery    Procedures/Studies: Dg Chest 2 View  Result Date: 01/21/2016 CLINICAL DATA:  Preop evaluation prior to cholecystectomy. EXAM: CHEST  2 VIEW COMPARISON:  11/22/2007. FINDINGS: Normal sized heart. Clear lungs. Thoracic spine degenerative changes. IMPRESSION: No acute abnormality. Electronically Signed   By: Claudie Revering M.D.   On: 01/21/2016 10:01   Ct Abdomen Pelvis W Contrast  Result Date: 01/20/2016 CLINICAL DATA:  Nausea and vomiting for 2 days, black stools, diabetes mellitus EXAM: CT ABDOMEN AND PELVIS WITH CONTRAST TECHNIQUE: Multidetector CT imaging of the abdomen and pelvis was  performed using the standard protocol following bolus administration of intravenous contrast. CONTRAST:  170m ISOVUE-300 IOPAMIDOL (ISOVUE-300) INJECTION 61% IV. Dilute oral contrast. COMPARISON:  None FINDINGS: Lower chest:  Lung bases clear Hepatobiliary: Liver normal appearance. Gallbladder wall thickening versus pericholecystic fluid. No definite calcified gallstones identified. No biliary dilatation. Pancreas:  Normal appearance Spleen: Normal appearance Adrenals/Urinary Tract: Adrenal glands normal appearance. Kidneys, ureters, and urinary bladder normal appearance. Mild prostate gland enlargement 5.7 x 4.2 cm image 91. Stomach/Bowel: Colon predominantly unopacified and under distended. Stomach and bowel loops grossly normal appearance. Normal appendix. Vascular/Lymphatic: Scattered atherosclerotic calcifications aorta. Aorta normal caliber. No adenopathy. Reproductive: N/A Other: Tiny amount of nonspecific free pelvic fluid. No free air. Tiny umbilical hernia containing fat. Musculoskeletal: No acute osseous findings. Mild scattered degenerative disc and facet disease changes thoracolumbar spine. IMPRESSION: Gallbladder wall thickening versus pericholecystic fluid without definite visualization of gallstones ; followup ultrasound assessment recommended. Aortic atherosclerosis. Otherwise negative exam. Electronically Signed   By: MLavonia DanaM.D.   On: 01/20/2016 12:42   [place]dom[place] Limited  Result Date: 01/20/2016 CLINICAL DATA:  Nausea vomiting.  Epigastric pain.  Abnormal CT. EXAM: [place] LIMITED - RIGHT UPPER QUADRANT COMPARISON:  CT 01/20/2016. FINDINGS: Gallbladder: Gallbladder is distended and contains sludge and multiple gallstones. Gallbladder wall thickness is 5 mm. This suggest cholecystitis. Common bile duct: Diameter: 6 mm Liver: No focal lesion identified. Within normal limits in parenchymal echogenicity. IMPRESSION: Distended gallbladder containing sludge and multiple gallstones. Gallbladder wall is thickened suggesting associated cholecystitis. Electronically Signed   By: TWaterview  On: 01/20/2016 13:43   Cholecystectomy 8/9  Subjective: No complaints. No nausea or vomiting, tolerating diet  Discharge Exam: Vitals:   01/22/16 2000 01/23/16 0449  BP: 130/63 104/87  Pulse: 84 84  Resp: 20 20  Temp: 99.1 F (37.3 C) 99.1 F (37.3 C)   Vitals:   01/22/16 1305 01/22/16 1700  01/22/16 2000 01/23/16 0449  BP: (!) 142/75 (!) 144/67 130/63 104/87  Pulse: 95 79 84 84  Resp: '18 20 20 20  '$ Temp: 98.1 F (36.7 C) 98.6 F (37 C) 99.1 F (37.3 C) 99.1 F (37.3 C)  TempSrc: Oral Oral Oral Oral  SpO2: 99% 100% 97% 99%  Weight:      Height:        General: Pt is alert, awake, not in acute distress Cardiovascular: RRR, S1/S2 +, no rubs, no gallops Respiratory: CTA bilaterally, no wheezing, no rhonchi Abdominal: Soft, mild tenderness over incision sites, ND, bowel sounds + Extremities: no edema, no cyanosis    The results of significant diagnostics from this hospitalization (including imaging, microbiology, ancillary and laboratory) are listed below for reference.     Microbiology: Recent Results (from the past 240 hour(s))  Surgical pcr screen     Status: None   Collection Time: 01/21/16  6:55 PM  Result Value Ref Range Status   MRSA, PCR NEGATIVE NEGATIVE Final   Staphylococcus aureus NEGATIVE NEGATIVE Final    Comment:        The Xpert SA Assay (FDA approved for NASAL specimens in patients over 2107years of age), is one component of a comprehensive surveillance program.  Test performance has been validated [facility] greater than or equal to 139year old. It is not intended to diagnose infection nor to guide or monitor treatment.      Labs: BNP (last 3 results) No results for input(s): BNP in the last 8760 hours. Basic Metabolic Panel:  Recent Labs Lab 01/20/16 1004 01/21/16 0550 01/22/16 0544 01/23/16 0530  NA 135 135 136 137  K 3.4* 3.7 3.3* 4.0  CL 101 100* 103 102  CO2 '25 26 25 24  '$ GLUCOSE 233* 211* 202* 160*  BUN '17 13 10 14  '$ CREATININE 1.18 1.18 1.09 1.23  CALCIUM 8.4* 8.2* 8.1* 8.4*  MG 1.7  --   --   --   PHOS 2.9  --   --   --    Liver Function Tests:  Recent Labs Lab 01/20/16 1004 01/21/16 0550 01/22/16 0544 01/23/16 0530  AST 20 355* 63* 51*  ALT 19 253* 141* 111*  ALKPHOS 64 158* 120 107   BILITOT 0.6 1.0 0.7 0.7  PROT 8.3* 7.3 6.8 6.9  ALBUMIN 4.3 3.6 3.2* 3.1*    Recent Labs Lab 01/20/16 1004  LIPASE 59*   No results for input(s): AMMONIA in the last 168 hours. CBC:  Recent Labs Lab 01/20/16 1004 01/21/16 0550 01/22/16 0544 01/23/16 0530  WBC 16.1* 16.6* 14.4* 15.1*  NEUTROABS 13.8*  --   --   --   HGB 11.2* 10.5* 10.0* 9.6*  HCT 34.7* 32.0* 29.6* 29.0*  MCV 87.2 86.5 85.1 85.8  PLT 255 242 234 264   Cardiac Enzymes: No results for input(s): CKTOTAL, CKMB, CKMBINDEX, TROPONINI in the last 168 hours. BNP: Invalid input(s): POCBNP CBG:  Recent Labs Lab 01/22/16 0735 01/22/16 0922 01/22/16 1149 01/22/16 1615 01/22/16 2048  GLUCAP 223* 213* 231* 319* 235*   D-Dimer No results for input(s): DDIMER in the last 72 hours. Hgb A1c No results for input(s): HGBA1C in the last 72 hours. Lipid Profile No results for input(s): CHOL, HDL, LDLCALC, TRIG, CHOLHDL, LDLDIRECT in the last 72 hours. Thyroid function studies No results for input(s): TSH, T4TOTAL, T3FREE, THYROIDAB in the last 72 hours.  Invalid input(s): FREET3 Anemia work up No results for input(s): VITAMINB12, FOLATE, FERRITIN, TIBC, IRON, RETICCTPCT in the last 72 hours. Urinalysis    Component Value Date/Time   COLORURINE YELLOW 01/20/2016 1344   APPEARANCEUR CLEAR 01/20/2016 1344   LABSPEC 1.020 01/20/2016 1344   PHURINE 5.0 01/20/2016 1344   GLUCOSEU 100 (A) 01/20/2016 1344   HGBUR NEGATIVE 01/20/2016 1344   BILIRUBINUR NEGATIVE 01/20/2016 1344   KETONESUR NEGATIVE 01/20/2016 1344   PROTEINUR TRACE (A) 01/20/2016 1344   NITRITE NEGATIVE 01/20/2016 1344   LEUKOCYTESUR NEGATIVE 01/20/2016 1344   Sepsis Labs Invalid input(s): PROCALCITONIN,  WBC,  LACTICIDVEN Microbiology Recent Results (from the past 240 hour(s))  Surgical pcr screen     Status: None   Collection Time: 01/21/16  6:55 PM  Result Value Ref Range Status   MRSA, PCR NEGATIVE NEGATIVE Final   Staphylococcus  aureus NEGATIVE NEGATIVE Final    Comment:        The Xpert SA Assay (FDA approved for NASAL specimens in patients over 38 years of age), is one component of a comprehensive surveillance program.  Test performance has been validated by [facility] for patients greater than or equal to 110 year old. It is not intended to diagnose infection nor to guide or monitor treatment.      Time coordinating discharge: Over 30 minutes  SIGNED:  Kathie Dike, MD [facility] 01/23/2016, 8:17 AM If 7PM-7AM, please contact night-coverage [email or web address] Password [email or web address]

## 2016-01-23 NOTE — Progress Notes (Signed)
Patient with orders to be discharge home. Discharge instructions given to patient, verbalized understanding. Prescriptions given. Patient stable. Patient left in private vehicle with spouse.

## 2016-01-28 ENCOUNTER — Encounter (HOSPITAL_COMMUNITY): Payer: Self-pay | Admitting: General Surgery

## 2018-11-30 ENCOUNTER — Emergency Department (HOSPITAL_COMMUNITY): Payer: Medicare Other

## 2018-11-30 ENCOUNTER — Other Ambulatory Visit: Payer: Self-pay

## 2018-11-30 ENCOUNTER — Encounter (HOSPITAL_COMMUNITY): Payer: Self-pay | Admitting: Emergency Medicine

## 2018-11-30 ENCOUNTER — Emergency Department (HOSPITAL_COMMUNITY)
Admission: EM | Admit: 2018-11-30 | Discharge: 2018-12-01 | Disposition: A | Discharge status: Home / Self Care | Payer: Medicare Other | Attending: Emergency Medicine | Admitting: Emergency Medicine

## 2018-11-30 DIAGNOSIS — Z7984 Long term (current) use of oral hypoglycemic drugs: Secondary | ICD-10-CM | POA: Diagnosis not present

## 2018-11-30 DIAGNOSIS — R1010 Upper abdominal pain, unspecified: Secondary | ICD-10-CM | POA: Diagnosis not present

## 2018-11-30 DIAGNOSIS — I10 Essential (primary) hypertension: Secondary | ICD-10-CM | POA: Diagnosis not present

## 2018-11-30 DIAGNOSIS — Z7982 Long term (current) use of aspirin: Secondary | ICD-10-CM | POA: Insufficient documentation

## 2018-11-30 DIAGNOSIS — E119 Type 2 diabetes mellitus without complications: Secondary | ICD-10-CM | POA: Insufficient documentation

## 2018-11-30 DIAGNOSIS — R109 Unspecified abdominal pain: Secondary | ICD-10-CM | POA: Diagnosis present

## 2018-11-30 DIAGNOSIS — R918 Other nonspecific abnormal finding of lung field: Secondary | ICD-10-CM | POA: Insufficient documentation

## 2018-11-30 DIAGNOSIS — J984 Other disorders of lung: Secondary | ICD-10-CM | POA: Diagnosis not present

## 2018-11-30 DIAGNOSIS — R1032 Left lower quadrant pain: Secondary | ICD-10-CM | POA: Diagnosis not present

## 2018-11-30 DIAGNOSIS — Z79899 Other long term (current) drug therapy: Secondary | ICD-10-CM | POA: Diagnosis not present

## 2018-11-30 DIAGNOSIS — R1084 Generalized abdominal pain: Secondary | ICD-10-CM | POA: Diagnosis not present

## 2018-11-30 LAB — CBC WITH DIFFERENTIAL/PLATELET
Abs Immature Granulocytes: 0.03 10*3/uL (ref 0.00–0.07)
Basophils Absolute: 0 10*3/uL (ref 0.0–0.1)
Basophils Relative: 0 %
Eosinophils Absolute: 0 10*3/uL (ref 0.0–0.5)
Eosinophils Relative: 0 %
HCT: 45.9 % (ref 39.0–52.0)
Hemoglobin: 14.2 g/dL (ref 13.0–17.0)
Immature Granulocytes: 0 %
Lymphocytes Relative: 15 %
Lymphs Abs: 1.4 10*3/uL (ref 0.7–4.0)
MCH: 27 pg (ref 26.0–34.0)
MCHC: 30.9 g/dL (ref 30.0–36.0)
MCV: 87.3 fL (ref 80.0–100.0)
Monocytes Absolute: 0.3 10*3/uL (ref 0.1–1.0)
Monocytes Relative: 3 %
Neutro Abs: 8 10*3/uL — ABNORMAL HIGH (ref 1.7–7.7)
Neutrophils Relative %: 82 %
Platelets: 221 10*3/uL (ref 150–400)
RBC: 5.26 MIL/uL (ref 4.22–5.81)
RDW: 13.5 % (ref 11.5–15.5)
WBC: 9.8 10*3/uL (ref 4.0–10.5)
nRBC: 0 % (ref 0.0–0.2)

## 2018-11-30 LAB — URINALYSIS, ROUTINE W REFLEX MICROSCOPIC
Bacteria, UA: NONE SEEN
Bilirubin Urine: NEGATIVE
Glucose, UA: >=500 mg/dL — AB
Ketones, ur: 20 mg/dL — AB
Leukocytes,Ua: NEGATIVE
Nitrite: NEGATIVE
Protein, ur: 100 mg/dL — AB
Specific Gravity, Urine: 1.027 (ref 1.005–1.030)
pH: 5 (ref 5.0–8.0)

## 2018-11-30 LAB — BASIC METABOLIC PANEL
Anion gap: 15 (ref 5–15)
BUN: 22 mg/dL (ref 8–23)
CO2: 25 mmol/L (ref 22–32)
Calcium: 9.4 mg/dL (ref 8.9–10.3)
Chloride: 95 mmol/L — ABNORMAL LOW (ref 98–111)
Creatinine, Ser: 1.51 mg/dL — ABNORMAL HIGH (ref 0.61–1.24)
GFR calc Af Amer: 55 mL/min — ABNORMAL LOW (ref >60)
GFR calc non Af Amer: 48 mL/min — ABNORMAL LOW (ref >60)
Glucose, Bld: 406 mg/dL — ABNORMAL HIGH (ref 70–99)
Potassium: 3.9 mmol/L (ref 3.5–5.1)
Sodium: 135 mmol/L (ref 135–145)

## 2018-11-30 LAB — CBG MONITORING, ED: Glucose-Capillary: 393 mg/dL — ABNORMAL HIGH (ref 70–99)

## 2018-11-30 MED ORDER — HYDROMORPHONE HCL 1 MG/ML IJ SOLN
0.5000 mg | Freq: Once | INTRAMUSCULAR | Status: AC
  Administered 2018-11-30: 0.5 mg via INTRAVENOUS
  Filled 2018-11-30: qty 1

## 2018-11-30 MED ORDER — ONDANSETRON HCL 4 MG/2ML IJ SOLN
4.0000 mg | Freq: Once | INTRAMUSCULAR | Status: AC
  Administered 2018-11-30: 4 mg via INTRAVENOUS
  Filled 2018-11-30: qty 2

## 2018-11-30 NOTE — ED Notes (Signed)
Reminded patient that a urine specimen was needed, patient states that no one told him he needed to. Gave patient urinal at this time to attempt to get urine sample

## 2018-11-30 NOTE — ED Triage Notes (Signed)
L flank pain x 6 months ago,  C/o acute upper abd pain after eating chinese last night. C/o n/v x4 since yesterday.  Urinary urgency x 1 month.

## 2018-12-01 ENCOUNTER — Emergency Department (HOSPITAL_COMMUNITY): Payer: Medicare Other

## 2018-12-01 DIAGNOSIS — R1010 Upper abdominal pain, unspecified: Secondary | ICD-10-CM | POA: Diagnosis not present

## 2018-12-01 DIAGNOSIS — R1032 Left lower quadrant pain: Secondary | ICD-10-CM | POA: Diagnosis not present

## 2018-12-01 DIAGNOSIS — R109 Unspecified abdominal pain: Secondary | ICD-10-CM | POA: Diagnosis not present

## 2018-12-01 DIAGNOSIS — J984 Other disorders of lung: Secondary | ICD-10-CM | POA: Diagnosis not present

## 2018-12-01 LAB — BASIC METABOLIC PANEL
Anion gap: 11 (ref 5–15)
BUN: 21 mg/dL (ref 8–23)
CO2: 27 mmol/L (ref 22–32)
Calcium: 9.3 mg/dL (ref 8.9–10.3)
Chloride: 98 mmol/L (ref 98–111)
Creatinine, Ser: 1.4 mg/dL — ABNORMAL HIGH (ref 0.61–1.24)
GFR calc Af Amer: >60 mL/min (ref >60)
GFR calc non Af Amer: 52 mL/min — ABNORMAL LOW (ref >60)
Glucose, Bld: 344 mg/dL — ABNORMAL HIGH (ref 70–99)
Potassium: 4.8 mmol/L (ref 3.5–5.1)
Sodium: 136 mmol/L (ref 135–145)

## 2018-12-01 LAB — TROPONIN I: Troponin I: <0.03 ng/mL (ref <0.03)

## 2018-12-01 LAB — CBG MONITORING, ED: Glucose-Capillary: 297 mg/dL — ABNORMAL HIGH (ref 70–99)

## 2018-12-01 MED ORDER — IOHEXOL 300 MG/ML  SOLN: 75.0000 mL | Freq: Once | INTRAMUSCULAR | Status: DC | PRN

## 2018-12-01 MED ORDER — IOHEXOL 350 MG/ML SOLN
100.0000 mL | Freq: Once | INTRAVENOUS | Status: AC | PRN
  Administered 2018-12-01: 100 mL via INTRAVENOUS

## 2018-12-01 NOTE — Discharge Instructions (Signed)
As we discussed your testing is concerning for a nodule on your lung that is suspicious for cancer.  You should follow-up with the cancer doctor as well as your primary doctor.  Return to the ED with difficulty breathing, chest pain, shortness of breath or other concerns.

## 2018-12-01 NOTE — ED Provider Notes (Addendum)
CT scan shows no evidence of aortic dissection or pulmonary embolism. D/w Dr. Jobe Igo.  Does show concerning finding of new lung mass suspicious for carcinoma. This is however on the opposite side of his flank pain.  Labs show hyperglycemia with normal anion gap. Troponin negative.  Results discussed with patient with including concerning finding for lung carcinoma.  He is given referrals to oncology as well as pulmonology.  Return precautions discussed.   EKG Interpretation  Date/Time:  Thursday December 01 2018 02:38:24 EDT Ventricular Rate:  94 PR Interval:    QRS Duration: 91 QT Interval:  360 QTC Calculation: 451 R Axis:   44 Text Interpretation:  Sinus rhythm Atrial premature complex Probable left atrial enlargement Baseline wander in lead(s) I II aVR V2 No significant change was found Confirmed by Ezequiel Essex [phone]) on 12/01/2018 2:52:44 AM        Ezequiel Essex, MD 12/01/18 9217    Ezequiel Essex, MD 12/01/18 [phone]

## 2018-12-01 NOTE — ED Provider Notes (Addendum)
[facility] EMERGENCY DEPARTMENT Provider Note   CSN: [id number] Arrival date & time: 11/30/18  1911     History   Chief Complaint Chief Complaint  Patient presents with   Abdominal Pain   Flank Pain    HPI Gregory Lindsey is a 68 y.o. male.     The history is provided by the patient. No language interpreter was used.  Abdominal Pain Pain location:  Generalized Pain quality: aching   Pain radiates to:  Does not radiate Pain severity:  Moderate Onset quality:  Gradual Duration:  6 weeks Timing:  Intermittent Progression:  Worsening Chronicity:  New Relieved by:  Nothing Worsened by:  Nothing Associated symptoms: no chest pain   Risk factors: no alcohol abuse   Flank Pain Associated symptoms include abdominal pain. Pertinent negatives include no chest pain.   Pt reports he has been having left flank pain for the last 6 weeks.  Pt reports pain is worse tonight.  Pt thinks he may have a kidney stone  Past Medical History:  Diagnosis Date   Diabetes mellitus    Hypercholesterolemia    Hypertension    Pneumonia     Patient Active Problem List   Diagnosis Date Noted   HTN (hypertension) 01/23/2016   Diabetes ([place]) 01/23/2016   Cholecystitis 01/20/2016   Acute cholecystitis 01/20/2016   History of colonic polyps 03/27/2015    Past Surgical History:  Procedure Laterality Date   CHOLECYSTECTOMY N/A 01/22/2016   Procedure: LAPAROSCOPIC CHOLECYSTECTOMY;  Surgeon: Aviva Signs, MD;  Location: [facility] ORS;  Service: General;  Laterality: N/A;   COLONOSCOPY  2007 SLF   1 CM SIMPLE ADENOMA   COLONOSCOPY N/A 04/26/2015   Procedure: COLONOSCOPY;  Surgeon: Danie Binder, MD;  Location: [facility] ENDO SUITE;  Service: Endoscopy;  Laterality: N/A;  930         Home Medications    Prior to Admission medications   Medication Sig Start Date End Date Taking? Authorizing Provider  aspirin 81 MG tablet Take 81 mg by mouth daily.   Yes [provider]    cyanocobalamin 1000 MCG tablet Take 100 mcg by mouth 2 (two) times daily.     Yes [provider]  enalapril (VASOTEC) 10 MG tablet Take 10 mg by mouth daily.  03/20/15  Yes [provider]  ferrous sulfate (IRON SUPPLEMENT) 325 (65 FE) MG tablet Take 325 mg by mouth 2 (two) times daily.     Yes [provider]  metFORMIN (GLUCOPHAGE-XR) 500 MG 24 hr tablet Take 1 tablet by mouth 2 (two) times daily. 12/16/15  Yes [provider]  pravastatin (PRAVACHOL) 40 MG tablet Take 40 mg by mouth daily.  03/20/15  Yes [provider]    Family History Family History  Problem Relation Age of Onset   Colon cancer Neg Hx     Social History Social History   Tobacco Use   Smoking status: Never Smoker   Smokeless tobacco: Never Used  Substance Use Topics   Alcohol use: Yes    Alcohol/week: 0.0 standard drinks    Comment: beer 2-3 times per week, 2 shots of scotch on saturday night    Drug use: No     Allergies   Patient has no known allergies.   Review of Systems Review of Systems  Cardiovascular: Negative for chest pain.  Gastrointestinal: Positive for abdominal pain.  Genitourinary: Positive for flank pain.  All other systems reviewed and are negative.    Physical  Exam Updated Vital Signs BP (!) 181/91 (BP Location: Left Arm)    Pulse 96    Temp 97.6 F (36.4 C) (Oral)    Resp 20    SpO2 98%   Physical Exam Vitals signs and nursing note reviewed.  Constitutional:      Appearance: He is well-developed.  HENT:     Head: Normocephalic and atraumatic.  Eyes:     Conjunctiva/sclera: Conjunctivae normal.  Neck:     Musculoskeletal: Neck supple.  Cardiovascular:     Rate and Rhythm: Normal rate and regular rhythm.     Heart sounds: No murmur.  Pulmonary:     Effort: Pulmonary effort is normal. No respiratory distress.     Breath sounds: Normal breath sounds.  Abdominal:     Palpations: Abdomen is soft.     Tenderness: There is  no abdominal tenderness.  Skin:    General: Skin is warm and dry.  Neurological:     General: No focal deficit present.     Mental Status: He is alert.  Psychiatric:        Mood and Affect: Mood normal.      ED Treatments / Results  Labs (all labs ordered are listed, but only abnormal results are displayed) Labs Reviewed  CBC WITH DIFFERENTIAL/PLATELET - Abnormal; Notable for the following components:      Result Value   Neutro Abs 8.0 (*)    All other components within normal limits  BASIC METABOLIC PANEL - Abnormal; Notable for the following components:   Chloride 95 (*)    Glucose, Bld 406 (*)    Creatinine, Ser 1.51 (*)    GFR calc non Af Amer 48 (*)    GFR calc Af Amer 55 (*)    All other components within normal limits  URINALYSIS, ROUTINE W REFLEX MICROSCOPIC - Abnormal; Notable for the following components:   Color, Urine STRAW (*)    Glucose, UA >=500 (*)    Hgb urine dipstick SMALL (*)    Ketones, ur 20 (*)    Protein, ur 100 (*)    All other components within normal limits  CBG MONITORING, ED - Abnormal; Notable for the following components:   Glucose-Capillary 393 (*)    All other components within normal limits    EKG    Radiology Ct Renal Stone Study  Result Date: 12/01/2018 CLINICAL DATA:  Chronic left flank pain. Upper abdominal pain since last night. EXAM: CT ABDOMEN AND PELVIS WITHOUT CONTRAST TECHNIQUE: Multidetector CT imaging of the abdomen and pelvis was performed following the standard protocol without IV contrast. COMPARISON:  01/20/2016 FINDINGS: Lower chest: Nodular density seen in the right lower lobe on image 15 measuring 1.7 cm. This measures up to 2.4 cm on sagittal image 56. Left lung base clear. No effusions. Heart is normal size. Hepatobiliary: No focal liver abnormality is seen. Status post cholecystectomy. No biliary dilatation. Pancreas: No focal abnormality or ductal dilatation. Spleen: No focal abnormality.  Normal size.  Adrenals/Urinary Tract: No adrenal abnormality. No focal renal abnormality. No stones or hydronephrosis. Urinary bladder is unremarkable. Stomach/Bowel: Stomach, large and small bowel grossly unremarkable. Normal appendix Vascular/Lymphatic: Aortic atherosclerosis. No enlarged abdominal or pelvic lymph nodes. Reproductive: Mildly prominent prostate. Other: No free fluid or free air. Musculoskeletal: No acute bony abnormality. IMPRESSION: Irregular nodular density in the right lower lobe measuring up to 2.4 cm on sagittal imaging, 1.7 cm on axial imaging. Cannot exclude primary lung cancer. Consider further evaluation with dedicated full  chest CT with IV contrast or PET CT. Prior cholecystectomy. Aortic atherosclerosis. Mildly prominent prostate. No acute findings in the abdomen or pelvis. Electronically Signed   By: Rolm Baptise M.D.   On: 12/01/2018 00:16    Procedures Procedures (including critical care time)  Medications Ordered in ED Medications  ondansetron (ZOFRAN) injection 4 mg (4 mg Intravenous Given 11/30/18 2052)  HYDROmorphone (DILAUDID) injection 0.5 mg (0.5 mg Intravenous Given 11/30/18 2052)     Initial Impression / Assessment and Plan / ED Course  I have reviewed the triage vital signs and the nursing notes.  Pertinent labs & imaging results that were available during my care of the patient were reviewed by me and considered in my medical decision making (see chart for details).        MDM  No stone on Ct.  Radiologist advised ct chest to evaluate nodule.  Pt's care turned over to Dr. Wyvonnia Dusky for evaluation   Final Clinical Impressions(s) / ED Diagnoses   Final diagnoses:  Left flank pain    ED Discharge Orders    None       Sidney Ace 12/01/18 0027    Fransico Meadow, PA-C 12/01/18 0126    Milton Ferguson, MD 12/01/18 1630

## 2018-12-07 ENCOUNTER — Other Ambulatory Visit: Payer: Self-pay

## 2018-12-07 ENCOUNTER — Inpatient Hospital Stay (HOSPITAL_COMMUNITY): Payer: Medicare Other | Attending: Hematology | Admitting: Hematology

## 2018-12-07 ENCOUNTER — Encounter (HOSPITAL_COMMUNITY): Payer: Self-pay | Admitting: Hematology

## 2018-12-07 DIAGNOSIS — R1010 Upper abdominal pain, unspecified: Secondary | ICD-10-CM | POA: Insufficient documentation

## 2018-12-07 DIAGNOSIS — E7800 Pure hypercholesterolemia, unspecified: Secondary | ICD-10-CM | POA: Diagnosis not present

## 2018-12-07 DIAGNOSIS — Z7982 Long term (current) use of aspirin: Secondary | ICD-10-CM | POA: Insufficient documentation

## 2018-12-07 DIAGNOSIS — I2510 Atherosclerotic heart disease of native coronary artery without angina pectoris: Secondary | ICD-10-CM | POA: Insufficient documentation

## 2018-12-07 DIAGNOSIS — I10 Essential (primary) hypertension: Secondary | ICD-10-CM | POA: Diagnosis not present

## 2018-12-07 DIAGNOSIS — Z7984 Long term (current) use of oral hypoglycemic drugs: Secondary | ICD-10-CM | POA: Diagnosis not present

## 2018-12-07 DIAGNOSIS — Z79899 Other long term (current) drug therapy: Secondary | ICD-10-CM | POA: Insufficient documentation

## 2018-12-07 DIAGNOSIS — R918 Other nonspecific abnormal finding of lung field: Secondary | ICD-10-CM | POA: Diagnosis not present

## 2018-12-07 DIAGNOSIS — Z801 Family history of malignant neoplasm of trachea, bronchus and lung: Secondary | ICD-10-CM | POA: Diagnosis not present

## 2018-12-07 DIAGNOSIS — I700 Atherosclerosis of aorta: Secondary | ICD-10-CM

## 2018-12-07 DIAGNOSIS — Z87891 Personal history of nicotine dependence: Secondary | ICD-10-CM | POA: Insufficient documentation

## 2018-12-07 DIAGNOSIS — E1140 Type 2 diabetes mellitus with diabetic neuropathy, unspecified: Secondary | ICD-10-CM | POA: Diagnosis not present

## 2018-12-07 NOTE — Assessment & Plan Note (Signed)
1.  Right lower lobe lung mass: - Presentation to the ER on 11/30/2018 with left flank pain. - CT abdomen renal study showed possible lung mass. -CT angiogram of the chest, abdomen and pelvis for dissection with and without contrast showed 2.4 cm irregular nodule in the right lower lobe concerning for possible primary lung cancer.  No clear evidence of metastatic disease.  No acute findings in the abdomen or pelvis. - He does not have any left flank pain anymore.  This is likely caused by constipation. - Patient smoked one 1/4 pack of cigarettes for almost 18 years followed by cigars and pipes for 10 years.  He quit smoking 18 to 19 years ago. 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  Denies any asbestos exposure. - Denies any cough, hemoptysis, significant weight loss.  No chest pains reported. - I had prolonged discussion with the patient about the findings on the CT scan. -I have recommended a whole-body PET CT scan and a brain MRI with and without contrast for staging purposes. -We will also obtain PFTs with bronchodilators. -We will see him back after the scans to discuss further plan of action.  2.  Diabetic neuropathy: -He has numbness in the feet from longstanding diabetes.

## 2018-12-07 NOTE — Progress Notes (Signed)
[facility] NOTE  Patient Care Team: Lemmie Evens, MD as PCP - General (Family Medicine) Danie Binder, MD as Consulting Physician (Gastroenterology)  CHIEF COMPLAINTS/PURPOSE OF CONSULTATION:  Right lower lobe lung mass.  HISTORY OF PRESENTING ILLNESS:  Gregory Lindsey 68 y.o. male is seen in consultation today for further work-up and management of right lower lobe lung mass.  He went to the ER on 11/30/2018 with left-sided flank pain.  CT renal stone study on 12/01/2018 showed irregular nodular density in the right lower lobe measuring up to 2.4 cm in sagittal imaging and 1.7 cm in axial imaging.  Mildly prominent prostate with no acute findings in the abdomen and pelvis.  CT Angio of the chest, abdomen and pelvis for dissection with and without contrast showed a 2.4 cm irregular nodule in the right lower lobe concerning for primary lung cancer.  No evidence of adenopathy.  No acute findings in abdomen or pelvis.  He denies any cough or hemoptysis.  No recent significant weight loss.  No fevers or night sweats.  Left flank pain has improved at this time.  He walks with a cane secondary to knee arthritis.  He smoked cigarettes for 18 years, 1-1/4 pack a day.  He also smoked 2 pipes and occasional cigars for 10 years.  He quit smoking completely about 18 to 19 years ago.  He is a retired Museum/gallery curator, does part-time Engineer, materials.  He lives at home with his wife and son.  He worked in [employer] for 4 and half years but denied any asbestos exposure.  Family history significant for paternal grandfather who died of lung cancer and was a smoker.  Denies any headaches or vision changes.  No chest pains reported.  Appetite is 25% and energy levels are 75%.  MEDICAL HISTORY:  Past Medical History:  Diagnosis Date  . Diabetes mellitus   . Hypercholesterolemia   . Hypertension   . Pneumonia     SURGICAL HISTORY: Past Surgical History:  Procedure Laterality Date  .  CHOLECYSTECTOMY N/A 01/22/2016   Procedure: LAPAROSCOPIC CHOLECYSTECTOMY;  Surgeon: Aviva Signs, MD;  Location: [facility];  Service: General;  Laterality: N/A;  . COLONOSCOPY  2007 [facility]   1 CM SIMPLE ADENOMA  . COLONOSCOPY N/A 04/26/2015   Procedure: COLONOSCOPY;  Surgeon: Danie Binder, MD;  Location: [facility];  Service: Endoscopy;  Laterality: N/A;  930     SOCIAL HISTORY: Social History   Socioeconomic History  . Marital status: Married    Spouse name: Not on file  . Number of children: Not on file  . Years of education: Not on file  . Highest education level: Not on file  Occupational History  . Occupation: employed  Scientific laboratory technician  . Financial resource strain: Not very hard  . Food insecurity    Worry: Never true    Inability: Never true  . Transportation needs    Medical: No    Non-medical: No  Tobacco Use  . Smoking status: Never Smoker  . Smokeless tobacco: Never Used  Substance and Sexual Activity  . Alcohol use: Yes    Alcohol/week: 0.0 standard drinks    Comment: beer 2-3 times per week, 2 shots of scotch on saturday night   . Drug use: No  . Sexual activity: Not Currently  Lifestyle  . Physical activity    Days per week: 0 days    Minutes per session: 0 min  . Stress: Not  at all  Relationships  . Social connections    Talks on phone: More than three times a week    Gets together: More than three times a week    Attends religious service: Never    Active member of club or organization: No    Attends meetings of clubs or organizations: Never    Relationship status: Married  . Intimate partner violence    Fear of current or ex partner: No    Emotionally abused: No    Physically abused: No    Forced sexual activity: No  Other Topics Concern  . Not on file  Social History Narrative  . Not on file    FAMILY HISTORY: Family History  Problem Relation Age of Onset  . Cancer Paternal Grandfather   . Colon cancer Neg Hx     ALLERGIES:  has No Known  Allergies.  MEDICATIONS:  Current Outpatient Medications  Medication Sig Dispense Refill  . aspirin 81 MG tablet Take 81 mg by mouth daily.    . ferrous sulfate (IRON SUPPLEMENT) 325 (65 FE) MG tablet Take 325 mg by mouth 2 (two) times daily.      . metFORMIN (GLUCOPHAGE-XR) 500 MG 24 hr tablet Take 1 tablet by mouth 2 (two) times daily.    . Multiple Vitamin (MULTIVITAMIN WITH MINERALS) TABS tablet Take 1 tablet by mouth daily.    . pravastatin (PRAVACHOL) 40 MG tablet Take 40 mg by mouth daily.      No current facility-administered medications for this visit.     REVIEW OF SYSTEMS:   Constitutional: Denies fevers, chills or abnormal night sweats Eyes: Denies blurriness of vision, double vision or watery eyes Ears, nose, mouth, throat, and face: Denies mucositis or sore throat Respiratory: Denies cough, dyspnea or wheezes Cardiovascular: Denies palpitation, chest discomfort or lower extremity swelling Gastrointestinal: Positive for constipation.  Denies any nausea or vomiting. Skin: Denies abnormal skin rashes Lymphatics: Denies new lymphadenopathy or easy bruising Neurological: Positive for numbness in both feet from diabetes. Behavioral/Psych: Mood is stable, no new changes  All other systems were reviewed with the patient and are negative.  PHYSICAL EXAMINATION: ECOG PERFORMANCE STATUS: 0 - Asymptomatic  Vitals:   12/07/18 1300  BP: (!) 168/80  Pulse: 100  Resp: 18  Temp: 99 F (37.2 C)  SpO2: 97%   Filed Weights   12/07/18 1300  Weight: (!) 310 lb 3 oz (140.7 kg)    GENERAL:alert, no distress and comfortable SKIN: skin color, texture, turgor are normal, no rashes or significant lesions EYES: normal, conjunctiva are pink and non-injected, sclera clear OROPHARYNX:no exudate, no erythema and lips, buccal mucosa, and tongue normal  NECK: supple, thyroid normal size, non-tender, without nodularity LYMPH:  no palpable lymphadenopathy in the cervical, axillary or  inguinal LUNGS: clear to auscultation and percussion with normal breathing effort HEART: regular rate & rhythm and no murmurs and no lower extremity edema ABDOMEN:abdomen soft, non-tender and normal bowel sounds Musculoskeletal:no cyanosis of digits and no clubbing  PSYCH: alert & oriented x 3 with fluent speech NEURO: no focal motor/sensory deficits  LABORATORY DATA:  I have reviewed the data as listed Lab Results  Component Value Date   WBC 9.8 11/30/2018   HGB 14.2 11/30/2018   HCT 45.9 11/30/2018   MCV 87.3 11/30/2018   PLT 221 11/30/2018     Chemistry      Component Value Date/Time   NA 136 12/01/2018 0252   K 4.8 12/01/2018 0252   CL  98 12/01/2018 0252   CO2 27 12/01/2018 0252   BUN 21 12/01/2018 0252   CREATININE 1.40 (H) 12/01/2018 0252      Component Value Date/Time   CALCIUM 9.3 12/01/2018 0252   ALKPHOS 107 01/23/2016 0530   AST 51 (H) 01/23/2016 0530   ALT 111 (H) 01/23/2016 0530   BILITOT 0.7 01/23/2016 0530       RADIOGRAPHIC STUDIES: I have personally reviewed the radiological images as listed and agreed with the findings in the report. Ct Renal Stone Study  Result Date: 12/01/2018 CLINICAL DATA:  Chronic left flank pain. Upper abdominal pain since last night. EXAM: CT ABDOMEN AND PELVIS WITHOUT CONTRAST TECHNIQUE: Multidetector CT imaging of the abdomen and pelvis was performed following the standard protocol without IV contrast. COMPARISON:  01/20/2016 FINDINGS: Lower chest: Nodular density seen in the right lower lobe on image 15 measuring 1.7 cm. This measures up to 2.4 cm on sagittal image 56. Left lung base clear. No effusions. Heart is normal size. Hepatobiliary: No focal liver abnormality is seen. Status post cholecystectomy. No biliary dilatation. Pancreas: No focal abnormality or ductal dilatation. Spleen: No focal abnormality.  Normal size. Adrenals/Urinary Tract: No adrenal abnormality. No focal renal abnormality. No stones or hydronephrosis.  Urinary bladder is unremarkable. Stomach/Bowel: Stomach, large and small bowel grossly unremarkable. Normal appendix Vascular/Lymphatic: Aortic atherosclerosis. No enlarged abdominal or pelvic lymph nodes. Reproductive: Mildly prominent prostate. Other: No free fluid or free air. Musculoskeletal: No acute bony abnormality. IMPRESSION: Irregular nodular density in the right lower lobe measuring up to 2.4 cm on sagittal imaging, 1.7 cm on axial imaging. Cannot exclude primary lung cancer. Consider further evaluation with dedicated full chest CT with IV contrast or PET CT. Prior cholecystectomy. Aortic atherosclerosis. Mildly prominent prostate. No acute findings in the abdomen or pelvis. Electronically Signed   By: Rolm Baptise M.D.   On: 12/01/2018 00:16   Ct Angio Chest/abd/pel For Dissection W And/or Wo Contrast  Result Date: 12/01/2018 CLINICAL DATA:  Chest pain, left flank pain. EXAM: CT ANGIOGRAPHY CHEST, ABDOMEN AND PELVIS TECHNIQUE: Multidetector CT imaging through the chest, abdomen and pelvis was performed using the standard protocol during bolus administration of intravenous contrast. Multiplanar reconstructed images and MIPs were obtained and reviewed to evaluate the vascular anatomy. CONTRAST:  179mL OMNIPAQUE IOHEXOL 350 MG/ML SOLN COMPARISON:  Noncontrast CT of the abdomen pelvis performed 11/30/2018. FINDINGS: CTA CHEST FINDINGS Cardiovascular: Heart is normal size. Aorta is normal caliber. No evidence of aortic dissection. Scattered coronary artery calcifications in the left anterior descending coronary artery. Mediastinum/Nodes: No mediastinal, hilar, or axillary adenopathy. Trachea and esophagus are unremarkable. Lungs/Pleura: Irregular nodular density in the right lower lobe as seen on earlier abdominal CT measuring up to 2.4 cm on image 85. Lungs are otherwise clear. No effusions. Musculoskeletal: Chest wall soft tissues are unremarkable. No acute bony abnormality. Review of the MIP images  confirms the above findings. CTA ABDOMEN AND PELVIS FINDINGS VASCULAR Aorta: Infrarenal aortic atherosclerosis. No aneurysm or dissection. Celiac: Patent without evidence of aneurysm, dissection, vasculitis or significant stenosis. SMA: Patent without evidence of aneurysm, dissection, vasculitis or significant stenosis. Renals: Calcified plaque at the origin of both renal arteries. Mild narrowing of the right renal artery. No focal stenosis on the left. No dissection. IMA: Patent without evidence of aneurysm, dissection, vasculitis or significant stenosis. Inflow: Atherosclerotic calcifications. No aneurysm, focal stenosis or dissection. Veins: No obvious venous abnormality within the limitations of this arterial phase study. Review of the MIP images confirms the  above findings. NON-VASCULAR Hepatobiliary: Prior cholecystectomy.  No focal hepatic abnormality. Pancreas: No focal abnormality or ductal dilatation. Spleen: No focal abnormality.  Normal size. Adrenals/Urinary Tract: No adrenal abnormality. No focal renal abnormality. No stones or hydronephrosis. Urinary bladder is unremarkable. Stomach/Bowel: Stomach, large and small bowel grossly unremarkable. Normal appendix Lymphatic: No adenopathy Reproductive: Mildly prominent prostate. Other: No free fluid or free air. Musculoskeletal: No acute bony abnormality. Degenerative changes in the lumbar spine. Review of the MIP images confirms the above findings. IMPRESSION: No evidence of aortic aneurysm or dissection. Aortic atherosclerosis.  Scattered coronary artery disease. 2.4 cm irregular nodule in the right lower lobe concerning for possible primary lung cancer. Recommend further characterisation with PET CT. No acute findings in the abdomen or pelvis. Electronically Signed   By: Rolm Baptise M.D.   On: 12/01/2018 02:09    ASSESSMENT & PLAN:  Right lower lobe lung mass 1.  Right lower lobe lung mass: - Presentation to the ER on 11/30/2018 with left flank  pain. - CT abdomen renal study showed possible lung mass. -CT angiogram of the chest, abdomen and pelvis for dissection with and without contrast showed 2.4 cm irregular nodule in the right lower lobe concerning for possible primary lung cancer.  No clear evidence of metastatic disease.  No acute findings in the abdomen or pelvis. - He does not have any left flank pain anymore.  This is likely caused by constipation. - Patient smoked one 1/4 pack of cigarettes for almost 18 years followed by cigars and pipes for 10 years.  He quit smoking 18 to 19 years ago. 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  Denies any asbestos exposure. - Denies any cough, hemoptysis, significant weight loss.  No chest pains reported. - I had prolonged discussion with the patient about the findings on the CT scan. -I have recommended a whole-body PET CT scan and a brain MRI with and without contrast for staging purposes. -We will also obtain PFTs with bronchodilators. -We will see him back after the scans to discuss further plan of action.  2.  Diabetic neuropathy: -He has numbness in the feet from longstanding diabetes.  Orders Placed This Encounter  Procedures  . NM PET Image Initial (PI) Skull Base To Thigh    Standing Status:   Future    Standing Expiration Date:   12/07/2019    Order Specific Question:   ** REASON FOR EXAM (FREE TEXT)    Answer:   Hx; RLL lung nodule    Order Specific Question:   If indicated for the ordered procedure, I authorize the administration of a radiopharmaceutical per Radiology protocol    Answer:   Yes    Order Specific Question:   Preferred imaging location?    Answer:   [facility]    Order Specific Question:   Radiology Contrast Protocol - do NOT remove file path    Answer:   \\[email or web address]  . MR Brain W Contrast    Standing Status:   Future    Standing Expiration Date:   12/07/2019    Order Specific  Question:   ** REASON FOR EXAM (FREE TEXT)    Answer:   Hx; RLL lung nodule; r/o brain mets    Order Specific Question:   If indicated for the ordered procedure, I authorize the administration of contrast media per Radiology protocol    Answer:   Yes    Order Specific Question:  What is the patient's sedation requirement?    Answer:   No Sedation    Order Specific Question:   Does the patient have a pacemaker or implanted devices?    Answer:   No    Order Specific Question:   Use SRS Protocol?    Answer:   Yes    Order Specific Question:   Radiology Contrast Protocol - do NOT remove file path    Answer:   \\[email or web address]    Order Specific Question:   Preferred imaging location?    Answer:   [facility] (table limit-350lbs)  . MR Brain W Wo Contrast    Standing Status:   Future    Standing Expiration Date:   12/07/2019    Order Specific Question:   ** REASON FOR EXAM (FREE TEXT)    Answer:   hx: RLL lung mass; r/o mets    Order Specific Question:   If indicated for the ordered procedure, I authorize the administration of contrast media per Radiology protocol    Answer:   Yes    Order Specific Question:   What is the patient's sedation requirement?    Answer:   No Sedation    Order Specific Question:   Does the patient have a pacemaker or implanted devices?    Answer:   No    Order Specific Question:   Use SRS Protocol?    Answer:   Yes    Order Specific Question:   Radiology Contrast Protocol - do NOT remove file path    Answer:   \\[email or web address]    Order Specific Question:   Preferred imaging location?    Answer:   [facility] (table limit-350lbs)  . Pulmonary Function Test    Standing Status:   Future    Standing Expiration Date:   12/07/2019    Order Specific Question:   Where should this test be performed?    Answer:   [facility]    Order Specific Question:   Full PFT: includes the following: basic  spirometry, spirometry pre & post bronchodilator, diffusion capacity (DLCO), lung volumes    Answer:   Full PFT    All questions were answered. The patient knows to call the clinic with any problems, questions or concerns.     Derek Jack, MD 12/07/2018 1:43 PM

## 2018-12-09 ENCOUNTER — Other Ambulatory Visit (HOSPITAL_COMMUNITY)
Admission: RE | Admit: 2018-12-09 | Discharge: 2018-12-09 | Disposition: A | Discharge status: Home / Self Care | Payer: Medicare Other | Source: Ambulatory Visit | Attending: Hematology | Admitting: Hematology

## 2018-12-09 ENCOUNTER — Other Ambulatory Visit: Payer: Self-pay

## 2018-12-09 DIAGNOSIS — Z1159 Encounter for screening for other viral diseases: Secondary | ICD-10-CM | POA: Insufficient documentation

## 2018-12-10 LAB — NOVEL CORONAVIRUS, NAA (HOSP ORDER, SEND-OUT TO REF LAB; TAT 18-24 HRS): SARS-CoV-2, NAA: NOT DETECTED

## 2018-12-13 ENCOUNTER — Ambulatory Visit (HOSPITAL_COMMUNITY): Payer: Medicare Other

## 2018-12-14 ENCOUNTER — Other Ambulatory Visit: Payer: Self-pay

## 2018-12-14 ENCOUNTER — Ambulatory Visit (HOSPITAL_COMMUNITY)
Admission: RE | Admit: 2018-12-14 | Discharge: 2018-12-14 | Disposition: A | Discharge status: Home / Self Care | Payer: Medicare Other | Source: Ambulatory Visit | Attending: Hematology | Admitting: Hematology

## 2018-12-14 DIAGNOSIS — R918 Other nonspecific abnormal finding of lung field: Secondary | ICD-10-CM | POA: Insufficient documentation

## 2018-12-14 LAB — PULMONARY FUNCTION TEST
DL/VA % pred: 120 %
DL/VA: 5 ml/min/mmHg/L
DLCO cor % pred: 93 %
DLCO cor: 24.14 ml/min/mmHg
DLCO unc % pred: 91 %
DLCO unc: 23.87 ml/min/mmHg
FEF 25-75 Post: 5.28 L/sec
FEF 25-75 Pre: 3.76 L/sec
FEF2575-%Change-Post: 40 %
FEF2575-%Pred-Post: 202 %
FEF2575-%Pred-Pre: 144 %
FEV1-%Change-Post: 10 %
FEV1-%Pred-Post: 98 %
FEV1-%Pred-Pre: 89 %
FEV1-Post: 2.85 L
FEV1-Pre: 2.59 L
FEV1FVC-%Change-Post: 3 %
FEV1FVC-%Pred-Pre: 112 %
FEV6-%Change-Post: 6 %
FEV6-%Pred-Post: 88 %
FEV6-%Pred-Pre: 82 %
FEV6-Post: 3.19 L
FEV6-Pre: 2.99 L
FEV6FVC-%Pred-Post: 104 %
FEV6FVC-%Pred-Pre: 104 %
FVC-%Change-Post: 6 %
FVC-%Pred-Post: 84 %
FVC-%Pred-Pre: 79 %
FVC-Post: 3.19 L
FVC-Pre: 2.99 L
Post FEV1/FVC ratio: 90 %
Post FEV6/FVC ratio: 100 %
Pre FEV1/FVC ratio: 87 %
Pre FEV6/FVC Ratio: 100 %
RV % pred: 97 %
RV: 2.24 L
TLC % pred: 80 %
TLC: 5.51 L

## 2018-12-14 MED ORDER — ALBUTEROL SULFATE (2.5 MG/3ML) 0.083% IN NEBU
2.5000 mg | INHALATION_SOLUTION | Freq: Once | RESPIRATORY_TRACT | Status: AC
  Administered 2018-12-14: 2.5 mg via RESPIRATORY_TRACT

## 2018-12-28 ENCOUNTER — Ambulatory Visit (HOSPITAL_COMMUNITY): Payer: Medicare Other

## 2019-01-02 ENCOUNTER — Ambulatory Visit (HOSPITAL_COMMUNITY)
Admission: RE | Admit: 2019-01-02 | Discharge: 2019-01-02 | Disposition: A | Discharge status: Home / Self Care | Payer: Medicare Other | Source: Ambulatory Visit | Attending: Hematology | Admitting: Hematology

## 2019-01-02 ENCOUNTER — Encounter (HOSPITAL_COMMUNITY): Payer: Self-pay

## 2019-01-02 ENCOUNTER — Other Ambulatory Visit: Payer: Self-pay

## 2019-01-02 DIAGNOSIS — R918 Other nonspecific abnormal finding of lung field: Secondary | ICD-10-CM | POA: Insufficient documentation

## 2019-01-03 ENCOUNTER — Ambulatory Visit (HOSPITAL_COMMUNITY)
Admission: RE | Admit: 2019-01-03 | Discharge: 2019-01-03 | Disposition: A | Discharge status: Home / Self Care | Payer: Medicare Other | Source: Ambulatory Visit | Attending: Hematology | Admitting: Hematology

## 2019-01-03 DIAGNOSIS — R918 Other nonspecific abnormal finding of lung field: Secondary | ICD-10-CM | POA: Diagnosis not present

## 2019-01-03 DIAGNOSIS — C3490 Malignant neoplasm of unspecified part of unspecified bronchus or lung: Secondary | ICD-10-CM | POA: Diagnosis not present

## 2019-01-03 MED ORDER — GADOBUTROL 1 MMOL/ML IV SOLN
10.0000 mL | Freq: Once | INTRAVENOUS | Status: AC | PRN
  Administered 2019-01-03: 10 mL via INTRAVENOUS

## 2019-01-04 ENCOUNTER — Encounter (HOSPITAL_COMMUNITY): Payer: Self-pay | Admitting: Hematology

## 2019-01-04 ENCOUNTER — Inpatient Hospital Stay (HOSPITAL_COMMUNITY): Payer: Medicare Other | Attending: Hematology | Admitting: Hematology

## 2019-01-04 ENCOUNTER — Other Ambulatory Visit: Payer: Self-pay

## 2019-01-04 DIAGNOSIS — Z8781 Personal history of (healed) traumatic fracture: Secondary | ICD-10-CM | POA: Diagnosis not present

## 2019-01-04 DIAGNOSIS — E1140 Type 2 diabetes mellitus with diabetic neuropathy, unspecified: Secondary | ICD-10-CM | POA: Diagnosis not present

## 2019-01-04 DIAGNOSIS — I10 Essential (primary) hypertension: Secondary | ICD-10-CM

## 2019-01-04 DIAGNOSIS — Z87891 Personal history of nicotine dependence: Secondary | ICD-10-CM | POA: Diagnosis not present

## 2019-01-04 DIAGNOSIS — Z7982 Long term (current) use of aspirin: Secondary | ICD-10-CM | POA: Insufficient documentation

## 2019-01-04 DIAGNOSIS — Z809 Family history of malignant neoplasm, unspecified: Secondary | ICD-10-CM | POA: Insufficient documentation

## 2019-01-04 DIAGNOSIS — Z7984 Long term (current) use of oral hypoglycemic drugs: Secondary | ICD-10-CM | POA: Diagnosis not present

## 2019-01-04 DIAGNOSIS — R918 Other nonspecific abnormal finding of lung field: Secondary | ICD-10-CM | POA: Diagnosis not present

## 2019-01-04 DIAGNOSIS — E7800 Pure hypercholesterolemia, unspecified: Secondary | ICD-10-CM | POA: Diagnosis not present

## 2019-01-04 NOTE — Progress Notes (Signed)
Gregory Lindsey, [place] [address]   CLINIC:  Medical Oncology/Hematology  PCP:  Gregory Evens, MD [place] [address] [phone]   REASON FOR VISIT:  Follow-up for right lower lobe lung mass.    INTERVAL HISTORY:  Gregory Lindsey 68 y.o. male seen for follow-up of right lower lobe lung mass.  He presented to the emergency room with left flank pain on 11/30/2018.  CT abdomen renal study showed possible lung mass.  This was confirmed on a CT angiogram done to rule out dissection.  He was an ex-smoker and quit smoking about 18 to 19 years ago.  Denied any cough or hemoptysis.  Denies any fevers, night sweats or weight loss.  He is working part-time job as a Presenter, broadcasting.  He has longstanding neuropathy from diabetes.    REVIEW OF SYSTEMS:  Review of Systems  Neurological: Positive for numbness.  All other systems reviewed and are negative.    PAST MEDICAL/SURGICAL HISTORY:  Past Medical History:  Diagnosis Date  . Diabetes mellitus   . Hypercholesterolemia   . Hypertension   . Pneumonia    Past Surgical History:  Procedure Laterality Date  . CHOLECYSTECTOMY N/A 01/22/2016   Procedure: LAPAROSCOPIC CHOLECYSTECTOMY;  Surgeon: Gregory Signs, MD;  Location: [facility];  Service: General;  Laterality: N/A;  . COLONOSCOPY  2007 [facility]   1 CM SIMPLE ADENOMA  . COLONOSCOPY N/A 04/26/2015   Procedure: COLONOSCOPY;  Surgeon: Gregory Binder, MD;  Location: [facility];  Service: Endoscopy;  Laterality: N/A;  930      SOCIAL HISTORY:  Social History   Socioeconomic History  . Marital status: Married    Spouse name: Not on file  . Number of children: Not on file  . Years of education: Not on file  . Highest education level: Not on file  Occupational History  . Occupation: employed  Scientific laboratory technician  . Financial resource strain: Not very hard  . Food insecurity    Worry: Never true    Inability: Never true  . Transportation  needs    Medical: No    Non-medical: No  Tobacco Use  . Smoking status: Never Smoker  . Smokeless tobacco: Never Used  Substance and Sexual Activity  . Alcohol use: Yes    Alcohol/week: 0.0 standard drinks    Comment: beer 2-3 times per week, 2 shots of scotch on saturday night   . Drug use: No  . Sexual activity: Not Currently  Lifestyle  . Physical activity    Days per week: 0 days    Minutes per session: 0 min  . Stress: Not at all  Relationships  . Social connections    Talks on phone: More than three times a week    Gets together: More than three times a week    Attends religious service: Never    Active member of club or organization: No    Attends meetings of clubs or organizations: Never    Relationship status: Married  . Intimate partner violence    Fear of current or ex partner: No    Emotionally abused: No    Physically abused: No    Forced sexual activity: No  Other Topics Concern  . Not on file  Social History Narrative  . Not on file    FAMILY HISTORY:  Family History  Problem Relation Age of Onset  . Cancer Paternal Grandfather   . Colon cancer Neg  Hx     CURRENT MEDICATIONS:  Outpatient Encounter Medications as of 01/04/2019  Medication Sig  . aspirin 81 MG tablet Take 81 mg by mouth daily.  . ferrous sulfate (IRON SUPPLEMENT) 325 (65 FE) MG tablet Take 325 mg by mouth 2 (two) times daily.    . metFORMIN (GLUCOPHAGE-XR) 500 MG 24 hr tablet Take 1 tablet by mouth 2 (two) times daily.  . Multiple Vitamin (MULTIVITAMIN WITH MINERALS) TABS tablet Take 1 tablet by mouth daily.  . pravastatin (PRAVACHOL) 40 MG tablet Take 40 mg by mouth daily.   . vitamin B-12 (CYANOCOBALAMIN) 500 MCG tablet Take 500 mcg by mouth daily.   No facility-administered encounter medications on file as of 01/04/2019.     ALLERGIES:  No Known Allergies   PHYSICAL EXAM:  ECOG Performance status: 1  Vitals:   01/04/19 1150  BP: (!) 166/84  Pulse: 98  Resp: 18  Temp:  98.4 F (36.9 C)  SpO2: 97%   Filed Weights   01/04/19 1150  Weight: (!) 317 lb 11.2 oz (144.1 kg)    Physical Exam Vitals Lindsey reviewed.  Constitutional:      Appearance: Normal appearance.  Cardiovascular:     Rate and Rhythm: Normal rate and regular rhythm.     Heart sounds: Normal heart sounds.  Pulmonary:     Effort: Pulmonary effort is normal.     Breath sounds: Normal breath sounds.  Abdominal:     General: There is no distension.     Palpations: Abdomen is soft. There is no mass.  Musculoskeletal:        General: Swelling present.  Skin:    General: Skin is warm.  Neurological:     General: No focal deficit present.     Mental Status: He is alert and oriented to person, place, and time.  Psychiatric:        Mood and Affect: Mood normal.        Behavior: Behavior normal.      LABORATORY DATA:  I have reviewed the labs as listed.  CBC    Component Value Date/Time   WBC 9.8 11/30/2018 2222   RBC 5.26 11/30/2018 2222   HGB 14.2 11/30/2018 2222   HCT 45.9 11/30/2018 2222   PLT 221 11/30/2018 2222   MCV 87.3 11/30/2018 2222   MCH 27.0 11/30/2018 2222   MCHC 30.9 11/30/2018 2222   RDW 13.5 11/30/2018 2222   LYMPHSABS 1.4 11/30/2018 2222   MONOABS 0.3 11/30/2018 2222   EOSABS 0.0 11/30/2018 2222   BASOSABS 0.0 11/30/2018 2222   CMP Latest Ref Rng & Units 12/01/2018 11/30/2018 01/23/2016  Glucose 70 - 99 mg/dL 344(H) 406(H) 160(H)  BUN 8 - 23 mg/dL 21 22 14   Creatinine 0.61 - 1.24 mg/dL 1.40(H) 1.51(H) 1.23  Sodium 135 - 145 mmol/L 136 135 137  Potassium 3.5 - 5.1 mmol/L 4.8 3.9 4.0  Chloride 98 - 111 mmol/L 98 95(L) 102  CO2 22 - 32 mmol/L 27 25 24   Calcium 8.9 - 10.3 mg/dL 9.3 9.4 8.4(L)  Total Protein 6.5 - 8.1 g/dL - - 6.9  Total Bilirubin 0.3 - 1.2 mg/dL - - 0.7  Alkaline Phos 38 - 126 U/L - - 107  AST 15 - 41 U/L - - 51(H)  ALT 17 - 63 U/L - - 111(H)       DIAGNOSTIC IMAGING:  I have independently reviewed the scans and discussed with the  patient.   I have reviewed Gregory Lick  LPN's note and agree with the documentation.  I personally performed a face-to-face visit, made revisions and my assessment and plan is as follows.    ASSESSMENT & PLAN:   Right lower lobe lung mass 1.  Right lower lobe lung mass: - Presented to the ER on 11/30/2018 with left flank pain, CT abdomen renal study showed possible lung mass. - CT angiogram of the CAP for dissection with and without contrast showed 2.4 cm irregular nodule in the right lower lobe concerning for possible primary lung cancer.  No evidence of metastatic disease. - He quit smoking 18 to 19 years ago.  He smoked 1/4 pack of cigarettes a day for almost 18 years followed by cigars and pipes for 10 years. 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  Denies any asbestos exposure. - No cough, hemoptysis or significant weight loss. - I have reviewed PFT results with him.  I have reviewed brain MRI images.  We are waiting for final report. -He missed PET CT scan as his blood sugar was high.  Upon further questioning, he reports that he ran out of metformin for almost 2 to 3 months.  He made a follow-up appointment with Dr. Karie Kirks on Monday. -I will go ahead and schedule him for the next PET CT scan on 01/16/2019.  I will see him back after the PET scan.  2.  Diabetic neuropathy: -He has numbness in the feet from longstanding diabetes.   Total time spent is 25 minutes with more than 50% of the time spent face-to-face discussing results, counseling and coordination of care.  Orders placed this encounter:  No orders of the defined types were placed in this encounter.     Derek Jack, MD [place] [phone]

## 2019-01-04 NOTE — Patient Instructions (Addendum)
[place] at [facility] Discharge Instructions  You were seen today by Dr. Delton Coombes. He went over your recent lab, scan results. He will see you back in 2 weeks for labs and follow up.   Thank you for choosing [place] at [facility] to provide your oncology and hematology care.  To afford each patient quality time with our provider, please arrive at least 15 minutes before your scheduled appointment time.   If you have a lab appointment with the [place] please come in thru the  Main Entrance and check in at the main information desk  You need to re-schedule your appointment should you arrive 10 or more minutes late.  We strive to give you quality time with our providers, and arriving late affects you and other patients whose appointments are after yours.  Also, if you no show three or more times for appointments you may be dismissed from the clinic at the providers discretion.     Again, thank you for choosing [facility]  Our hope is that these requests will decrease the amount of time that you wait before being seen by our physicians.       _____________________________________________________________  Should you have questions after your visit to [facility], please contact our office at [phone] between the hours of 8:00 a.m. and 4:30 p.m.  Voicemails left after 4:00 p.m. will not be returned until the following business day.  For prescription refill requests, have your pharmacy contact our office and allow 72 hours.    Cancer Center Support Programs:   > Cancer Support Group  2nd Tuesday of the month 1pm-2pm, Journey Room

## 2019-01-04 NOTE — Assessment & Plan Note (Signed)
1.  Right lower lobe lung mass: - Presented to the ER on 11/30/2018 with left flank pain, CT abdomen renal study showed possible lung mass. - CT angiogram of the CAP for dissection with and without contrast showed 2.4 cm irregular nodule in the right lower lobe concerning for possible primary lung cancer.  No evidence of metastatic disease. - He quit smoking 18 to 19 years ago.  He smoked 1/4 pack of cigarettes a day for almost 18 years followed by cigars and pipes for 10 years. 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  Denies any asbestos exposure. - No cough, hemoptysis or significant weight loss. - I have reviewed PFT results with him.  I have reviewed brain MRI images.  We are waiting for final report. -He missed PET CT scan as his blood sugar was high.  Upon further questioning, he reports that he ran out of metformin for almost 2 to 3 months.  He made a follow-up appointment with Dr. Karie Kirks on Monday. -I will go ahead and schedule him for the next PET CT scan on 01/16/2019.  I will see him back after the PET scan.  2.  Diabetic neuropathy: -He has numbness in the feet from longstanding diabetes.

## 2019-01-10 DIAGNOSIS — E1142 Type 2 diabetes mellitus with diabetic polyneuropathy: Secondary | ICD-10-CM | POA: Diagnosis not present

## 2019-01-10 DIAGNOSIS — Z125 Encounter for screening for malignant neoplasm of prostate: Secondary | ICD-10-CM | POA: Diagnosis not present

## 2019-01-10 DIAGNOSIS — F649 Gender identity disorder, unspecified: Secondary | ICD-10-CM | POA: Diagnosis not present

## 2019-01-10 DIAGNOSIS — I10 Essential (primary) hypertension: Secondary | ICD-10-CM | POA: Diagnosis not present

## 2019-01-10 DIAGNOSIS — R944 Abnormal results of kidney function studies: Secondary | ICD-10-CM | POA: Diagnosis not present

## 2019-01-10 DIAGNOSIS — E1165 Type 2 diabetes mellitus with hyperglycemia: Secondary | ICD-10-CM | POA: Diagnosis not present

## 2019-01-10 DIAGNOSIS — E781 Pure hyperglyceridemia: Secondary | ICD-10-CM | POA: Diagnosis not present

## 2019-01-10 DIAGNOSIS — E119 Type 2 diabetes mellitus without complications: Secondary | ICD-10-CM | POA: Diagnosis not present

## 2019-01-10 DIAGNOSIS — D649 Anemia, unspecified: Secondary | ICD-10-CM | POA: Diagnosis not present

## 2019-01-10 DIAGNOSIS — E782 Mixed hyperlipidemia: Secondary | ICD-10-CM | POA: Diagnosis not present

## 2019-01-16 ENCOUNTER — Other Ambulatory Visit: Payer: Self-pay

## 2019-01-16 ENCOUNTER — Encounter (HOSPITAL_COMMUNITY)
Admission: RE | Admit: 2019-01-16 | Discharge: 2019-01-16 | Disposition: A | Discharge status: Home / Self Care | Payer: Medicare Other | Source: Ambulatory Visit | Attending: Hematology | Admitting: Hematology

## 2019-01-16 DIAGNOSIS — R918 Other nonspecific abnormal finding of lung field: Secondary | ICD-10-CM | POA: Diagnosis not present

## 2019-01-16 MED ORDER — FLUDEOXYGLUCOSE F - 18 (FDG) INJECTION
17.6700 | Freq: Once | INTRAVENOUS | Status: AC | PRN
  Administered 2019-01-16: 18:00:00 17.67 via INTRAVENOUS

## 2019-01-18 ENCOUNTER — Ambulatory Visit (HOSPITAL_COMMUNITY): Payer: Medicare Other | Admitting: Hematology

## 2019-01-18 ENCOUNTER — Inpatient Hospital Stay (HOSPITAL_COMMUNITY): Payer: Medicare Other | Attending: Hematology | Admitting: Hematology

## 2019-01-18 ENCOUNTER — Other Ambulatory Visit: Payer: Self-pay

## 2019-01-18 ENCOUNTER — Encounter (HOSPITAL_COMMUNITY): Payer: Self-pay | Admitting: Hematology

## 2019-01-18 DIAGNOSIS — Z7982 Long term (current) use of aspirin: Secondary | ICD-10-CM | POA: Diagnosis not present

## 2019-01-18 DIAGNOSIS — R918 Other nonspecific abnormal finding of lung field: Secondary | ICD-10-CM | POA: Insufficient documentation

## 2019-01-18 DIAGNOSIS — Z87891 Personal history of nicotine dependence: Secondary | ICD-10-CM | POA: Diagnosis not present

## 2019-01-18 DIAGNOSIS — Z79899 Other long term (current) drug therapy: Secondary | ICD-10-CM | POA: Insufficient documentation

## 2019-01-18 DIAGNOSIS — E1140 Type 2 diabetes mellitus with diabetic neuropathy, unspecified: Secondary | ICD-10-CM | POA: Insufficient documentation

## 2019-01-18 DIAGNOSIS — I10 Essential (primary) hypertension: Secondary | ICD-10-CM | POA: Diagnosis not present

## 2019-01-18 DIAGNOSIS — E7800 Pure hypercholesterolemia, unspecified: Secondary | ICD-10-CM | POA: Diagnosis not present

## 2019-01-18 NOTE — Progress Notes (Signed)
Gregory Lindsey, Gregory Lindsey   CLINIC:  Medical Oncology/Hematology  PCP:  Lemmie Evens, MD [name] [id number] [phone]   REASON FOR VISIT:  Follow-up for right lower lobe lung mass.    INTERVAL HISTORY:  Gregory Lindsey 67 y.o. male seen for follow-up of right lower lobe lung mass.  Denies any cough or hemoptysis.  Denies any fevers, night sweats or weight loss.  Shortness of breath on exertion is stable.  Appetite and energy levels are 75%.  Pain is reported as 0.  Denied any nausea, vomiting or diarrhea or constipation.  Denies any change in bowel habits.    REVIEW OF SYSTEMS:  Review of Systems  Respiratory: Positive for shortness of breath.   All other systems reviewed and are negative.    PAST MEDICAL/SURGICAL HISTORY:  Past Medical History:  Diagnosis Date  . Diabetes mellitus   . Hypercholesterolemia   . Hypertension   . Pneumonia    Past Surgical History:  Procedure Laterality Date  . CHOLECYSTECTOMY N/A 01/22/2016   Procedure: LAPAROSCOPIC CHOLECYSTECTOMY;  Surgeon: Aviva Signs, MD;  Location: [facility] ORS;  Service: General;  Laterality: N/A;  . COLONOSCOPY  2007 SLF   1 CM SIMPLE ADENOMA  . COLONOSCOPY N/A 04/26/2015   Procedure: COLONOSCOPY;  Surgeon: Danie Binder, MD;  Location: [facility] ENDO SUITE;  Service: Endoscopy;  Laterality: N/A;  930      SOCIAL HISTORY:  Social History   Socioeconomic History  . Marital status: Married    Spouse name: Not on file  . Number of children: Not on file  . Years of education: Not on file  . Highest education level: Not on file  Occupational History  . Occupation: employed  Scientific laboratory technician  . Financial resource strain: Not very hard  . Food insecurity    Worry: Never true    Inability: Never true  . Transportation needs    Medical: No    Non-medical: No  Tobacco Use  . Smoking status: Never Smoker  . Smokeless tobacco: Never Used  Substance and  Sexual Activity  . Alcohol use: Yes    Alcohol/week: 0.0 standard drinks    Comment: beer 2-3 times per week, 2 shots of scotch on saturday night   . Drug use: No  . Sexual activity: Not Currently  Lifestyle  . Physical activity    Days per week: 0 days    Minutes per session: 0 min  . Stress: Not at all  Relationships  . Social connections    Talks on phone: More than three times a week    Gets together: More than three times a week    Attends religious service: Never    Active member of club or organization: No    Attends meetings of clubs or organizations: Never    Relationship status: Married  . Intimate partner violence    Fear of current or ex partner: No    Emotionally abused: No    Physically abused: No    Forced sexual activity: No  Other Topics Concern  . Not on file  Social History Narrative  . Not on file    FAMILY HISTORY:  Family History  Problem Relation Age of Onset  . Cancer Paternal Grandfather   . Colon cancer Neg Hx     CURRENT MEDICATIONS:  Outpatient Encounter Medications as of 01/18/2019  Medication Sig  . aspirin 81 MG tablet Take 81 mg  by mouth daily.  . ferrous sulfate (IRON SUPPLEMENT) 325 (65 FE) MG tablet Take 325 mg by mouth 2 (two) times daily.    Marland Kitchen lisinopril (ZESTRIL) 20 MG tablet Take 20 mg by mouth daily.  . metFORMIN (GLUCOPHAGE) 500 MG tablet Take 1,000 mg by mouth 2 (two) times daily.  . metFORMIN (GLUCOPHAGE-XR) 500 MG 24 hr tablet Take 1 tablet by mouth 2 (two) times daily.  . Multiple Vitamin (MULTIVITAMIN WITH MINERALS) TABS tablet Take 1 tablet by mouth daily.  . pravastatin (PRAVACHOL) 40 MG tablet Take 40 mg by mouth daily.   . vitamin B-12 (CYANOCOBALAMIN) 500 MCG tablet Take 500 mcg by mouth daily.   No facility-administered encounter medications on file as of 01/18/2019.     ALLERGIES:  No Known Allergies   PHYSICAL EXAM:  ECOG Performance status: 1  Vitals:   01/18/19 1510  BP: (!) 129/57  Pulse: 78  Resp: 18   Temp: 97.9 F (36.6 C)  SpO2: 98%   Filed Weights   01/18/19 1510  Weight: (!) 313 lb 14.4 oz (142.4 kg)    Physical Exam Vitals signs reviewed.  Cardiovascular:     Rate and Rhythm: Normal rate and regular rhythm.     Heart sounds: Normal heart sounds.  Pulmonary:     Effort: Pulmonary effort is normal.     Breath sounds: Normal breath sounds.  Abdominal:     General: There is no distension.     Palpations: Abdomen is soft. There is no mass.  Skin:    General: Skin is warm.  Neurological:     General: No focal deficit present.     Mental Status: He is oriented to person, place, and time.  Psychiatric:        Mood and Affect: Mood normal.        Behavior: Behavior normal.      LABORATORY DATA:  I have reviewed the labs as listed.  CBC    Component Value Date/Time   WBC 9.8 11/30/2018 2222   RBC 5.26 11/30/2018 2222   HGB 14.2 11/30/2018 2222   HCT 45.9 11/30/2018 2222   PLT 221 11/30/2018 2222   MCV 87.3 11/30/2018 2222   MCH 27.0 11/30/2018 2222   MCHC 30.9 11/30/2018 2222   RDW 13.5 11/30/2018 2222   LYMPHSABS 1.4 11/30/2018 2222   MONOABS 0.3 11/30/2018 2222   EOSABS 0.0 11/30/2018 2222   BASOSABS 0.0 11/30/2018 2222   CMP Latest Ref Rng & Units 12/01/2018 11/30/2018 01/23/2016  Glucose 70 - 99 mg/dL 344(H) 406(H) 160(H)  BUN 8 - 23 mg/dL 21 22 14   Creatinine 0.61 - 1.24 mg/dL 1.40(H) 1.51(H) 1.23  Sodium 135 - 145 mmol/L 136 135 137  Potassium 3.5 - 5.1 mmol/L 4.8 3.9 4.0  Chloride 98 - 111 mmol/L 98 95(L) 102  CO2 22 - 32 mmol/L 27 25 24   Calcium 8.9 - 10.3 mg/dL 9.3 9.4 8.4(L)  Total Protein 6.5 - 8.1 g/dL - - 6.9  Total Bilirubin 0.3 - 1.2 mg/dL - - 0.7  Alkaline Phos 38 - 126 U/L - - 107  AST 15 - 41 U/L - - 51(H)  ALT 17 - 63 U/L - - 111(H)       DIAGNOSTIC IMAGING:  I have independently reviewed the scans and discussed with the patient.   I have reviewed Venita Lick LPN's note and agree with the documentation.  I personally performed  a face-to-face visit, made revisions and my assessment and plan  is as follows.    ASSESSMENT & PLAN:   Right lower lobe lung mass 1.  Right lower lobe lung mass: - Incidental finding on a CT abdomen renal study on 11/30/2018 showing possible lung mass. -CT angiogram CAP for dissection with and without contrast showed 2.4 cm irregular nodule in the right lower lobe concerning for lung primary. - He quit smoking 18 to 19 years ago.  Smoked one fourth of pack of cigarettes a day for almost 18 years followed by cigars and pipes for 10 years. 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  No asbestos exposure. - No fevers, night sweats or weight loss.  No cough or hemoptysis. - MRI of the brain on 01/03/2019 was negative for metastatic disease. -I reviewed PET scan dated 01/16/2019.  There is a 1.5 x 2.4 cm right lower lobe lung mass with SUV of 3.2.  No lymphadenopathy or metastatic disease. -We have ordered PFTs.  I have recommended consultation with Dr. Roxan Hockey for further work-up and possible resection. -I will see him back in 8 to 12 weeks.  2.  Diabetic neuropathy: -She has numbness in the feet from longstanding diabetes which is stable.   Total time spent is 25 minutes with more than 50% of the time spent face-to-face discussing scan results, further plan, counseling and coordination of care.  Orders placed this encounter:  No orders of the defined types were placed in this encounter.     Gregory Jack, MD [place] [phone]

## 2019-01-18 NOTE — Patient Instructions (Addendum)
[place] at [facility] Discharge Instructions  You were seen today by Dr. Delton Coombes. He went over your recent scan results. He will see you back in 2 months for follow up.   Thank you for choosing [place] at [facility] to provide your oncology and hematology care.  To afford each patient quality time with our provider, please arrive at least 15 minutes before your scheduled appointment time.   If you have a lab appointment with the [place] please come in thru the  Main Entrance and check in at the main information desk  You need to re-schedule your appointment should you arrive 10 or more minutes late.  We strive to give you quality time with our providers, and arriving late affects you and other patients whose appointments are after yours.  Also, if you no show three or more times for appointments you may be dismissed from the clinic at the providers discretion.     Again, thank you for choosing [facility].  Our hope is that these requests will decrease the amount of time that you wait before being seen by our physicians.       _____________________________________________________________  Should you have questions after your visit to [facility], please contact our office at [phone] between the hours of 8:00 a.m. and 4:30 p.m.  Voicemails left after 4:00 p.m. will not be returned until the following business day.  For prescription refill requests, have your pharmacy contact our office and allow 72 hours.    Cancer Center Support Programs:   > Cancer Support Group  2nd Tuesday of the month 1pm-2pm, Journey Room

## 2019-01-18 NOTE — Assessment & Plan Note (Signed)
1.  Right lower lobe lung mass: - Incidental finding on a CT abdomen renal study on 11/30/2018 showing possible lung mass. -CT angiogram CAP for dissection with and without contrast showed 2.4 cm irregular nodule in the right lower lobe concerning for lung primary. - He quit smoking 18 to 19 years ago.  Smoked one fourth of pack of cigarettes a day for almost 18 years followed by cigars and pipes for 10 years. 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  No asbestos exposure. - No fevers, night sweats or weight loss.  No cough or hemoptysis. - MRI of the brain on 01/03/2019 was negative for metastatic disease. -I reviewed PET scan dated 01/16/2019.  There is a 1.5 x 2.4 cm right lower lobe lung mass with SUV of 3.2.  No lymphadenopathy or metastatic disease. -We have ordered PFTs.  I have recommended consultation with Dr. Roxan Hockey for further work-up and possible resection. -I will see him back in 8 to 12 weeks.  2.  Diabetic neuropathy: -She has numbness in the feet from longstanding diabetes which is stable.

## 2019-01-20 ENCOUNTER — Encounter: Payer: Self-pay | Admitting: Thoracic Surgery (Cardiothoracic Vascular Surgery)

## 2019-01-20 ENCOUNTER — Institutional Professional Consult (permissible substitution) (INDEPENDENT_AMBULATORY_CARE_PROVIDER_SITE_OTHER): Payer: Medicare Other | Admitting: Thoracic Surgery (Cardiothoracic Vascular Surgery)

## 2019-01-20 ENCOUNTER — Other Ambulatory Visit: Payer: Self-pay

## 2019-01-20 ENCOUNTER — Other Ambulatory Visit: Payer: Self-pay | Admitting: Thoracic Surgery (Cardiothoracic Vascular Surgery)

## 2019-01-20 VITALS — BP 160/80 | HR 96 | Temp 97.8°F | Resp 20 | Ht 70.0 in | Wt 303.0 lb

## 2019-01-20 DIAGNOSIS — R918 Other nonspecific abnormal finding of lung field: Secondary | ICD-10-CM

## 2019-01-20 DIAGNOSIS — Z01818 Encounter for other preprocedural examination: Secondary | ICD-10-CM

## 2019-01-20 DIAGNOSIS — R0602 Shortness of breath: Secondary | ICD-10-CM

## 2019-01-20 DIAGNOSIS — Z6841 Body Mass Index (BMI) 40.0 and over, adult: Secondary | ICD-10-CM | POA: Diagnosis not present

## 2019-01-20 DIAGNOSIS — E119 Type 2 diabetes mellitus without complications: Secondary | ICD-10-CM

## 2019-01-20 DIAGNOSIS — I10 Essential (primary) hypertension: Secondary | ICD-10-CM

## 2019-01-20 DIAGNOSIS — R911 Solitary pulmonary nodule: Secondary | ICD-10-CM | POA: Diagnosis not present

## 2019-01-20 DIAGNOSIS — Z794 Long term (current) use of insulin: Secondary | ICD-10-CM

## 2019-01-20 NOTE — Progress Notes (Signed)
[address]       [place] [address]             [phone]                    [place] Medical Record #[id number] Date of Birth: 12-22-1950  Referring: Derek Jack, MD Primary Care: Lemmie Evens, MD Primary Cardiologist: No primary care provider on file.  Chief Complaint:   No chief complaint on file.   History of Present Illness:    Kadence Mimbs Weisner 68 y.o. male is seen in the office  today for surgical evaluation of a right lower lobe PET Avid 2.4cm pulmonary nodule.  This was found incidentally on imaging during an abdominal pain work-up.  He denies any SOB, cough, chest pain, neurologic symptoms, or weight loss.  He states that he is not very active due to knee problems.  He also states that he likely would be unable to walk up a flight of stairs without some exertional dyspnea.    Smoking Hx: quit smoking 18 to 19 years ago.  Smoked one fourth of pack of cigarettes a day for almost 18 years followed by cigars and pipes for 10 years   Current Activity/ Functional Status:  Patient is independent with mobility/ambulation, transfers, ADL's, IADL's.   Zubrod Score: At the time of surgery this patient's most appropriate activity status/level should be described as: []     0    Normal activity, no symptoms [x]     1    Restricted in physical strenuous activity but ambulatory, able to do out light work []     2    Ambulatory and capable of self care, unable to do work activities, up and about               >50 % of waking hours                              []     3    Only limited self care, in bed greater than 50% of waking hours []     4    Completely disabled, no self care, confined to bed or chair []     5    Moribund   Past Medical History:  Diagnosis Date  . Diabetes mellitus   . Hypercholesterolemia   . Hypertension   . Pneumonia     Past Surgical History:  Procedure Laterality Date  . CHOLECYSTECTOMY N/A  01/22/2016   Procedure: LAPAROSCOPIC CHOLECYSTECTOMY;  Surgeon: Aviva Signs, MD;  Location: [facility] ORS;  Service: General;  Laterality: N/A;  . COLONOSCOPY  2007 SLF   1 CM SIMPLE ADENOMA  . COLONOSCOPY N/A 04/26/2015   Procedure: COLONOSCOPY;  Surgeon: Danie Binder, MD;  Location: [facility] ENDO SUITE;  Service: Endoscopy;  Laterality: N/A;  930     Family History  Problem Relation Age of Onset  . Cancer Paternal Grandfather   . Colon cancer Neg Hx      Social History   Tobacco Use  Smoking Status Never Smoker  Smokeless Tobacco Never Used    Social History   Substance and Sexual Activity  Alcohol Use Yes  . Alcohol/week: 0.0 standard drinks   Comment: beer 2-3 times per week, 2 shots of scotch on saturday night      No Known Allergies  Current Outpatient Medications  Medication Sig Dispense Refill  .  aspirin 81 MG tablet Take 81 mg by mouth daily.    . ferrous sulfate (IRON SUPPLEMENT) 325 (65 FE) MG tablet Take 325 mg by mouth 2 (two) times daily.      Marland Kitchen lisinopril (ZESTRIL) 20 MG tablet Take 20 mg by mouth daily.    . metFORMIN (GLUCOPHAGE) 500 MG tablet Take 1,000 mg by mouth 2 (two) times daily.    . metFORMIN (GLUCOPHAGE-XR) 500 MG 24 hr tablet Take 1 tablet by mouth 2 (two) times daily.    . Multiple Vitamin (MULTIVITAMIN WITH MINERALS) TABS tablet Take 1 tablet by mouth daily.    . pravastatin (PRAVACHOL) 40 MG tablet Take 40 mg by mouth daily.     . vitamin B-12 (CYANOCOBALAMIN) 500 MCG tablet Take 500 mcg by mouth daily.     No current facility-administered medications for this visit.      Review of Systems:  Constitutional: neg Cardiovascular: some exertional dyspnea Pulmonary: negative GI: neg MSK: knee pain Neuro: neg  PHYSICAL EXAMINATION: There were no vitals taken for this visit. General: non-toxic, appears stated age.  Obese   HEENT: NCAT.    no cervical or supraclavicular adenopathy. Neuro: Alert, Oriented X 3. Non focal . Psych: appropriate mood.  Cardiovascular: RRR, no murmur, good distal pulses Pulmonary:  Clear B GI: NT/ND MSK: ambulates with a cane.  Chest wall - lost of soft tissue.   Diagnostic Studies & Laboratory data:     Recent Radiology Findings:   Mr Jeri Cos Wo Contrast  Result Date: 01/04/2019 CLINICAL DATA:  Right lower lobe lung mass, rule out Mets, staging of lung cancer. EXAM: MRI HEAD WITHOUT AND WITH CONTRAST TECHNIQUE: Multiplanar, multiecho pulse sequences of the brain and surrounding structures were obtained without and with intravenous contrast. CONTRAST:  10 mL intravenous Gadavist COMPARISON:  No prior imaging of the brain available for comparison. FINDINGS: Brain: Mildly motion degraded examination. No enhancing lesions are demonstrated to suggest intracranial metastatic disease. Minimal ill-defined T2/FLAIR hyperintensity within the cerebral white matter is nonspecific, but consistent with chronic small vessel ischemic disease. No intracranial mass, intracranial hemorrhage, midline shift or extra-axial collection. Cerebral volume is age appropriate. Vascular: Flow voids within the proximal large vessels. Skull and upper cervical spine: Normal calvarial marrow signal. Nonspecific mildly heterogeneous marrow signal within the imaged upper cervical spine. Sinuses/Orbits: The globes and orbits are normal. Small bilateral maxillary sinus mucous retention cysts. No significant mastoid effusion. IMPRESSION: No evidence of intracranial metastatic disease. Minimal scattered cerebral white matter signal abnormality is nonspecific, but consistent with chronic small vessel ischemic disease. Electronically Signed   By: Kellie Simmering   On: 01/04/2019 13:17   Nm Pet Image Initial (pi) Skull Base To Thigh  Result Date: 01/17/2019 CLINICAL DATA:  Initial treatment strategy for RIGHT lung mass. EXAM: NUCLEAR MEDICINE PET SKULL BASE TO THIGH TECHNIQUE: 17.8 mCi F-18 FDG was injected intravenously. Full-ring PET imaging was performed  from the skull base to thigh after the radiotracer. CT data was obtained and used for attenuation correction and anatomic localization. Fasting blood glucose: 183 mg/dl COMPARISON:  Chest CT 12/01/2018 FINDINGS: Mediastinal blood pool activity: SUV max 3.9 Liver activity: SUV max NA NECK: Incidental CT findings: none CHEST: Focus consolidation in the medial RIGHT lower lobe measures 1.5 x 2.4 cm compared to 1.6 x 2.3 cm for no significant change. This lesion has mild to moderate metabolic activity for size with SUV max equal 3.2. No additional pulmonary nodules. No hypermetabolic mediastinal lymph nodes. Incidental CT  findings: none ABDOMEN/PELVIS: No abnormal hypermetabolic activity within the liver, pancreas, adrenal glands, or spleen. No hypermetabolic lymph nodes in the abdomen or pelvis. Incidental CT findings: Prostate normal SKELETON: No focal hypermetabolic activity to suggest skeletal metastasis. Incidental CT findings: none IMPRESSION: 1. Mild to moderate metabolic activity associated with persistent nodular consolidation in RIGHT lower lobe. Differential includes inflammatory nodule versus bronchogenic carcinoma. As lesion is persistent and metabolic, recommend bronchoscopy for tissue sampling. 2. No evidence of metastatic mediastinal adenopathy or distant metastatic disease. These results will be called to the ordering clinician or representative by the Radiologist Assistant, and communication documented in the PACS or zVision Dashboard. Electronically Signed   By: Suzy Bouchard M.D.   On: 01/17/2019 09:29       6/18 CT chest: 2.4 cm RLL nodule 8/3 PET/CT: 2.4cm RLL nodule with SUV of 3.2.  No nodal or adrenal avidity 7/21 MRI Brain: negative for metastatic disease  I have independently reviewed the above radiology studies  and reviewed the findings with the patient.   Recent Lab Findings: Lab Results  Component Value Date   WBC 9.8 11/30/2018   HGB 14.2 11/30/2018   HCT 45.9  11/30/2018   PLT 221 11/30/2018   GLUCOSE 344 (H) 12/01/2018   ALT 111 (H) 01/23/2016   AST 51 (H) 01/23/2016   NA 136 12/01/2018   K 4.8 12/01/2018   CL 98 12/01/2018   CREATININE 1.40 (H) 12/01/2018   BUN 21 12/01/2018   CO2 27 12/01/2018     PFTs: - FVC: 79% - FEV1: 89% -DLCO: 93%    Assessment / Plan:   2.4cm right lower lobe pulmonary nodule, concerning for primary lung cancer.  I discussed options for biopsy, which included bronchoscopic, image guided, and surgical biopsy.  I have recommended that he undergo a navigational bronchoscopy, followed by a right thoracoscopy, right lower lobectomy if the specimens show NSCLC.  This will be done at the same time.  He will require a chemical stress test prior to surgery.  Risks and benefits were discussed in detail, and he is agreeable to proceed.     I  spent 30 minutes with  the patient face to face and greater then 50% of the time was spent in counseling and coordination of care.    Lajuana Matte 01/20/2019 1:37 PM

## 2019-01-23 ENCOUNTER — Encounter (HOSPITAL_COMMUNITY): Payer: Self-pay | Admitting: Thoracic Surgery (Cardiothoracic Vascular Surgery)

## 2019-01-24 ENCOUNTER — Telehealth (HOSPITAL_COMMUNITY): Payer: Self-pay | Admitting: *Deleted

## 2019-01-24 ENCOUNTER — Encounter (HOSPITAL_COMMUNITY): Payer: Self-pay | Admitting: *Deleted

## 2019-01-24 ENCOUNTER — Encounter (HOSPITAL_COMMUNITY): Payer: Self-pay | Admitting: Thoracic Surgery (Cardiothoracic Vascular Surgery)

## 2019-01-24 NOTE — Telephone Encounter (Signed)
Patient given detailed instructions per Myocardial Perfusion Study Information Sheet for the test on 01/31/19 at 1000. Patient notified to arrive 15 minutes early and that it is imperative to arrive on time for appointment to keep from having the test rescheduled.  If you need to cancel or reschedule your appointment, please call the office within 24 hours of your appointment. . Patient verbalized understanding.Martine Trageser, Ranae Palms

## 2019-01-31 ENCOUNTER — Ambulatory Visit (HOSPITAL_COMMUNITY): Payer: Medicare Other | Attending: Cardiovascular Disease

## 2019-01-31 ENCOUNTER — Other Ambulatory Visit: Payer: Self-pay

## 2019-01-31 DIAGNOSIS — R0602 Shortness of breath: Secondary | ICD-10-CM | POA: Diagnosis not present

## 2019-01-31 LAB — MYOCARDIAL PERFUSION IMAGING
LV dias vol: 109 mL (ref 62–150)
LV sys vol: 59 mL
Peak HR: 112 {beats}/min
Rest HR: 92 {beats}/min
SDS: 0
SRS: 0
SSS: 0
TID: 1.21

## 2019-01-31 MED ORDER — REGADENOSON 0.4 MG/5ML IV SOLN
0.4000 mg | Freq: Once | INTRAVENOUS | Status: AC
  Administered 2019-01-31: 0.4 mg via INTRAVENOUS

## 2019-01-31 MED ORDER — TECHNETIUM TC 99M TETROFOSMIN IV KIT
11.0000 | PACK | Freq: Once | INTRAVENOUS | Status: AC | PRN
  Administered 2019-01-31: 11 via INTRAVENOUS
  Filled 2019-01-31: qty 11

## 2019-01-31 MED ORDER — TECHNETIUM TC 99M TETROFOSMIN IV KIT
33.0000 | PACK | Freq: Once | INTRAVENOUS | Status: AC | PRN
  Administered 2019-01-31: 33 via INTRAVENOUS
  Filled 2019-01-31: qty 33

## 2019-02-11 ENCOUNTER — Encounter: Payer: Self-pay | Admitting: *Deleted

## 2019-02-11 ENCOUNTER — Other Ambulatory Visit: Payer: Self-pay | Admitting: *Deleted

## 2019-02-11 DIAGNOSIS — R911 Solitary pulmonary nodule: Secondary | ICD-10-CM

## 2019-02-13 DIAGNOSIS — R911 Solitary pulmonary nodule: Secondary | ICD-10-CM | POA: Diagnosis not present

## 2019-02-13 DIAGNOSIS — Z87891 Personal history of nicotine dependence: Secondary | ICD-10-CM | POA: Diagnosis not present

## 2019-02-13 DIAGNOSIS — E1165 Type 2 diabetes mellitus with hyperglycemia: Secondary | ICD-10-CM | POA: Diagnosis not present

## 2019-02-13 DIAGNOSIS — E782 Mixed hyperlipidemia: Secondary | ICD-10-CM | POA: Diagnosis not present

## 2019-02-14 DIAGNOSIS — N179 Acute kidney failure, unspecified: Secondary | ICD-10-CM

## 2019-02-14 HISTORY — DX: Acute kidney failure, unspecified: N17.9

## 2019-02-16 NOTE — Pre-Procedure Instructions (Signed)
[place]  02/16/2019      [facility] [place], [place] - [address] [place] [address] Phone: [phone] Fax: [phone]    Your procedure is scheduled on Sept. 9  Report to [facility] Entrance A at 630 A.M.  Call this number if you have problems the morning of surgery:  [phone]   Remember:  Do not eat or drink after midnight.      Take these medicines the morning of surgery with A SIP OF WATER :              Pravastatin (pravachol)             7 days prior to surgery STOP taking any Aspirin (unless otherwise instructed by your surgeon), Aleve, Naproxen, Ibuprofen, Motrin, Advil, Goody's, BC's, all herbal medications, fish oil, and all vitamins.             Follow your surgeon's instructions on when to stop Aspirin.  If no instructions were given by your surgeon then you will need to call the office to get those instructions.              How to Manage Your Diabetes Before and After Surgery  Why is it important to control my blood sugar before and after surgery? . Improving blood sugar levels before and after surgery helps healing and can limit problems. . A way of improving blood sugar control is eating a healthy diet by: o  Eating less sugar and carbohydrates o  Increasing activity/exercise o  Talking with your doctor about reaching your blood sugar goals . High blood sugars (greater than 180 mg/dL) can raise your risk of infections and slow your recovery, so you will need to focus on controlling your diabetes during the weeks before surgery. . Make sure that the doctor who takes care of your diabetes knows about your planned surgery including the date and location.  How do I manage my blood sugar before surgery? . Check your blood sugar at least 4 times a day, starting 2 days before surgery, to make sure that the level is not too high or low. o Check your blood sugar the morning of your surgery when  you wake up and every 2 hours until you get to the Short Stay unit. . If your blood sugar is less than 70 mg/dL, you will need to treat for low blood sugar: o Do not take insulin. o Treat a low blood sugar (less than 70 mg/dL) with  cup of clear juice (cranberry or apple), 4 glucose tablets, OR glucose gel. Recheck blood sugar in 15 minutes after treatment (to make sure it is greater than 70 mg/dL). If your blood sugar is not greater than 70 mg/dL on recheck, call [phone] o  for further instructions. . Report your blood sugar to the short stay nurse when you get to Short Stay.  . If you are admitted to the hospital after surgery: o Your blood sugar will be checked by the staff and you will probably be given insulin after surgery (instead of oral diabetes medicines) to make sure you have good blood sugar levels. o The goal for blood sugar control after surgery is 80-180 mg/dL.       WHAT DO I DO ABOUT MY DIABETES MEDICATION?   Marland Kitchen Do not take oral diabetes medicines (pills) the morning of surgery.  (metformin)       Do not  wear jewelry.  Do not wear lotions, powders, or perfumes, or deodorant.  Do not shave 48 hours prior to surgery.  Men may shave face and neck.  Do not bring valuables to the hospital.  [facility] is not responsible for any belongings or valuables.  Contacts, dentures or bridgework may not be worn into surgery.  Leave your suitcase in the car.  After surgery it may be brought to your room.  For patients admitted to the hospital, discharge time will be determined by your treatment team.  Patients discharged the day of surgery will not be allowed to drive home.    Special instructions:  [facility]- Preparing For Surgery  Before surgery, you can play an important role. Because skin is not sterile, your skin needs to be as free of germs as possible. You can reduce the number of germs on your skin by washing with CHG (chlorahexidine gluconate) Soap before  surgery.  CHG is an antiseptic cleaner which kills germs and bonds with the skin to continue killing germs even after washing.    Oral Hygiene is also important to reduce your risk of infection.  Remember - BRUSH YOUR TEETH THE MORNING OF SURGERY WITH YOUR REGULAR TOOTHPASTE  Please do not use if you have an allergy to CHG or antibacterial soaps. If your skin becomes reddened/irritated stop using the CHG.  Do not shave (including legs and underarms) for at least 48 hours prior to first CHG shower. It is OK to shave your face.  Please follow these instructions carefully.   1. Shower the NIGHT BEFORE SURGERY and the MORNING OF SURGERY with CHG.   2. If you chose to wash your hair, wash your hair first as usual with your normal shampoo.  3. After you shampoo, rinse your hair and body thoroughly to remove the shampoo.  4. Use CHG as you would any other liquid soap. You can apply CHG directly to the skin and wash gently with a scrungie or a clean washcloth.   5. Apply the CHG Soap to your body ONLY FROM THE NECK DOWN.  Do not use on open wounds or open sores. Avoid contact with your eyes, ears, mouth and genitals (private parts). Wash Face and genitals (private parts)  with your normal soap.  6. Wash thoroughly, paying special attention to the area where your surgery will be performed.  7. Thoroughly rinse your body with warm water from the neck down.  8. DO NOT shower/wash with your normal soap after using and rinsing off the CHG Soap.  9. Pat yourself dry with a CLEAN TOWEL.  10. Wear CLEAN PAJAMAS to bed the night before surgery, wear comfortable clothes the morning of surgery  11. Place CLEAN SHEETS on your bed the night of your first shower and DO NOT SLEEP WITH PETS.    Day of Surgery:  Do not apply any deodorants/lotions.  Please wear clean clothes to the hospital/surgery center.   Remember to brush your teeth WITH YOUR REGULAR TOOTHPASTE.    Please read over the following  fact sheets that you were given. Coughing and Deep Breathing, MRSA Information and Surgical Site Infection Prevention

## 2019-02-17 ENCOUNTER — Other Ambulatory Visit: Payer: Self-pay

## 2019-02-17 ENCOUNTER — Encounter (HOSPITAL_COMMUNITY): Payer: Self-pay

## 2019-02-17 ENCOUNTER — Encounter (HOSPITAL_COMMUNITY)
Admission: RE | Admit: 2019-02-17 | Discharge: 2019-02-17 | Disposition: A | Discharge status: Home / Self Care | Payer: Medicare Other | Source: Ambulatory Visit | Attending: Thoracic Surgery (Cardiothoracic Vascular Surgery) | Admitting: Thoracic Surgery (Cardiothoracic Vascular Surgery)

## 2019-02-17 ENCOUNTER — Inpatient Hospital Stay (HOSPITAL_COMMUNITY): Admission: RE | Admit: 2019-02-17 | Payer: Medicare Other | Source: Ambulatory Visit

## 2019-02-17 DIAGNOSIS — R911 Solitary pulmonary nodule: Secondary | ICD-10-CM | POA: Insufficient documentation

## 2019-02-17 DIAGNOSIS — Z01818 Encounter for other preprocedural examination: Secondary | ICD-10-CM | POA: Diagnosis not present

## 2019-02-17 DIAGNOSIS — Z20828 Contact with and (suspected) exposure to other viral communicable diseases: Secondary | ICD-10-CM | POA: Insufficient documentation

## 2019-02-17 LAB — URINALYSIS, ROUTINE W REFLEX MICROSCOPIC
Bacteria, UA: NONE SEEN
Bilirubin Urine: NEGATIVE
Glucose, UA: 50 mg/dL — AB
Hgb urine dipstick: NEGATIVE
Ketones, ur: NEGATIVE mg/dL
Leukocytes,Ua: NEGATIVE
Nitrite: NEGATIVE
Protein, ur: 30 mg/dL — AB
Specific Gravity, Urine: 1.018 (ref 1.005–1.030)
pH: 5 (ref 5.0–8.0)

## 2019-02-17 LAB — COMPREHENSIVE METABOLIC PANEL
ALT: 17 U/L (ref 0–44)
AST: 17 U/L (ref 15–41)
Albumin: 4.1 g/dL (ref 3.5–5.0)
Alkaline Phosphatase: 63 U/L (ref 38–126)
Anion gap: 10 (ref 5–15)
BUN: 27 mg/dL — ABNORMAL HIGH (ref 8–23)
CO2: 19 mmol/L — ABNORMAL LOW (ref 22–32)
Calcium: 9.7 mg/dL (ref 8.9–10.3)
Chloride: 109 mmol/L (ref 98–111)
Creatinine, Ser: 1.69 mg/dL — ABNORMAL HIGH (ref 0.61–1.24)
GFR calc Af Amer: 48 mL/min — ABNORMAL LOW (ref >60)
GFR calc non Af Amer: 41 mL/min — ABNORMAL LOW (ref >60)
Glucose, Bld: 207 mg/dL — ABNORMAL HIGH (ref 70–99)
Potassium: 4.5 mmol/L (ref 3.5–5.1)
Sodium: 138 mmol/L (ref 135–145)
Total Bilirubin: 0.8 mg/dL (ref 0.3–1.2)
Total Protein: 7.6 g/dL (ref 6.5–8.1)

## 2019-02-17 LAB — CBC
HCT: 36.8 % — ABNORMAL LOW (ref 39.0–52.0)
Hemoglobin: 11.3 g/dL — ABNORMAL LOW (ref 13.0–17.0)
MCH: 28.1 pg (ref 26.0–34.0)
MCHC: 30.7 g/dL (ref 30.0–36.0)
MCV: 91.5 fL (ref 80.0–100.0)
Platelets: 194 10*3/uL (ref 150–400)
RBC: 4.02 MIL/uL — ABNORMAL LOW (ref 4.22–5.81)
RDW: 13.1 % (ref 11.5–15.5)
WBC: 7.6 10*3/uL (ref 4.0–10.5)
nRBC: 0 % (ref 0.0–0.2)

## 2019-02-17 LAB — SURGICAL PCR SCREEN
MRSA, PCR: NEGATIVE
Staphylococcus aureus: NEGATIVE

## 2019-02-17 LAB — APTT: aPTT: 27 seconds (ref 24–36)

## 2019-02-17 LAB — GLUCOSE, CAPILLARY: Glucose-Capillary: 195 mg/dL — ABNORMAL HIGH (ref 70–99)

## 2019-02-17 LAB — BLOOD GAS, ARTERIAL
Acid-base deficit: 4.5 mmol/L — ABNORMAL HIGH (ref 0.0–2.0)
Bicarbonate: 19.7 mmol/L — ABNORMAL LOW (ref 20.0–28.0)
Drawn by: 421801
FIO2: 21
O2 Saturation: 95.7 %
Patient temperature: 98.6
pCO2 arterial: 34.4 mmHg (ref 32.0–48.0)
pH, Arterial: 7.377 (ref 7.350–7.450)
pO2, Arterial: 80.9 mmHg — ABNORMAL LOW (ref 83.0–108.0)

## 2019-02-17 LAB — ABO/RH: ABO/RH(D): A POS

## 2019-02-17 LAB — PROTIME-INR
INR: 1 (ref 0.8–1.2)
Prothrombin Time: 13 seconds (ref 11.4–15.2)

## 2019-02-17 NOTE — Pre-Procedure Instructions (Signed)
  Coronavirus Screening Scheduled for COVID test tomorrow. Have you experienced the following symptoms:  Cough yes/no: No Fever (>100.68F)  yes/no: No Runny nose yes/no: No Sore throat yes/no: No Difficulty breathing/shortness of breath  yes/no: No Have you or a family member traveled in the last 14 days and where? yes/no: No  PCP -Dr.  Lemmie Evens  Cardiologist - denies  Gastroenetrologist-Dr Barney Drain  Hematology- Dr Derek Jack  Chest x-ray - DOS  EKG - Today  Stress Test - 01-31-19  ECHO - denies  Cardiac Cath - denies  AICD-denies PM-denies LOOP-denies  Sleep Study - N. STOP BANG=5. Results routed to PCP CPAP - NA  LABS-  ASA-81mg  Pt states he will check with surgeons office if needed to hold .  ERAS-  HA1C- Fasting Blood Sugar -195  Lo-114  Hi-303  Checks Blood Sugar _2____ times a day  Anesthesia-  Pt denies having chest pain, sob, or fever at this time. All instructions explained to the pt, with a verbal understanding of the material. Pt agrees to go over the instructions while at home for a better understanding. Pt also instructed to self quarantine after being tested for COVID-19. The opportunity to ask questions was provided.

## 2019-02-17 NOTE — Progress Notes (Signed)
   02/17/19 1335  OBSTRUCTIVE SLEEP APNEA  Have you ever been diagnosed with sleep apnea through a sleep study? No  Do you snore loudly (loud enough to be heard through closed doors)?  0  Do you often feel tired, fatigued, or sleepy during the daytime (such as falling asleep during driving or talking to someone)? 0  Has anyone observed you stop breathing during your sleep? 0  Do you have, or are you being treated for high blood pressure? 1  BMI more than 35 kg/m2? 1  Age > 50 (1-yes) 1  Neck circumference greater than:Male 16 inches or larger, Male 17inches or larger? 1 (22.5)  Male Gender (Yes=1) 1  Obstructive Sleep Apnea Score 5  Score 5 or greater  Results sent to PCP

## 2019-02-18 ENCOUNTER — Other Ambulatory Visit (HOSPITAL_COMMUNITY)
Admission: RE | Admit: 2019-02-18 | Discharge: 2019-02-18 | Disposition: A | Discharge status: Home / Self Care | Payer: Medicare Other | Source: Ambulatory Visit | Attending: Thoracic Surgery (Cardiothoracic Vascular Surgery) | Admitting: Thoracic Surgery (Cardiothoracic Vascular Surgery)

## 2019-02-18 DIAGNOSIS — Z01818 Encounter for other preprocedural examination: Secondary | ICD-10-CM | POA: Diagnosis not present

## 2019-02-18 DIAGNOSIS — R911 Solitary pulmonary nodule: Secondary | ICD-10-CM | POA: Diagnosis not present

## 2019-02-18 DIAGNOSIS — Z20828 Contact with and (suspected) exposure to other viral communicable diseases: Secondary | ICD-10-CM | POA: Diagnosis not present

## 2019-02-19 LAB — NOVEL CORONAVIRUS, NAA (HOSP ORDER, SEND-OUT TO REF LAB; TAT 18-24 HRS): SARS-CoV-2, NAA: NOT DETECTED

## 2019-02-21 MED ORDER — DEXTROSE 5 % IV SOLN
3.0000 g | INTRAVENOUS | Status: AC
  Administered 2019-02-22: 3 g via INTRAVENOUS
  Filled 2019-02-21: qty 3

## 2019-02-21 NOTE — Anesthesia Preprocedure Evaluation (Addendum)
Anesthesia Evaluation  Patient identified by MRN, date of birth, ID band Patient awake    Reviewed: Allergy & Precautions, NPO status , Patient's Chart, lab work & pertinent test results  Airway Mallampati: I  TM Distance: >3 FB Neck ROM: Full    Dental  (+) Edentulous Upper, Edentulous Lower   Pulmonary neg pulmonary ROS,    Pulmonary exam normal breath sounds clear to auscultation       Cardiovascular hypertension, Pt. on medications Normal cardiovascular exam Rhythm:Regular Rate:Normal     Neuro/Psych negative neurological ROS  negative psych ROS   GI/Hepatic negative GI ROS, Neg liver ROS,   Endo/Other  diabetes, Oral Hypoglycemic AgentsMorbid obesity  Renal/GU Renal InsufficiencyRenal disease     Musculoskeletal negative musculoskeletal ROS (+)   Abdominal (+) + obese,   Peds  Hematology  (+) anemia , HLD   Anesthesia Other Findings RLL PULMONARY NODULE  Reproductive/Obstetrics                            Anesthesia Physical Anesthesia Plan  ASA: III  Anesthesia Plan: General   Post-op Pain Management:    Induction: Intravenous  PONV Risk Score and Plan: 3 and Ondansetron, Dexamethasone, Midazolam and Treatment may vary due to age or medical condition  Airway Management Planned: Double Lumen EBT  Additional Equipment: Arterial line  Intra-op Plan:   Post-operative Plan: Extubation in OR  Informed Consent: I have reviewed the patients History and Physical, chart, labs and discussed the procedure including the risks, benefits and alternatives for the proposed anesthesia with the patient or authorized representative who has indicated his/her understanding and acceptance.       Plan Discussed with: CRNA  Anesthesia Plan Comments:        Anesthesia Quick Evaluation

## 2019-02-22 ENCOUNTER — Inpatient Hospital Stay (HOSPITAL_COMMUNITY): Payer: Medicare Other

## 2019-02-22 ENCOUNTER — Encounter (HOSPITAL_COMMUNITY)
Admission: RE | Disposition: A | Discharge status: Home / Self Care | Payer: Self-pay | Source: Home / Self Care | Attending: Thoracic Surgery (Cardiothoracic Vascular Surgery)

## 2019-02-22 ENCOUNTER — Inpatient Hospital Stay (HOSPITAL_COMMUNITY): Payer: Medicare Other | Admitting: Anesthesiology

## 2019-02-22 ENCOUNTER — Encounter (HOSPITAL_COMMUNITY): Payer: Self-pay

## 2019-02-22 ENCOUNTER — Other Ambulatory Visit: Payer: Self-pay

## 2019-02-22 ENCOUNTER — Ambulatory Visit (HOSPITAL_COMMUNITY)
Admission: RE | Admit: 2019-02-22 | Discharge: 2019-02-22 | Disposition: A | Discharge status: Home / Self Care | Payer: Medicare Other | Attending: Thoracic Surgery (Cardiothoracic Vascular Surgery) | Admitting: Thoracic Surgery (Cardiothoracic Vascular Surgery)

## 2019-02-22 DIAGNOSIS — R846 Abnormal cytological findings in specimens from respiratory organs and thorax: Secondary | ICD-10-CM | POA: Diagnosis not present

## 2019-02-22 DIAGNOSIS — I10 Essential (primary) hypertension: Secondary | ICD-10-CM | POA: Insufficient documentation

## 2019-02-22 DIAGNOSIS — Z6841 Body Mass Index (BMI) 40.0 and over, adult: Secondary | ICD-10-CM | POA: Diagnosis not present

## 2019-02-22 DIAGNOSIS — Z87891 Personal history of nicotine dependence: Secondary | ICD-10-CM | POA: Insufficient documentation

## 2019-02-22 DIAGNOSIS — J984 Other disorders of lung: Secondary | ICD-10-CM | POA: Insufficient documentation

## 2019-02-22 DIAGNOSIS — E119 Type 2 diabetes mellitus without complications: Secondary | ICD-10-CM | POA: Diagnosis not present

## 2019-02-22 DIAGNOSIS — R911 Solitary pulmonary nodule: Secondary | ICD-10-CM | POA: Insufficient documentation

## 2019-02-22 DIAGNOSIS — Z419 Encounter for procedure for purposes other than remedying health state, unspecified: Secondary | ICD-10-CM

## 2019-02-22 DIAGNOSIS — Z79899 Other long term (current) drug therapy: Secondary | ICD-10-CM | POA: Insufficient documentation

## 2019-02-22 DIAGNOSIS — D649 Anemia, unspecified: Secondary | ICD-10-CM | POA: Insufficient documentation

## 2019-02-22 DIAGNOSIS — Z7984 Long term (current) use of oral hypoglycemic drugs: Secondary | ICD-10-CM | POA: Diagnosis not present

## 2019-02-22 DIAGNOSIS — Z7982 Long term (current) use of aspirin: Secondary | ICD-10-CM | POA: Insufficient documentation

## 2019-02-22 HISTORY — PX: VIDEO BRONCHOSCOPY WITH ENDOBRONCHIAL NAVIGATION: SHX6175

## 2019-02-22 LAB — GLUCOSE, CAPILLARY
Glucose-Capillary: 273 mg/dL — ABNORMAL HIGH (ref 70–99)
Glucose-Capillary: 223 mg/dL — ABNORMAL HIGH (ref 70–99)

## 2019-02-22 SURGERY — VIDEO BRONCHOSCOPY WITH ENDOBRONCHIAL NAVIGATION
Anesthesia: General | Site: Chest

## 2019-02-22 MED ORDER — DEXAMETHASONE SODIUM PHOSPHATE 10 MG/ML IJ SOLN
INTRAMUSCULAR | Status: DC | PRN
  Administered 2019-02-22: 5 mg via INTRAVENOUS

## 2019-02-22 MED ORDER — MIDAZOLAM HCL 5 MG/5ML IJ SOLN
INTRAMUSCULAR | Status: DC | PRN
  Administered 2019-02-22: 2 mg via INTRAVENOUS

## 2019-02-22 MED ORDER — SUGAMMADEX SODIUM 500 MG/5ML IV SOLN
INTRAVENOUS | Status: DC | PRN
  Administered 2019-02-22: 400 mg via INTRAVENOUS

## 2019-02-22 MED ORDER — LACTATED RINGERS IV SOLN
INTRAVENOUS | Status: DC | PRN
  Administered 2019-02-22 (×2): via INTRAVENOUS

## 2019-02-22 MED ORDER — SODIUM CHLORIDE 0.9 % IV SOLN
INTRAVENOUS | Status: DC | PRN
  Administered 2019-02-22: 25 ug/min via INTRAVENOUS

## 2019-02-22 MED ORDER — BUPIVACAINE LIPOSOME 1.3 % IJ SUSP
20.0000 mL | Freq: Once | INTRAMUSCULAR | Status: DC
  Filled 2019-02-22: qty 20

## 2019-02-22 MED ORDER — PROPOFOL 10 MG/ML IV BOLUS
INTRAVENOUS | Status: DC | PRN
  Administered 2019-02-22: 130 mg via INTRAVENOUS
  Administered 2019-02-22: 40 mg via INTRAVENOUS

## 2019-02-22 MED ORDER — FENTANYL CITRATE (PF) 100 MCG/2ML IJ SOLN
INTRAMUSCULAR | Status: DC | PRN
  Administered 2019-02-22: 150 ug via INTRAVENOUS
  Administered 2019-02-22: 50 ug via INTRAVENOUS

## 2019-02-22 MED ORDER — ROCURONIUM BROMIDE 10 MG/ML (PF) SYRINGE
PREFILLED_SYRINGE | INTRAVENOUS | Status: AC
  Filled 2019-02-22: qty 10

## 2019-02-22 MED ORDER — EPINEPHRINE PF 1 MG/ML IJ SOLN
INTRAMUSCULAR | Status: AC
  Filled 2019-02-22: qty 1

## 2019-02-22 MED ORDER — FENTANYL CITRATE (PF) 100 MCG/2ML IJ SOLN: 25.0000 ug | INTRAMUSCULAR | Status: DC | PRN

## 2019-02-22 MED ORDER — ACETAMINOPHEN 500 MG PO TABS
1000.0000 mg | ORAL_TABLET | Freq: Once | ORAL | Status: AC
  Administered 2019-02-22: 07:00:00 1000 mg via ORAL
  Filled 2019-02-22: qty 2

## 2019-02-22 MED ORDER — ROCURONIUM BROMIDE 100 MG/10ML IV SOLN: INTRAVENOUS | Status: DC | PRN

## 2019-02-22 MED ORDER — LIDOCAINE 2% (20 MG/ML) 5 ML SYRINGE
INTRAMUSCULAR | Status: AC
  Filled 2019-02-22: qty 5

## 2019-02-22 MED ORDER — FENTANYL CITRATE (PF) 250 MCG/5ML IJ SOLN: INTRAMUSCULAR | Status: DC | PRN

## 2019-02-22 MED ORDER — FENTANYL CITRATE (PF) 250 MCG/5ML IJ SOLN
INTRAMUSCULAR | Status: AC
  Filled 2019-02-22: qty 5

## 2019-02-22 MED ORDER — 0.9 % SODIUM CHLORIDE (POUR BTL) OPTIME
TOPICAL | Status: DC | PRN
  Administered 2019-02-22: 08:00:00 2000 mL

## 2019-02-22 MED ORDER — EPHEDRINE SULFATE-NACL 50-0.9 MG/10ML-% IV SOSY
PREFILLED_SYRINGE | INTRAVENOUS | Status: DC | PRN
  Administered 2019-02-22: 10 mg via INTRAVENOUS

## 2019-02-22 MED ORDER — EPINEPHRINE PF 1 MG/ML IJ SOLN
INTRAMUSCULAR | Status: DC | PRN
  Administered 2019-02-22: 1 mg via ENDOTRACHEOPULMONARY

## 2019-02-22 MED ORDER — INSULIN ASPART 100 UNIT/ML ~~LOC~~ SOLN
SUBCUTANEOUS | Status: AC
  Administered 2019-02-22: 5 [IU] via SUBCUTANEOUS
  Filled 2019-02-22: qty 1

## 2019-02-22 MED ORDER — ONDANSETRON HCL 4 MG/2ML IJ SOLN
INTRAMUSCULAR | Status: DC | PRN
  Administered 2019-02-22: 4 mg via INTRAVENOUS

## 2019-02-22 MED ORDER — DEXAMETHASONE SODIUM PHOSPHATE 10 MG/ML IJ SOLN
INTRAMUSCULAR | Status: AC
  Filled 2019-02-22: qty 1

## 2019-02-22 MED ORDER — INSULIN ASPART 100 UNIT/ML ~~LOC~~ SOLN
5.0000 [IU] | Freq: Once | SUBCUTANEOUS | Status: AC
  Administered 2019-02-22: 08:00:00 5 [IU] via SUBCUTANEOUS
  Filled 2019-02-22: qty 0.05

## 2019-02-22 MED ORDER — ONDANSETRON HCL 4 MG/2ML IJ SOLN: 4.0000 mg | Freq: Once | INTRAMUSCULAR | Status: DC | PRN

## 2019-02-22 MED ORDER — ESMOLOL HCL 100 MG/10ML IV SOLN
INTRAVENOUS | Status: DC | PRN
  Administered 2019-02-22: 20 mg via INTRAVENOUS
  Administered 2019-02-22 (×2): 40 mg via INTRAVENOUS

## 2019-02-22 MED ORDER — PROPOFOL 10 MG/ML IV BOLUS
INTRAVENOUS | Status: AC
  Filled 2019-02-22: qty 20

## 2019-02-22 MED ORDER — LIDOCAINE 2% (20 MG/ML) 5 ML SYRINGE
INTRAMUSCULAR | Status: DC | PRN
  Administered 2019-02-22: 60 mg via INTRAVENOUS

## 2019-02-22 MED ORDER — BUPIVACAINE HCL (PF) 0.5 % IJ SOLN
INTRAMUSCULAR | Status: AC
  Filled 2019-02-22: qty 30

## 2019-02-22 MED ORDER — KETOROLAC TROMETHAMINE 15 MG/ML IJ SOLN: 15.0000 mg | Freq: Once | INTRAMUSCULAR | Status: DC | PRN

## 2019-02-22 MED ORDER — ONDANSETRON HCL 4 MG/2ML IJ SOLN
INTRAMUSCULAR | Status: AC
  Filled 2019-02-22: qty 2

## 2019-02-22 MED ORDER — ESMOLOL HCL 100 MG/10ML IV SOLN
INTRAVENOUS | Status: AC
  Filled 2019-02-22: qty 10

## 2019-02-22 MED ORDER — PHENYLEPHRINE 40 MCG/ML (10ML) SYRINGE FOR IV PUSH (FOR BLOOD PRESSURE SUPPORT)
PREFILLED_SYRINGE | INTRAVENOUS | Status: DC | PRN
  Administered 2019-02-22: 80 ug via INTRAVENOUS

## 2019-02-22 MED ORDER — ROCURONIUM BROMIDE 10 MG/ML (PF) SYRINGE
PREFILLED_SYRINGE | INTRAVENOUS | Status: DC | PRN
  Administered 2019-02-22: 80 mg via INTRAVENOUS

## 2019-02-22 MED ORDER — LIDOCAINE HCL (CARDIAC) PF 100 MG/5ML IV SOSY: PREFILLED_SYRINGE | INTRAVENOUS | Status: DC | PRN

## 2019-02-22 MED ORDER — MIDAZOLAM HCL 2 MG/2ML IJ SOLN
INTRAMUSCULAR | Status: AC
  Filled 2019-02-22: qty 2

## 2019-02-22 SURGICAL SUPPLY — 125 items
ADAPTER BRONCHOSCOPE OLYMPUS (ADAPTER) ×4 IMPLANT
ADAPTER VALVE BIOPSY EBUS (MISCELLANEOUS) IMPLANT
ADPR BSCP OLMPS EDG (ADAPTER) ×2
ADPTR VALVE BIOPSY EBUS (MISCELLANEOUS)
APPLIER CLIP ROT 10 11.4 M/L (STAPLE)
APR CLP MED LRG 11.4X10 (STAPLE)
BAG SPEC RTRVL LRG 6X4 10 (ENDOMECHANICALS)
BLADE CLIPPER SURG (BLADE) ×1 IMPLANT
BRUSH BIOPSY BRONCH 10 SDTNB (MISCELLANEOUS) IMPLANT
BRUSH BIOPSY BRONCH 10MM SDTNB (MISCELLANEOUS)
BRUSH SUPERTRAX BIOPSY (INSTRUMENTS) ×3 IMPLANT
BRUSH SUPERTRAX NDL-TIP CYTO (INSTRUMENTS) ×4 IMPLANT
CANISTER SUCT 3000ML PPV (MISCELLANEOUS) ×4 IMPLANT
CATH THORACIC 28FR (CATHETERS) IMPLANT
CATH THORACIC 28FR RT ANG (CATHETERS) IMPLANT
CATH THORACIC 36FR (CATHETERS) IMPLANT
CATH THORACIC 36FR RT ANG (CATHETERS) IMPLANT
CATH TROCAR 20FR (CATHETERS) IMPLANT
CHANNEL WORK EXTEND EDGE 180 (KITS) IMPLANT
CHANNEL WORK EXTEND EDGE 90 (KITS) ×3 IMPLANT
CLIP APPLIE ROT 10 11.4 M/L (STAPLE) IMPLANT
CLIP VESOCCLUDE MED 6/CT (CLIP) ×4 IMPLANT
CONN ST 1/4X3/8  BEN (MISCELLANEOUS)
CONN ST 1/4X3/8 BEN (MISCELLANEOUS) IMPLANT
CONN Y 3/8X3/8X3/8  BEN (MISCELLANEOUS)
CONN Y 3/8X3/8X3/8 BEN (MISCELLANEOUS) IMPLANT
CONT SPEC 4OZ CLIKSEAL STRL BL (MISCELLANEOUS) ×14 IMPLANT
COVER BACK TABLE 60X90IN (DRAPES) ×4 IMPLANT
COVER SURGICAL LIGHT HANDLE (MISCELLANEOUS) IMPLANT
COVER WAND RF STERILE (DRAPES) ×1 IMPLANT
DISSECTOR BLUNT TIP ENDO 5MM (MISCELLANEOUS) IMPLANT
DRAIN CHANNEL 28F RND 3/8 FF (WOUND CARE) IMPLANT
DRAIN CHANNEL 32F RND 10.7 FF (WOUND CARE) IMPLANT
DRAPE WARM FLUID 44X44 (DRAPES) ×4 IMPLANT
ELECT BLADE 6.5 EXT (BLADE) ×4 IMPLANT
ELECT REM PT RETURN 9FT ADLT (ELECTROSURGICAL) ×4
ELECTRODE REM PT RTRN 9FT ADLT (ELECTROSURGICAL) ×2 IMPLANT
FILTER STRAW FLUID ASPIR (MISCELLANEOUS) ×3 IMPLANT
FORCEPS BIOP SUPERTRX PREMAR (INSTRUMENTS) ×3 IMPLANT
GAUZE SPONGE 4X4 12PLY STRL (GAUZE/BANDAGES/DRESSINGS) ×4 IMPLANT
GLOVE BIO SURGEON STRL SZ7 (GLOVE) ×8 IMPLANT
GLOVE BIOGEL M 7.0 STRL (GLOVE) ×4 IMPLANT
GLOVE BIOGEL M STRL SZ7.5 (GLOVE) ×4 IMPLANT
GLOVE SURG SIGNA 7.5 PF LTX (GLOVE) ×3 IMPLANT
GOWN STRL REUS W/ TWL LRG LVL3 (GOWN DISPOSABLE) ×4 IMPLANT
GOWN STRL REUS W/ TWL XL LVL3 (GOWN DISPOSABLE) ×2 IMPLANT
GOWN STRL REUS W/TWL LRG LVL3 (GOWN DISPOSABLE) ×8
GOWN STRL REUS W/TWL XL LVL3 (GOWN DISPOSABLE) ×4
HANDLE STAPLE ENDO GIA SHORT (STAPLE)
HEMOSTAT SURGICEL 2X14 (HEMOSTASIS) IMPLANT
KIT BASIN OR (CUSTOM PROCEDURE TRAY) ×4 IMPLANT
KIT CLEAN ENDO COMPLIANCE (KITS) ×7 IMPLANT
KIT PROCEDURE EDGE 180 (KITS) ×3 IMPLANT
KIT PROCEDURE EDGE 90 (KITS) IMPLANT
KIT SUCTION CATH 14FR (SUCTIONS) IMPLANT
KIT TURNOVER KIT B (KITS) ×4 IMPLANT
MARKER SKIN DUAL TIP RULER LAB (MISCELLANEOUS) ×4 IMPLANT
NDL SPNL 18GX3.5 QUINCKE PK (NEEDLE) IMPLANT
NDL SUPERTRX PREMARK BIOPSY (NEEDLE) IMPLANT
NEEDLE 22X1 1/2 (OR ONLY) (NEEDLE) ×1 IMPLANT
NEEDLE SPNL 18GX3.5 QUINCKE PK (NEEDLE) IMPLANT
NEEDLE SUPERTRX PREMARK BIOPSY (NEEDLE) ×4 IMPLANT
NS IRRIG 1000ML POUR BTL (IV SOLUTION) ×12 IMPLANT
OIL SILICONE PENTAX (PARTS (SERVICE/REPAIRS)) ×4 IMPLANT
PACK CHEST (CUSTOM PROCEDURE TRAY) ×4 IMPLANT
PACK UNIVERSAL I (CUSTOM PROCEDURE TRAY) ×1 IMPLANT
PAD ARMBOARD 7.5X6 YLW CONV (MISCELLANEOUS) ×8 IMPLANT
PATCHES PATIENT (LABEL) ×12 IMPLANT
POUCH ENDO CATCH II 15MM (MISCELLANEOUS) IMPLANT
POUCH SPECIMEN RETRIEVAL 10MM (ENDOMECHANICALS) IMPLANT
SCISSORS LAP 5X35 DISP (ENDOMECHANICALS) IMPLANT
SEALANT PROGEL (MISCELLANEOUS) IMPLANT
SEALANT SURG COSEAL 4ML (VASCULAR PRODUCTS) IMPLANT
SEALANT SURG COSEAL 8ML (VASCULAR PRODUCTS) IMPLANT
SEALER LIGASURE MARYLAND 30 (ELECTROSURGICAL) ×1 IMPLANT
SET IRRIG TUBING LAPAROSCOPIC (IRRIGATION / IRRIGATOR) IMPLANT
SOL ANTI FOG 6CC (MISCELLANEOUS) ×2 IMPLANT
SOLUTION ANTI FOG 6CC (MISCELLANEOUS) ×2
SPECIMEN JAR MEDIUM (MISCELLANEOUS) IMPLANT
SPONGE INTESTINAL PEANUT (DISPOSABLE) ×8 IMPLANT
SPONGE TONSIL TAPE 1 RFD (DISPOSABLE) ×4 IMPLANT
STAPLER ENDO GIA 12 SHRT THIN (STAPLE) ×1 IMPLANT
STAPLER ENDO GIA 12MM SHORT (STAPLE) IMPLANT
STOPCOCK 4 WAY LG BORE MALE ST (IV SETS) ×7 IMPLANT
SUT MNCRL AB 3-0 PS2 18 (SUTURE) IMPLANT
SUT MON AB 2-0 CT1 36 (SUTURE) IMPLANT
SUT PDS AB 1 CTX 36 (SUTURE) IMPLANT
SUT PROLENE 4 0 RB 1 (SUTURE)
SUT PROLENE 4-0 RB1 .5 CRCL 36 (SUTURE) IMPLANT
SUT SILK  1 MH (SUTURE) ×2
SUT SILK 1 MH (SUTURE) ×1 IMPLANT
SUT SILK 1 TIES 10X30 (SUTURE) ×4 IMPLANT
SUT SILK 2 0 SH (SUTURE) IMPLANT
SUT SILK 2 0SH CR/8 30 (SUTURE) IMPLANT
SUT VIC AB 1 CTX 36 (SUTURE)
SUT VIC AB 1 CTX36XBRD ANBCTR (SUTURE) IMPLANT
SUT VIC AB 2-0 CT1 27 (SUTURE) ×4
SUT VIC AB 2-0 CT1 TAPERPNT 27 (SUTURE) ×3 IMPLANT
SUT VIC AB 3-0 SH 27 (SUTURE) ×8
SUT VIC AB 3-0 SH 27X BRD (SUTURE) ×3 IMPLANT
SUT VICRYL 0 UR6 27IN ABS (SUTURE) ×4 IMPLANT
SUT VICRYL 2 TP 1 (SUTURE) IMPLANT
SYR 10ML LL (SYRINGE) ×7 IMPLANT
SYR 20ML ECCENTRIC (SYRINGE) ×7 IMPLANT
SYR 20ML LL LF (SYRINGE) ×7 IMPLANT
SYR 30ML LL (SYRINGE) ×4 IMPLANT
SYR 50ML LL SCALE MARK (SYRINGE) ×3 IMPLANT
SYR 5ML LL (SYRINGE) ×4 IMPLANT
SYR TB 1ML LUER SLIP (SYRINGE) ×3 IMPLANT
SYSTEM SAHARA CHEST DRAIN ATS (WOUND CARE) ×1 IMPLANT
TAPE CLOTH 4X10 WHT NS (GAUZE/BANDAGES/DRESSINGS) ×4 IMPLANT
TIP APPLICATOR SPRAY EXTEND 16 (VASCULAR PRODUCTS) IMPLANT
TOWEL GREEN STERILE (TOWEL DISPOSABLE) ×4 IMPLANT
TOWEL GREEN STERILE FF (TOWEL DISPOSABLE) ×4 IMPLANT
TRAP SPECIMEN MUCOUS 40CC (MISCELLANEOUS) ×1 IMPLANT
TRAY FOLEY MTR SLVR 16FR STAT (SET/KITS/TRAYS/PACK) ×1 IMPLANT
TROCAR XCEL BLADELESS 5X75MML (TROCAR) ×1 IMPLANT
TUBE CONNECTING 20'X1/4 (TUBING)
TUBE CONNECTING 20X1/4 (TUBING) ×2 IMPLANT
TUBING EXTENTION W/L.L. (IV SETS) ×4 IMPLANT
UNDERPAD 30X30 (UNDERPADS AND DIAPERS) ×4 IMPLANT
VALVE BIOPSY  SINGLE USE (MISCELLANEOUS) ×2
VALVE BIOPSY SINGLE USE (MISCELLANEOUS) ×2 IMPLANT
VALVE SUCTION BRONCHIO DISP (MISCELLANEOUS) ×7 IMPLANT
WATER STERILE IRR 1000ML POUR (IV SOLUTION) ×4 IMPLANT

## 2019-02-22 NOTE — Discharge Summary (Signed)
Physician Discharge Summary  Patient ID: NATTHEW MARLATT MRN: [id number] DOB/AGE: 1951-04-14 68 y.o.  Admit date: 02/22/2019 Discharge date: 02/22/2019  Admission Diagnoses: right lower lobe pulmonary nodule  Discharge Diagnoses:  Active Problems:   * No active hospital problems. *   Discharged Condition: good  Hospital Course: uncomplicated  Consults: None   Discharge Exam: Blood pressure (!) 141/68, pulse 86, temperature (!) 97 F (36.1 C), resp. rate 13, height 5\' 10"  (1.778 m), weight (!) 139.7 kg, SpO2 95 %. Alert NAD RRR, EWOB  Disposition: home     Signed: Lajuana Matte 02/22/2019, 11:57 AM

## 2019-02-22 NOTE — Progress Notes (Signed)
Dr. Roanna Banning notified patient's CBg 273 upon arrival to short stay.  Received order for 5 units novolog SQ.  Will continue to monitor patient.

## 2019-02-22 NOTE — H&P (Signed)
[address]       [place] [address]             [phone]                                                   Makena W [facility] Medical Record #[id number] Date of Birth: August 15, 1950  Referring: Derek Jack, MD Primary Care: Lemmie Evens, MD Primary Cardiologist: No primary care provider on file.  Chief Complaint:   No chief complaint on file.   History of Present Illness:      No events since his clinic appointment  Per my last note: Rhodes Calvert Uhrich 68 y.o. male is seen in the office  today for surgical evaluation of a right lower lobe PET Avid 2.4cm pulmonary nodule.  This was found incidentally on imaging during an abdominal pain work-up.  He denies any SOB, cough, chest pain, neurologic symptoms, or weight loss.  He states that he is not very active due to knee problems.  He also states that he likely would be unable to walk up a flight of stairs without some exertional dyspnea.    Smoking Hx: quit smoking 18 to 19 years ago. Smoked one fourth of pack of cigarettes a day for almost 18 years followed by cigars and pipes for 10 years   Current Activity/ Functional Status:  Patient is independent with mobility/ambulation, transfers, ADL's, IADL's.   Zubrod Score: At the time of surgery this patient's most appropriate activity status/level should be described as: [] ?    0    Normal activity, no symptoms [x] ?    1    Restricted in physical strenuous activity but ambulatory, able to do out light work [] ?    2    Ambulatory and capable of self care, unable to do work activities, up and about               >50 % of waking hours                              [] ?    3    Only limited self care, in bed greater than 50% of waking hours [] ?    4    Completely disabled, no self care, confined to bed or chair [] ?    5    Moribund       Past Medical History:  Diagnosis Date  . Diabetes mellitus   . Hypercholesterolemia    . Hypertension   . Pneumonia          Past Surgical History:  Procedure Laterality Date  . CHOLECYSTECTOMY N/A 01/22/2016   Procedure: LAPAROSCOPIC CHOLECYSTECTOMY;  Surgeon: Aviva Signs, MD;  Location: [facility] ORS;  Service: General;  Laterality: N/A;  . COLONOSCOPY  2007 [facility]   1 CM SIMPLE ADENOMA  . COLONOSCOPY N/A 04/26/2015   Procedure: COLONOSCOPY;  Surgeon: Danie Binder, MD;  Location: [facility] ENDO SUITE;  Service: Endoscopy;  Laterality: N/A;  930          Family History  Problem Relation Age of Onset  . Cancer Paternal Grandfather   . Colon cancer Neg Hx      Social History      Tobacco Use  Smoking Status  Never Smoker  Smokeless Tobacco Never Used    Social History       Substance and Sexual Activity  Alcohol Use Yes  . Alcohol/week: 0.0 standard drinks   Comment: beer 2-3 times per week, 2 shots of scotch on saturday night      No Known Allergies        Current Outpatient Medications  Medication Sig Dispense Refill  . aspirin 81 MG tablet Take 81 mg by mouth daily.    . ferrous sulfate (IRON SUPPLEMENT) 325 (65 FE) MG tablet Take 325 mg by mouth 2 (two) times daily.      Marland Kitchen lisinopril (ZESTRIL) 20 MG tablet Take 20 mg by mouth daily.    . metFORMIN (GLUCOPHAGE) 500 MG tablet Take 1,000 mg by mouth 2 (two) times daily.    . metFORMIN (GLUCOPHAGE-XR) 500 MG 24 hr tablet Take 1 tablet by mouth 2 (two) times daily.    . Multiple Vitamin (MULTIVITAMIN WITH MINERALS) TABS tablet Take 1 tablet by mouth daily.    . pravastatin (PRAVACHOL) 40 MG tablet Take 40 mg by mouth daily.     . vitamin B-12 (CYANOCOBALAMIN) 500 MCG tablet Take 500 mcg by mouth daily.     No current facility-administered medications for this visit.      Review of Systems:  Constitutional: neg Cardiovascular: some exertional dyspnea Pulmonary: negative GI: neg MSK: knee pain Neuro: neg  PHYSICAL EXAMINATION: There were no vitals taken for this  visit. General: non-toxic, appears stated age.  Obese   HEENT: NCAT.    no cervical or supraclavicular adenopathy. Neuro: Alert, Oriented X 3. Non focal . Psych: appropriate mood. Cardiovascular: RRR, no murmur, good distal pulses Pulmonary:  Clear B GI: NT/ND MSK: ambulates with a cane.  Chest wall - lost of soft tissue.   Diagnostic Studies & Laboratory data:     Recent Radiology Findings:    Imaging Results  Mr Jeri Cos Wo Contrast  Result Date: 01/04/2019 CLINICAL DATA:  Right lower lobe lung mass, rule out Mets, staging of lung cancer. EXAM: MRI HEAD WITHOUT AND WITH CONTRAST TECHNIQUE: Multiplanar, multiecho pulse sequences of the brain and surrounding structures were obtained without and with intravenous contrast. CONTRAST:  10 mL intravenous Gadavist COMPARISON:  No prior imaging of the brain available for comparison. FINDINGS: Brain: Mildly motion degraded examination. No enhancing lesions are demonstrated to suggest intracranial metastatic disease. Minimal ill-defined T2/FLAIR hyperintensity within the cerebral white matter is nonspecific, but consistent with chronic small vessel ischemic disease. No intracranial mass, intracranial hemorrhage, midline shift or extra-axial collection. Cerebral volume is age appropriate. Vascular: Flow voids within the proximal large vessels. Skull and upper cervical spine: Normal calvarial marrow signal. Nonspecific mildly heterogeneous marrow signal within the imaged upper cervical spine. Sinuses/Orbits: The globes and orbits are normal. Small bilateral maxillary sinus mucous retention cysts. No significant mastoid effusion. IMPRESSION: No evidence of intracranial metastatic disease. Minimal scattered cerebral white matter signal abnormality is nonspecific, but consistent with chronic small vessel ischemic disease. Electronically Signed   By: Kellie Simmering   On: 01/04/2019 13:17   Nm Pet Image Initial (pi) Skull Base To Thigh  Result Date: 01/17/2019  CLINICAL DATA:  Initial treatment strategy for RIGHT lung mass. EXAM: NUCLEAR MEDICINE PET SKULL BASE TO THIGH TECHNIQUE: 17.8 mCi F-18 FDG was injected intravenously. Full-ring PET imaging was performed from the skull base to thigh after the radiotracer. CT data was obtained and used for attenuation correction and anatomic localization. Fasting blood  glucose: 183 mg/dl COMPARISON:  Chest CT 12/01/2018 FINDINGS: Mediastinal blood pool activity: SUV max 3.9 Liver activity: SUV max NA NECK: Incidental CT findings: none CHEST: Focus consolidation in the medial RIGHT lower lobe measures 1.5 x 2.4 cm compared to 1.6 x 2.3 cm for no significant change. This lesion has mild to moderate metabolic activity for size with SUV max equal 3.2. No additional pulmonary nodules. No hypermetabolic mediastinal lymph nodes. Incidental CT findings: none ABDOMEN/PELVIS: No abnormal hypermetabolic activity within the liver, pancreas, adrenal glands, or spleen. No hypermetabolic lymph nodes in the abdomen or pelvis. Incidental CT findings: Prostate normal SKELETON: No focal hypermetabolic activity to suggest skeletal metastasis. Incidental CT findings: none IMPRESSION: 1. Mild to moderate metabolic activity associated with persistent nodular consolidation in RIGHT lower lobe. Differential includes inflammatory nodule versus bronchogenic carcinoma. As lesion is persistent and metabolic, recommend bronchoscopy for tissue sampling. 2. No evidence of metastatic mediastinal adenopathy or distant metastatic disease. These results will be called to the ordering clinician or representative by the Radiologist Assistant, and communication documented in the PACS or zVision Dashboard. Electronically Signed   By: Suzy Bouchard M.D.   On: 01/17/2019 09:29        6/18 CT chest: 2.4 cm RLL nodule 8/3 PET/CT: 2.4cm RLL nodule with SUV of 3.2.  No nodal or adrenal avidity 7/21 MRI Brain: negative for metastatic disease  I have independently  reviewed the above radiology studies  and reviewed the findings with the patient.   Recent Lab Findings: Recent Labs       Lab Results  Component Value Date   WBC 9.8 11/30/2018   HGB 14.2 11/30/2018   HCT 45.9 11/30/2018   PLT 221 11/30/2018   GLUCOSE 344 (H) 12/01/2018   ALT 111 (H) 01/23/2016   AST 51 (H) 01/23/2016   NA 136 12/01/2018   K 4.8 12/01/2018   CL 98 12/01/2018   CREATININE 1.40 (H) 12/01/2018   BUN 21 12/01/2018   CO2 27 12/01/2018       PFTs: - FVC: 79% - FEV1: 89% -DLCO: 93%    Assessment / Plan:   2.4cm right lower lobe pulmonary nodule, concerning for primary lung cancer.  I discussed options for biopsy, which included bronchoscopic, image guided, and surgical biopsy.  I have recommended that he undergo a navigational bronchoscopy, followed by a right thoracoscopy, right lower lobectomy if the specimens show NSCLC.  This will be done at the same time.  benefits were discussed in detail, and he is agreeable to proceed.      Emmauel Hallums Bary Leriche

## 2019-02-22 NOTE — Anesthesia Procedure Notes (Signed)
Procedure Name: Intubation Date/Time: 02/22/2019 8:42 AM Performed by: Kyung Rudd, CRNA Pre-anesthesia Checklist: Patient identified, Emergency Drugs available, Suction available, Patient being monitored and Timeout performed Patient Re-evaluated:Patient Re-evaluated prior to induction Oxygen Delivery Method: Circle system utilized Preoxygenation: Pre-oxygenation with 100% oxygen Induction Type: IV induction Ventilation: Mask ventilation without difficulty and Oral airway inserted - appropriate to patient size Laryngoscope Size: Mac and 4 Grade View: Grade II Tube type: Oral Tube size: 8.5 mm Number of attempts: 1 Airway Equipment and Method: Stylet Placement Confirmation: ETT inserted through vocal cords under direct vision,  positive ETCO2 and breath sounds checked- equal and bilateral Secured at: 20 cm Tube secured with: Tape Dental Injury: Teeth and Oropharynx as per pre-operative assessment

## 2019-02-22 NOTE — Op Note (Addendum)
     [address]       [place] [address]             [phone]        02/22/2019  Patient: Gregory Buckles Lindsey Pre-operative Diagnosis: right lower lobe pulmonary nodule Post-operative Diagnosis: same Procedure(s): Video bronchoscopy Navigational bronchoscopy with transbronchial biopsy Bronchial lavage  Surgeon(s) and Role:    * Damarko Stitely, Lucile Crater, MD - Primary    * Dr. Roxan Hockey, MD - Assisting  Anesthesia  General EBL: minimal  Blood Administered: none Specimen: right lower lobe FNA, brushings, and biopsy.  Right lower lobe bronchial lavage  Indication: Gregory Dinovo Nimmons67 y.o.maleis seen in the office today for surgical evaluation of a right lower lobe PET Avid 2.4cm pulmonary nodule. This was found incidentally on imaging during an abdominal pain work-up.  Findings: Small segmental bronchus leading to the nodule.  Multiple biopsies through the LG catheter yielded benign bronchial tissue.  We took several direct biopsies as well, but the results were non-diagnostic.  Operative Technique: Planning for the navigational bronchoscopy was done prior to induction.  Anesthesia was induced.  Pre-operative antibiotics were dose, and the patient was prepped and draped in normal sterile fashion.  An appropriate surgical pause was performed.      Flexible fiberoptic bronchoscopy was performed via the endotracheal tube.  It revealed normal endobronchial anatomy with no endobronchial lesions to the level of the subsegmental bronchi.    The bronchoscope was advanced through the ET tube, and the locatable guide for navigation was placed and registration was performed.  There was good correlation of the video and virtual bronchoscopy.  The guide was advanced to the appropriate subsegmental bronchus and advanced to within 2 centimeters of the center of the lesion.  Fluoroscopy was used for all sampling.  Multiple needle aspirations and brushings were performed.  The  locatable guide was reinserted after every third to fourth sample to ensure continued alignment and proximity to the lesion.  While awaiting the results of the aspirations, multiple biopsies were performed.  These were sent for permanent pathology.    The bronchoscope was reinserted and a final inspection was made.  There was no significant ongoing bleeding.  The right lower lobe bronchus was irrigated, and the fluid with aspirated, and sent for specimen.  The bronchoscope was removed.  Additional biopsies were taken for permanent pathology as well as for AFB and fungal cultures.  The bronchoscope was withdrawn.   The patient was extubated in the operating room and taken to the PACU in good condition.

## 2019-02-22 NOTE — Anesthesia Postprocedure Evaluation (Signed)
Anesthesia Post Note  Patient: Gregory Lindsey  Procedure(s) Performed: VIDEO BRONCHOSCOPY WITH ENDOBRONCHIAL NAVIGATION WITH BIOPIES (N/A Chest)     Patient location during evaluation: PACU Anesthesia Type: General Level of consciousness: awake and alert Pain management: pain level controlled Vital Signs Assessment: post-procedure vital signs reviewed and stable Respiratory status: spontaneous breathing, nonlabored ventilation, respiratory function stable and patient connected to nasal cannula oxygen Cardiovascular status: blood pressure returned to baseline and stable Postop Assessment: no apparent nausea or vomiting Anesthetic complications: no    Last Vitals:  Vitals:   02/22/19 1133 02/22/19 1215  BP: (!) 141/68 (!) 141/67  Pulse: 86 85  Resp: 13 14  Temp:    SpO2: 95% 99%    Last Pain:  Vitals:   02/22/19 1215  TempSrc:   PainSc: 0-No pain                 Ryan P Ellender

## 2019-02-22 NOTE — Anesthesia Procedure Notes (Signed)
Arterial Line Insertion Start/End9/02/2019 8:10 AM, 02/22/2019 8:20 AM Performed by: Kyung Rudd, CRNA, CRNA  Patient location: Pre-op. Preanesthetic checklist: patient identified, IV checked, site marked, risks and benefits discussed, surgical consent, monitors and equipment checked, pre-op evaluation and timeout performed Lidocaine 1% used for infiltration and patient sedated Right, radial was placed Catheter size: 20 G Hand hygiene performed , maximum sterile barriers used  and Seldinger technique used Allen's test indicative of satisfactory collateral circulation Attempts: 2 Procedure performed without using ultrasound guided technique. Following insertion, Biopatch and dressing applied. Post procedure assessment: normal  Patient tolerated the procedure well with no immediate complications.

## 2019-02-22 NOTE — Transfer of Care (Signed)
Immediate Anesthesia Transfer of Care Note  Patient: Gregory Lindsey  Procedure(s) Performed: VIDEO BRONCHOSCOPY WITH ENDOBRONCHIAL NAVIGATION WITH BIOPIES (N/A Chest)  Patient Location: PACU  Anesthesia Type:General  Level of Consciousness: awake, alert  and oriented  Airway & Oxygen Therapy: Patient Spontanous Breathing and Patient connected to face mask oxygen  Post-op Assessment: Report given to RN, Post -op Vital signs reviewed and stable and Patient moving all extremities X 4  Post vital signs: Reviewed and stable  Last Vitals:  Vitals Value Taken Time  BP    Temp    Pulse    Resp    SpO2      Last Pain:  Vitals:   02/22/19 0717  TempSrc:   PainSc: 0-No pain         Complications: No apparent anesthesia complications

## 2019-02-23 ENCOUNTER — Encounter (HOSPITAL_COMMUNITY): Payer: Self-pay | Admitting: Thoracic Surgery (Cardiothoracic Vascular Surgery)

## 2019-02-23 LAB — HEMOGLOBIN A1C
Hgb A1c MFr Bld: 9.8 % — ABNORMAL HIGH (ref 4.8–5.6)
Mean Plasma Glucose: 235 mg/dL

## 2019-02-24 ENCOUNTER — Encounter: Payer: Self-pay | Admitting: Thoracic Surgery (Cardiothoracic Vascular Surgery)

## 2019-02-24 ENCOUNTER — Other Ambulatory Visit: Payer: Self-pay | Admitting: *Deleted

## 2019-02-24 ENCOUNTER — Ambulatory Visit (INDEPENDENT_AMBULATORY_CARE_PROVIDER_SITE_OTHER): Payer: Medicare Other | Admitting: Thoracic Surgery (Cardiothoracic Vascular Surgery)

## 2019-02-24 ENCOUNTER — Other Ambulatory Visit: Payer: Self-pay

## 2019-02-24 VITALS — BP 139/79 | HR 95 | Temp 96.8°F | Resp 20 | Ht 70.0 in | Wt 304.0 lb

## 2019-02-24 DIAGNOSIS — R911 Solitary pulmonary nodule: Secondary | ICD-10-CM

## 2019-02-24 DIAGNOSIS — Z9889 Other specified postprocedural states: Secondary | ICD-10-CM

## 2019-02-24 NOTE — Progress Notes (Signed)
     Gregory Lindsey [address]       [place] [address]             [phone]       He presents today after navigational bronchoscopy.  Other results are pending but the intraoperative specimens were nondiagnostic.  He has no complaints today.  Vitals:   02/24/19 0930  BP: 139/79  Pulse: 95  Resp: 20  Temp: (!) 96.8 F (36 C)  SpO2: 95%   NAD alert Regular rate and rhythm Easy work of breathing   Gregory Lindsey comes in today to discuss the results of his biopsy.  Splane to him that I was not confident with the biopsy results and I am still concerned that this is a cancer.  We discussed other potential options which included a CT-guided biopsy versus right VATS lobectomy.  My concern with the CT-guided biopsy is location of the nodule in its proximity to the diaphragm.  Additionally there is a good deal of lung that would have to be traversed to sample the nodule.  Surgical biopsy given the location and the depth of the nodule wedge would not be possible this is the only way to obtain a tissue diagnosis would be with a lobectomy.  My opinion there is a 90 to 95% chance that this is a malignancy and I explained to him that a surgical biopsy would be recommended.  He is agreeable to proceed with that plan given the circumstances.

## 2019-02-28 ENCOUNTER — Other Ambulatory Visit (HOSPITAL_COMMUNITY)
Admission: RE | Admit: 2019-02-28 | Discharge: 2019-02-28 | Disposition: A | Discharge status: Home / Self Care | Payer: Medicare Other | Source: Ambulatory Visit | Attending: Thoracic Surgery (Cardiothoracic Vascular Surgery) | Admitting: Thoracic Surgery (Cardiothoracic Vascular Surgery)

## 2019-02-28 ENCOUNTER — Other Ambulatory Visit: Payer: Self-pay

## 2019-02-28 DIAGNOSIS — Z20828 Contact with and (suspected) exposure to other viral communicable diseases: Secondary | ICD-10-CM | POA: Insufficient documentation

## 2019-02-28 DIAGNOSIS — Z01812 Encounter for preprocedural laboratory examination: Secondary | ICD-10-CM | POA: Insufficient documentation

## 2019-02-28 DIAGNOSIS — R911 Solitary pulmonary nodule: Secondary | ICD-10-CM

## 2019-02-28 LAB — SARS CORONAVIRUS 2 (TAT 6-24 HRS): SARS Coronavirus 2: NEGATIVE

## 2019-03-01 ENCOUNTER — Encounter (HOSPITAL_COMMUNITY)
Admission: RE | Admit: 2019-03-01 | Discharge: 2019-03-01 | Disposition: A | Discharge status: Home / Self Care | Payer: Medicare Other | Source: Ambulatory Visit | Attending: Thoracic Surgery (Cardiothoracic Vascular Surgery) | Admitting: Thoracic Surgery (Cardiothoracic Vascular Surgery)

## 2019-03-01 ENCOUNTER — Other Ambulatory Visit: Payer: Self-pay

## 2019-03-01 ENCOUNTER — Encounter (HOSPITAL_COMMUNITY): Payer: Self-pay | Admitting: Certified Registered"

## 2019-03-01 ENCOUNTER — Ambulatory Visit (HOSPITAL_COMMUNITY)
Admission: RE | Admit: 2019-03-01 | Discharge: 2019-03-01 | Disposition: A | Discharge status: Home / Self Care | Payer: Medicare Other | Source: Ambulatory Visit | Attending: Thoracic Surgery (Cardiothoracic Vascular Surgery) | Admitting: Thoracic Surgery (Cardiothoracic Vascular Surgery)

## 2019-03-01 DIAGNOSIS — Z01818 Encounter for other preprocedural examination: Secondary | ICD-10-CM | POA: Diagnosis not present

## 2019-03-01 DIAGNOSIS — R911 Solitary pulmonary nodule: Secondary | ICD-10-CM | POA: Insufficient documentation

## 2019-03-01 LAB — COMPREHENSIVE METABOLIC PANEL
ALT: 15 U/L (ref 0–44)
AST: 16 U/L (ref 15–41)
Albumin: 4 g/dL (ref 3.5–5.0)
Alkaline Phosphatase: 66 U/L (ref 38–126)
Anion gap: 17 — ABNORMAL HIGH (ref 5–15)
BUN: 35 mg/dL — ABNORMAL HIGH (ref 8–23)
CO2: 16 mmol/L — ABNORMAL LOW (ref 22–32)
Calcium: 9.3 mg/dL (ref 8.9–10.3)
Chloride: 101 mmol/L (ref 98–111)
Creatinine, Ser: 2.41 mg/dL — ABNORMAL HIGH (ref 0.61–1.24)
GFR calc Af Amer: 31 mL/min — ABNORMAL LOW (ref >60)
GFR calc non Af Amer: 27 mL/min — ABNORMAL LOW (ref >60)
Glucose, Bld: 221 mg/dL — ABNORMAL HIGH (ref 70–99)
Potassium: 4.1 mmol/L (ref 3.5–5.1)
Sodium: 134 mmol/L — ABNORMAL LOW (ref 135–145)
Total Bilirubin: 0.7 mg/dL (ref 0.3–1.2)
Total Protein: 7.4 g/dL (ref 6.5–8.1)

## 2019-03-01 LAB — BLOOD GAS, ARTERIAL
Acid-base deficit: 2.9 mmol/L — ABNORMAL HIGH (ref 0.0–2.0)
Bicarbonate: 21.2 mmol/L (ref 20.0–28.0)
Drawn by: 42180
O2 Saturation: 97.8 %
Patient temperature: 98.6
pCO2 arterial: 35.2 mmHg (ref 32.0–48.0)
pH, Arterial: 7.396 (ref 7.350–7.450)
pO2, Arterial: 104 mmHg (ref 83.0–108.0)

## 2019-03-01 LAB — URINALYSIS, ROUTINE W REFLEX MICROSCOPIC
Bilirubin Urine: NEGATIVE
Glucose, UA: 50 mg/dL — AB
Hgb urine dipstick: NEGATIVE
Ketones, ur: NEGATIVE mg/dL
Leukocytes,Ua: NEGATIVE
Nitrite: NEGATIVE
Protein, ur: NEGATIVE mg/dL
Specific Gravity, Urine: 1.015 (ref 1.005–1.030)
pH: 5 (ref 5.0–8.0)

## 2019-03-01 LAB — TYPE AND SCREEN
ABO/RH(D): A POS
ABO/RH(D): A POS
Antibody Screen: NEGATIVE
Antibody Screen: NEGATIVE

## 2019-03-01 LAB — CBC
HCT: 33.5 % — ABNORMAL LOW (ref 39.0–52.0)
Hemoglobin: 10.8 g/dL — ABNORMAL LOW (ref 13.0–17.0)
MCH: 28.7 pg (ref 26.0–34.0)
MCHC: 32.2 g/dL (ref 30.0–36.0)
MCV: 89.1 fL (ref 80.0–100.0)
Platelets: 211 10*3/uL (ref 150–400)
RBC: 3.76 MIL/uL — ABNORMAL LOW (ref 4.22–5.81)
RDW: 12.6 % (ref 11.5–15.5)
WBC: 7.9 10*3/uL (ref 4.0–10.5)
nRBC: 0 % (ref 0.0–0.2)

## 2019-03-01 LAB — PROTIME-INR
INR: 1 (ref 0.8–1.2)
Prothrombin Time: 12.7 seconds (ref 11.4–15.2)

## 2019-03-01 LAB — GLUCOSE, CAPILLARY: Glucose-Capillary: 184 mg/dL — ABNORMAL HIGH (ref 70–99)

## 2019-03-01 LAB — APTT: aPTT: 28 seconds (ref 24–36)

## 2019-03-01 MED ORDER — DEXTROSE 5 % IV SOLN
3.0000 g | INTRAVENOUS | Status: DC
  Filled 2019-03-01: qty 3000

## 2019-03-01 NOTE — Pre-Procedure Instructions (Signed)
Gregory Lindsey  03/01/2019      [place], [place] - [id number] [place] [address] Phone: [phone] Fax: [phone]    Your procedure is scheduled on 03/02/19.  Report to [facility] Admitting at 530 A.M.  Call this number if you have problems the morning of surgery:  [phone]   Remember:  Do not eat or drink after midnight.     Take these medicines the morning of surgery with A SIP OF WATER ----NONE    Do not wear jewelry, make-up or nail polish.  Do not wear lotions, powders, or perfumes, or deodorant.  Do not shave 48 hours prior to surgery.  Men may shave face and neck.  Do not bring valuables to the hospital.  [facility] is not responsible for any belongings or valuables.  Contacts, dentures or bridgework may not be worn into surgery.  Leave your suitcase in the car.  After surgery it may be brought to your room.  For patients admitted to the hospital, discharge time will be determined by your treatment team.  Patients discharged the day of surgery will not be allowed to drive home.    Special instructions:  Do not take any aspirin,anti-inflammatories,vitamins,or herbal supplements 5-7 days prior to surgery.[place] - Preparing for Surgery  Before surgery, you can play an important role.  Because skin is not sterile, your skin needs to be as free of germs as possible.  You can reduce the number of germs on you skin by washing with CHG (chlorahexidine gluconate) soap before surgery.  CHG is an antiseptic cleaner which kills germs and bonds with the skin to continue killing germs even after washing.  Oral Hygiene is also important in reducing the risk of infection.  Remember to brush your teeth with your regular toothpaste the morning of surgery.  Please DO NOT use if you have an allergy to CHG or antibacterial soaps.  If your skin becomes reddened/irritated stop using the CHG and inform your nurse  when you arrive at Short Stay.  Do not shave (including legs and underarms) for at least 48 hours prior to the first CHG shower.  You may shave your face.  Please follow these instructions carefully:   1.  Shower with CHG Soap the night before surgery and the morning of Surgery.  2.  If you choose to wash your hair, wash your hair first as usual with your normal shampoo.  3.  After you shampoo, rinse your hair and body thoroughly to remove the shampoo. 4.  Use CHG as you would any other liquid soap.  You can apply chg directly to the skin and wash gently with a      scrungie or washcloth.           5.  Apply the CHG Soap to your body ONLY FROM THE NECK DOWN.   Do not use on open wounds or open sores. Avoid contact with your eyes, ears, mouth and genitals (private parts).  Wash genitals (private parts) with your normal soap.  6.  Wash thoroughly, paying special attention to the area where your surgery will be performed.  7.  Thoroughly rinse your body with warm water from the neck down.  8.  DO NOT shower/wash with your normal soap after using and rinsing off the CHG Soap.  9.  Pat yourself dry with a clean towel.  10.  Wear clean pajamas.            11.  Place clean sheets on your bed the night of your first shower and do not sleep with pets.  Day of Surgery  Do not apply any lotions/deoderants the morning of surgery.   Please wear clean clothes to the hospital/surgery center. Remember to brush your teeth with toothpaste.    Please read over the following fact sheets that you were given. MRSA Information

## 2019-03-01 NOTE — Progress Notes (Signed)
PCP - DR Sandrea Hughs IN [place] Cardiologist - NA   r  Chest x-ray - TODAY EKG - 9/20 Stress Test - 8/20 ECHO - NA Cardiac Cath - NA  Sleep Study - NA   Fasting Blood Sugar -184  Checks Blood Sugar _DAILY____ times a day        COVID TEST- DONE   Anesthesia review: REVIEW HTN  TALKED WITH ALLISON  Patient denies shortness of breath, fever, cough and chest pain at PAT appointment   Patient verbalized understanding of instructions that were given to them at the PAT appointment. Patient was also instructed that they will need to review over the PAT instructions again at home before surgery.

## 2019-03-01 NOTE — Progress Notes (Signed)
Creat up on preop labs.  Informed patient to hydrate tonight and will recheck in the morning prior to surgery

## 2019-03-01 NOTE — Progress Notes (Addendum)
Anesthesia Chart Review:  Case: [id number] Date/Time: 03/02/19 0715   Procedures:      VIDEO BRONCHOSCOPY (N/A )     VIDEO ASSISTED THORACOSCOPY (VATS)/RIGHT LOWER LOBECTOMY (Right Chest)   Anesthesia type: General   Pre-op diagnosis: PULMONARY NODULE   Location: [facility] OR ROOM 14 / [facility]   Surgeon: Lajuana Matte, MD      DISCUSSION: Patient is a 68 year old male scheduled for the above procedure.  History includes never smoker, diabetes mellitus type 2, hypertension, hypercholesterolemia. He is s/p video bronchoscopy with navigational bronchoscopy and transbronchial biopsy on 02/22/2019 for evaluation of right lower lobe pulmonary nodule. Pathology showed no definitive malignancy and cystology showed atypical cells. Dr. Kipp Brood still concerned of potential malignancy. By notes, location of nodule makes CT-guided biopsy more difficult and the above procedure recommended.   Low risk preoperative stress test 01/31/19.  03/01/19 CXR still pending. PAT labs from this afternoon (03/01/19) showed an increase in his Cr to 2.41 (up from 1.69 02/17/19, 1.40 12/10/08, 1.51 11/30/18). Results noted at 4:40 PM with prompt notification to [place]. She will alert Dr. Kipp Brood for recommendations given surgery planned first thing tomorrow. Also reported A1c earlier this month of 9.8%. (UPDATE: Per Thurmond Butts, Dr. Kipp Brood is having the patient hydrate overnight with plans to recheck in AM 03/02/19. STAT BMET order entered.)    VS: BP (!) 132/55   Pulse 84   Temp 37 C   Resp 20   Ht 5\' 10"  (1.778 m)   Wt (!) 137.2 kg   SpO2 99%   BMI 43.40 kg/m     PROVIDERS: Lemmie Evens, MD is PCP Barney Drain, MD is GI Linton Rump, MD is HEM-ONC   LABS: Preoperative labs noted. See DISCUSSION. A1c on 02/22/19 was 9.8% (prior to his last surgery with Dr. Kipp Brood). Will order DM Coordinator consult.  (all labs ordered are listed, but only abnormal results are displayed)  Labs Reviewed  GLUCOSE,  CAPILLARY - Abnormal; Notable for the following components:      Result Value   Glucose-Capillary 184 (*)    All other components within normal limits  BLOOD GAS, ARTERIAL - Abnormal; Notable for the following components:   Acid-base deficit 2.9 (*)    All other components within normal limits  CBC - Abnormal; Notable for the following components:   RBC 3.76 (*)    Hemoglobin 10.8 (*)    HCT 33.5 (*)    All other components within normal limits  COMPREHENSIVE METABOLIC PANEL - Abnormal; Notable for the following components:   Sodium 134 (*)    CO2 16 (*)    Glucose, Bld 221 (*)    BUN 35 (*)    Creatinine, Ser 2.41 (*)    GFR calc non Af Amer 27 (*)    GFR calc Af Amer 31 (*)    Anion gap 17 (*)    All other components within normal limits  URINALYSIS, ROUTINE W REFLEX MICROSCOPIC - Abnormal; Notable for the following components:   Glucose, UA 50 (*)    All other components within normal limits  APTT  PROTIME-INR  TYPE AND SCREEN     IMAGES: CXR 03/01/19: PENDING   PET Scan 01/16/19: IMPRESSION: 1. Mild to moderate metabolic activity associated with persistent nodular consolidation in RIGHT lower lobe. Differential includes inflammatory nodule versus bronchogenic carcinoma. As lesion is persistent and metabolic, recommend bronchoscopy for tissue sampling. 2. No evidence of metastatic mediastinal adenopathy or distant metastatic disease.  CTA  chest 12/01/18: IMPRESSION: - No evidence of aortic aneurysm or dissection. - Aortic atherosclerosis.  Scattered coronary artery disease. - 2.4 cm irregular nodule in the right lower lobe concerning for possible primary lung cancer. Recommend further characterisation with PET CT. - No acute findings in the abdomen or pelvis.   EKG: 02/17/19: NSR   CV: Nuclear stress test 01/31/19 (ordered by Dr. Kipp Brood):  Nuclear stress EF: 46%. The left ventricular ejection fraction is mildly decreased (45-54%). Visually, his EF appears  to be better than the 46% reported.  This is a low risk study. There is no evidence of infarction of ischemia.   Past Medical History:  Diagnosis Date  . Diabetes mellitus   . Hypercholesterolemia   . Hypertension   . Pneumonia     Past Surgical History:  Procedure Laterality Date  . CHOLECYSTECTOMY N/A 01/22/2016   Procedure: LAPAROSCOPIC CHOLECYSTECTOMY;  Surgeon: Aviva Signs, MD;  Location: [facility] ORS;  Service: General;  Laterality: N/A;  . COLONOSCOPY  2007 [facility]   1 CM SIMPLE ADENOMA  . COLONOSCOPY N/A 04/26/2015   Procedure: COLONOSCOPY;  Surgeon: Danie Binder, MD;  Location: [facility] ENDO SUITE;  Service: Endoscopy;  Laterality: N/A;  930   . VIDEO BRONCHOSCOPY WITH ENDOBRONCHIAL NAVIGATION N/A 02/22/2019   Procedure: VIDEO BRONCHOSCOPY WITH ENDOBRONCHIAL NAVIGATION WITH BIOPIES;  Surgeon: Lajuana Matte, MD;  Location: [facility] OR;  Service: Thoracic;  Laterality: N/A;    MEDICATIONS: . aspirin 81 MG tablet  . cyanocobalamin 2000 MCG tablet  . ferrous sulfate (IRON SUPPLEMENT) 325 (65 FE) MG tablet  . lisinopril (ZESTRIL) 20 MG tablet  . metFORMIN (GLUCOPHAGE) 500 MG tablet  . Multiple Vitamin (MULTIVITAMIN WITH MINERALS) TABS tablet  . pravastatin (PRAVACHOL) 40 MG tablet   No current facility-administered medications for this encounter.    Derrill Memo ON 03/02/2019] ceFAZolin (ANCEF) 3 g in dextrose 5 % 50 mL IVPB    Myra Gianotti, PA-C Surgical Short Stay/Anesthesiology [facility] Phone [phone] [facility] Phone [phone] 03/01/2019 4:43 PM

## 2019-03-02 ENCOUNTER — Inpatient Hospital Stay (HOSPITAL_COMMUNITY)
Admission: RE | Admit: 2019-03-02 | Discharge: 2019-03-09 | DRG: 163 | Disposition: A | Discharge status: Home / Self Care | Payer: Medicare Other | Attending: Thoracic Surgery (Cardiothoracic Vascular Surgery) | Admitting: Thoracic Surgery (Cardiothoracic Vascular Surgery)

## 2019-03-02 ENCOUNTER — Encounter (HOSPITAL_COMMUNITY): Payer: Self-pay

## 2019-03-02 ENCOUNTER — Other Ambulatory Visit: Payer: Self-pay

## 2019-03-02 ENCOUNTER — Inpatient Hospital Stay (HOSPITAL_COMMUNITY): Payer: Medicare Other

## 2019-03-02 ENCOUNTER — Encounter (HOSPITAL_COMMUNITY)
Admission: RE | Disposition: A | Discharge status: Home / Self Care | Payer: Self-pay | Source: Home / Self Care | Attending: Thoracic Surgery (Cardiothoracic Vascular Surgery)

## 2019-03-02 ENCOUNTER — Other Ambulatory Visit: Payer: Self-pay | Admitting: *Deleted

## 2019-03-02 DIAGNOSIS — Z809 Family history of malignant neoplasm, unspecified: Secondary | ICD-10-CM

## 2019-03-02 DIAGNOSIS — D631 Anemia in chronic kidney disease: Secondary | ICD-10-CM | POA: Diagnosis present

## 2019-03-02 DIAGNOSIS — I129 Hypertensive chronic kidney disease with stage 1 through stage 4 chronic kidney disease, or unspecified chronic kidney disease: Secondary | ICD-10-CM | POA: Diagnosis present

## 2019-03-02 DIAGNOSIS — Z6841 Body Mass Index (BMI) 40.0 and over, adult: Secondary | ICD-10-CM

## 2019-03-02 DIAGNOSIS — Z87891 Personal history of nicotine dependence: Secondary | ICD-10-CM

## 2019-03-02 DIAGNOSIS — J90 Pleural effusion, not elsewhere classified: Secondary | ICD-10-CM

## 2019-03-02 DIAGNOSIS — R911 Solitary pulmonary nodule: Secondary | ICD-10-CM | POA: Diagnosis not present

## 2019-03-02 DIAGNOSIS — C3431 Malignant neoplasm of lower lobe, right bronchus or lung: Principal | ICD-10-CM | POA: Diagnosis present

## 2019-03-02 DIAGNOSIS — E1122 Type 2 diabetes mellitus with diabetic chronic kidney disease: Secondary | ICD-10-CM | POA: Diagnosis present

## 2019-03-02 DIAGNOSIS — Z794 Long term (current) use of insulin: Secondary | ICD-10-CM | POA: Diagnosis not present

## 2019-03-02 DIAGNOSIS — R7989 Other specified abnormal findings of blood chemistry: Secondary | ICD-10-CM | POA: Diagnosis not present

## 2019-03-02 DIAGNOSIS — R000 Tachycardia, unspecified: Secondary | ICD-10-CM | POA: Diagnosis present

## 2019-03-02 DIAGNOSIS — J939 Pneumothorax, unspecified: Secondary | ICD-10-CM | POA: Diagnosis not present

## 2019-03-02 DIAGNOSIS — Z7984 Long term (current) use of oral hypoglycemic drugs: Secondary | ICD-10-CM

## 2019-03-02 DIAGNOSIS — E0800 Diabetes mellitus due to underlying condition with hyperosmolarity without nonketotic hyperglycemic-hyperosmolar coma (NKHHC): Secondary | ICD-10-CM | POA: Diagnosis not present

## 2019-03-02 DIAGNOSIS — E119 Type 2 diabetes mellitus without complications: Secondary | ICD-10-CM

## 2019-03-02 DIAGNOSIS — N170 Acute kidney failure with tubular necrosis: Secondary | ICD-10-CM | POA: Diagnosis not present

## 2019-03-02 DIAGNOSIS — N183 Chronic kidney disease, stage 3 (moderate): Secondary | ICD-10-CM | POA: Diagnosis present

## 2019-03-02 DIAGNOSIS — Z8701 Personal history of pneumonia (recurrent): Secondary | ICD-10-CM | POA: Diagnosis not present

## 2019-03-02 DIAGNOSIS — I10 Essential (primary) hypertension: Secondary | ICD-10-CM | POA: Diagnosis present

## 2019-03-02 DIAGNOSIS — N179 Acute kidney failure, unspecified: Secondary | ICD-10-CM | POA: Diagnosis not present

## 2019-03-02 DIAGNOSIS — E785 Hyperlipidemia, unspecified: Secondary | ICD-10-CM | POA: Diagnosis present

## 2019-03-02 DIAGNOSIS — N400 Enlarged prostate without lower urinary tract symptoms: Secondary | ICD-10-CM | POA: Diagnosis present

## 2019-03-02 DIAGNOSIS — R918 Other nonspecific abnormal finding of lung field: Secondary | ICD-10-CM

## 2019-03-02 DIAGNOSIS — Z902 Acquired absence of lung [part of]: Secondary | ICD-10-CM | POA: Diagnosis not present

## 2019-03-02 DIAGNOSIS — Z20828 Contact with and (suspected) exposure to other viral communicable diseases: Secondary | ICD-10-CM | POA: Diagnosis present

## 2019-03-02 DIAGNOSIS — J9383 Other pneumothorax: Secondary | ICD-10-CM | POA: Diagnosis not present

## 2019-03-02 DIAGNOSIS — E1169 Type 2 diabetes mellitus with other specified complication: Secondary | ICD-10-CM

## 2019-03-02 DIAGNOSIS — J9811 Atelectasis: Secondary | ICD-10-CM | POA: Diagnosis not present

## 2019-03-02 LAB — GLUCOSE, CAPILLARY
Glucose-Capillary: 141 mg/dL — ABNORMAL HIGH (ref 70–99)
Glucose-Capillary: 143 mg/dL — ABNORMAL HIGH (ref 70–99)
Glucose-Capillary: 171 mg/dL — ABNORMAL HIGH (ref 70–99)
Glucose-Capillary: 177 mg/dL — ABNORMAL HIGH (ref 70–99)
Glucose-Capillary: 178 mg/dL — ABNORMAL HIGH (ref 70–99)
Glucose-Capillary: 217 mg/dL — ABNORMAL HIGH (ref 70–99)

## 2019-03-02 LAB — BASIC METABOLIC PANEL
Anion gap: 13 (ref 5–15)
BUN: 37 mg/dL — ABNORMAL HIGH (ref 8–23)
CO2: 22 mmol/L (ref 22–32)
Calcium: 8.8 mg/dL — ABNORMAL LOW (ref 8.9–10.3)
Chloride: 97 mmol/L — ABNORMAL LOW (ref 98–111)
Creatinine, Ser: 2.65 mg/dL — ABNORMAL HIGH (ref 0.61–1.24)
GFR calc Af Amer: 28 mL/min — ABNORMAL LOW (ref >60)
GFR calc non Af Amer: 24 mL/min — ABNORMAL LOW (ref >60)
Glucose, Bld: 179 mg/dL — ABNORMAL HIGH (ref 70–99)
Potassium: 3.6 mmol/L (ref 3.5–5.1)
Sodium: 132 mmol/L — ABNORMAL LOW (ref 135–145)

## 2019-03-02 LAB — BRAIN NATRIURETIC PEPTIDE: B Natriuretic Peptide: 17.6 pg/mL (ref 0.0–100.0)

## 2019-03-02 SURGERY — BRONCHOSCOPY, VIDEO-ASSISTED
Anesthesia: General

## 2019-03-02 MED ORDER — LIDOCAINE 2% (20 MG/ML) 5 ML SYRINGE
INTRAMUSCULAR | Status: AC
  Filled 2019-03-02: qty 5

## 2019-03-02 MED ORDER — AMLODIPINE BESYLATE 5 MG PO TABS
5.0000 mg | ORAL_TABLET | Freq: Every day | ORAL | Status: DC
  Administered 2019-03-02 – 2019-03-08 (×6): 5 mg via ORAL
  Filled 2019-03-02 (×6): qty 1

## 2019-03-02 MED ORDER — ENOXAPARIN SODIUM 30 MG/0.3ML ~~LOC~~ SOLN
30.0000 mg | SUBCUTANEOUS | Status: DC
  Administered 2019-03-02 – 2019-03-08 (×7): 30 mg via SUBCUTANEOUS
  Filled 2019-03-02 (×7): qty 0.3

## 2019-03-02 MED ORDER — PROPOFOL 10 MG/ML IV BOLUS
INTRAVENOUS | Status: AC
  Filled 2019-03-02: qty 20

## 2019-03-02 MED ORDER — LIVING WELL WITH DIABETES BOOK
Freq: Once | Status: DC
  Filled 2019-03-02: qty 1

## 2019-03-02 MED ORDER — PHENYLEPHRINE HCL (PRESSORS) 10 MG/ML IV SOLN
INTRAVENOUS | Status: AC
  Filled 2019-03-02: qty 1

## 2019-03-02 MED ORDER — HYDRALAZINE HCL 20 MG/ML IJ SOLN: 10.0000 mg | Freq: Four times a day (QID) | INTRAMUSCULAR | Status: DC | PRN

## 2019-03-02 MED ORDER — HYDRALAZINE HCL 20 MG/ML IJ SOLN: 5.0000 mg | INTRAMUSCULAR | Status: DC | PRN

## 2019-03-02 MED ORDER — ASPIRIN EC 81 MG PO TBEC
81.0000 mg | DELAYED_RELEASE_TABLET | Freq: Every day | ORAL | Status: DC
  Administered 2019-03-02 – 2019-03-09 (×7): 81 mg via ORAL
  Filled 2019-03-02 (×7): qty 1

## 2019-03-02 MED ORDER — MIDAZOLAM HCL 2 MG/2ML IJ SOLN
INTRAMUSCULAR | Status: AC
  Filled 2019-03-02: qty 2

## 2019-03-02 MED ORDER — PRAVASTATIN SODIUM 40 MG PO TABS
40.0000 mg | ORAL_TABLET | Freq: Every day | ORAL | Status: DC
  Administered 2019-03-02 – 2019-03-08 (×7): 40 mg via ORAL
  Filled 2019-03-02 (×7): qty 1

## 2019-03-02 MED ORDER — INSULIN ASPART 100 UNIT/ML ~~LOC~~ SOLN
0.0000 [IU] | SUBCUTANEOUS | Status: DC
  Administered 2019-03-02: 8 [IU] via SUBCUTANEOUS
  Administered 2019-03-02 – 2019-03-03 (×3): 2 [IU] via SUBCUTANEOUS
  Administered 2019-03-03: 13:00:00 4 [IU] via SUBCUTANEOUS
  Administered 2019-03-03: 22:00:00 2 [IU] via SUBCUTANEOUS
  Administered 2019-03-03: 18:00:00 8 [IU] via SUBCUTANEOUS
  Administered 2019-03-04: 13:00:00 12 [IU] via SUBCUTANEOUS
  Administered 2019-03-04 (×2): 2 [IU] via SUBCUTANEOUS

## 2019-03-02 MED ORDER — BUPIVACAINE LIPOSOME 1.3 % IJ SUSP
20.0000 mL | INTRAMUSCULAR | Status: DC
  Filled 2019-03-02: qty 20

## 2019-03-02 MED ORDER — LACTATED RINGERS IV SOLN
INTRAVENOUS | Status: DC
  Administered 2019-03-02 – 2019-03-05 (×6): via INTRAVENOUS

## 2019-03-02 MED ORDER — BUPIVACAINE HCL (PF) 0.5 % IJ SOLN
INTRAMUSCULAR | Status: AC
  Filled 2019-03-02: qty 30

## 2019-03-02 MED ORDER — ROCURONIUM BROMIDE 10 MG/ML (PF) SYRINGE
PREFILLED_SYRINGE | INTRAVENOUS | Status: AC
  Filled 2019-03-02: qty 10

## 2019-03-02 MED ORDER — FENTANYL CITRATE (PF) 250 MCG/5ML IJ SOLN
INTRAMUSCULAR | Status: AC
  Filled 2019-03-02: qty 5

## 2019-03-02 NOTE — Progress Notes (Signed)
     [address]       [place] [address]             [phone]       Creatinine continues to go up today Pt admits that he hasn't been taking his blood pressure medicine, and has had SBP in the 170s. Given his AKI, surgery will be postponed. He will require admission for medical optimization.   Anna Livers Bary Leriche

## 2019-03-02 NOTE — Progress Notes (Signed)
The proposed treatment discussed in cancer conference 03/02/19 is for discussion purpose only and is not a binding recommendation.  The patient was no physically examined nor present for their treatment options.  Therefore, final treatment plans cannot be decided.

## 2019-03-02 NOTE — Progress Notes (Signed)
RN not available to give report. Will try again later.

## 2019-03-02 NOTE — Consult Note (Signed)
Medical Consultation   Gregory Lindsey  WUG:[id number]  DOB: 08-Mar-1951  DOA: 03/02/2019  PCP: Lemmie Evens, MD   Outpatient Specialists: [place]; [facility] - oncology    Requesting physician: Kipp Brood  Reason for consultation: AKI, HTN management   History of Present Illness: Gregory Lindsey is an 68 y.o. male with h/o HTN, HLD, and DM who had a bronch by Dr. Kipp Brood (CVTS) on 9/9 for a RLL PET-avid 4.2 cm pulmonary nodule.  Path was nondiagostic and due to high level of concern for cancer, repeat bronch was scheduled for today.  However, patient has had progressive worsening of labs (Na++ 138 -> 134 -> 132; Glucose 179-221; BUN/Creatinine 27/1.69 -> 35/2.41 -> 37/2.65).  Patient reported he is not taking his BP medication and his SBP is in the 170s.    Patient reports that he had abdominal pain and went to the ER.  They found a spot on his lung on his CT scan.  Abdominal pain got better without treatment.  He was sent to Dr. Kipp Brood and they haven't been able to determine if it is cancer.  He came last week for a biopsy and it was non-diagnostic so he returned today to "take it out."  His A1c is high.  His BP is high.  And now his kidneys are misbehaving.  He denies prior h/o with renal dysfunction.  He has known DM and takes medication for this; misses rarely.  He takes BP medication and does not report missing doses of this either.  Last week, he had n/v and low-grade fever and recurrent abdominal pain.  Symptoms resolved Monday, he thinks.  This happened right after his last procedure.  Last emesis was last week.  Some diarrhea.  Last loose stool was last night after he drank a gallon of water.    Review of Systems:  ROS As per HPI otherwise 10 point review of systems negative.    Past Medical History: Past Medical History:  Diagnosis Date  . AKI (acute kidney injury) ([place]) 02/2019  . Diabetes mellitus   . Hypercholesterolemia   .  Hypertension   . Pneumonia     Past Surgical History: Past Surgical History:  Procedure Laterality Date  . CHOLECYSTECTOMY N/A 01/22/2016   Procedure: LAPAROSCOPIC CHOLECYSTECTOMY;  Surgeon: Aviva Signs, MD;  Location: [facility] ORS;  Service: General;  Laterality: N/A;  . COLONOSCOPY  2007 SLF   1 CM SIMPLE ADENOMA  . COLONOSCOPY N/A 04/26/2015   Procedure: COLONOSCOPY;  Surgeon: Danie Binder, MD;  Location: [facility] ENDO SUITE;  Service: Endoscopy;  Laterality: N/A;  930   . VIDEO BRONCHOSCOPY WITH ENDOBRONCHIAL NAVIGATION N/A 02/22/2019   Procedure: VIDEO BRONCHOSCOPY WITH ENDOBRONCHIAL NAVIGATION WITH BIOPIES;  Surgeon: Lajuana Matte, MD;  Location: [facility] OR;  Service: Thoracic;  Laterality: N/A;     Allergies:  No Known Allergies   Social History:  reports that he has quit smoking. He has never used smokeless tobacco. He reports current alcohol use. He reports that he does not use drugs.   Family History: Family History  Problem Relation Age of Onset  . Cancer Paternal Grandfather   . Colon cancer Neg Hx       Physical Exam: Vitals:   03/02/19 0549 03/02/19 0555 03/02/19 1355 03/02/19 1400  BP: (!) 148/103 (!) 149/67 (!) 152/92   Pulse: 99  100   Resp: 20  18  Temp: 98.4 F (36.9 C)  98.4 F (36.9 C)   TempSrc: Oral  Oral   SpO2: 100%  99%   Weight: (!) 137 kg   134.9 kg  Height: _0  (1.778 m)   _1  (1.778 m)    Constitutional: Alert and awake, oriented x3, not in any acute distress. Eyes: PERLA, EOMI, irises appear normal, anicteric sclera,  ENMT: external ears and nose appear normal, normal hearing, Lips appear normal Neck: neck appears normal, no masses, normal ROM, no thyromegaly, no JVD  CVS: S1-S2 clear, no murmur rubs or gallops, no LE edema, normal pedal pulses  Respiratory:  clear to auscultation bilaterally, no wheezing, rales or rhonchi. Respiratory effort normal. No accessory muscle use.  Abdomen: soft nontender, nondistended, normal bowel  sounds, no hepatosplenomegaly, no hernias  Musculoskeletal: : no cyanosis, clubbing or edema noted bilaterally Neuro: Cranial nerves II-XII intact, strength, sensation, reflexes Psych: judgement and insight appear normal, stable mood and affect, mental status Skin: no rashes or lesions or ulcers, no induration or nodules    Data reviewed:  I have personally reviewed the recent labs and imaging studies  Pertinent Labs:   Na++ 132 Glucose 179 BUN 37/Creatinine 2.65/GFR 28; 27/1.69/41 on 9/4, which appears to be about baseline BNP 17.6 UA: 50 glucose   Inpatient Medications:   Scheduled Meds: . aspirin EC  81 mg Oral Daily  . enoxaparin (LOVENOX) injection  30 mg Subcutaneous Q24H  . insulin aspart  0-24 Units Subcutaneous Q4H  . living well with diabetes book   Does not apply Once  . pravastatin  40 mg Oral q1800   Continuous Infusions: . lactated ringers       Radiological Exams on Admission: Dg Chest 2 View  Result Date: 03/02/2019 CLINICAL DATA:  Preoperative assessment for VATS procedure EXAM: CHEST - 2 VIEW COMPARISON:  Chest radiograph February 22, 2019 and PET-CT January 16, 2019 FINDINGS: No evident edema or consolidation. The area of increased opacity and abnormal radiotracer uptake in the right inferior perihilar region seen on PET study is not evident by radiography. Heart size and pulmonary vascular normal. No adenopathy. No bone lesions. IMPRESSION: No edema or consolidation. No pulmonary lesion is demonstrable by radiography currently. No adenopathy evident. Electronically Signed   By: Lowella Grip III M.D.   On: 03/02/2019 09:03    Impression/Recommendations Active Problems:   HTN (hypertension)   Diabetes (HCC)   Right lower lobe lung mass   Acute kidney injury (AKI) with acute tubular necrosis (ATN) (HCC)   Dyslipidemia   AKI  -Patient with long-standing HTN and DM with suboptimal control presenting for bronch and found to have AKI -He reported n/v/d  over last weeknd -Suspect volume depletion with prerenal azotemia resulting from excessive volume loss -Will give MIVF at 100cc/hr and recheck BMP in AM -Would avoid nephrotoxic medications including lisinopril and metformin for now -Will order renal US  HTN -Hold Lisinopril -Will add Norvasc for maintenance control -Will add prn IV hydralazine for now  DM -Will check A1c -hold Glucophage -Cover with moderate-scale SSI -Consult to diabetes coordinator  RLL mass -BAL on 9/9 with atypical cells concerning for malignancy -Bronch with lung biopsy that was non-diagnostic -Video bronch with thoracoscopy/RLL lobectomy was planned for today due to difficult location of the nodule -This was held due to AKI -May be able to proceed in AM so will make NPO after midnight in case  HLD -Continue Pravachol    Thank you for this consultation.  Our [facility] hospitalist team will follow the patient with you.   Time Spent: 56 minutes  Karmen Bongo M.D. [facility] 03/02/2019, 4:59 PM

## 2019-03-02 NOTE — H&P (Signed)
[address]       [place] [address]             [phone]                    Kailin W Owczarzak [facility] Medical Record #[id number] Date of Birth: 1951/01/25  Referring: No ref. provider found Primary Care: Lemmie Evens, MD Primary Cardiologist: No primary care provider on file.  Chief Complaint:   Elevated creatinine  History of Present Illness:    Gregory Lindsey 68 y.o. male who is well-known to the service for a right lower lobe pulmonary nodule.  He was scheduled to undergo right VATS right lower lobectomy today but was noted to have an elevated creatinine on yesterday's preoperative labs.  The patient was called yesterday evening and informed to hydrate and that we will recheck his labs but today there was another elevation to 2.6 and his creatinine.  Due to concern for ongoing acute injury his surgery has been postponed and he will undergo medical optimization.  He does admit that he has stopped taking his medication at home and has reported some systolic blood pressures in the 170s as well as blood sugars in the 200s and 300s.        Past Medical History:  Diagnosis Date  . Diabetes mellitus   . Hypercholesterolemia   . Hypertension   . Pneumonia     Past Surgical History:  Procedure Laterality Date  . CHOLECYSTECTOMY N/A 01/22/2016   Procedure: LAPAROSCOPIC CHOLECYSTECTOMY;  Surgeon: Aviva Signs, MD;  Location: [facility] ORS;  Service: General;  Laterality: N/A;  . COLONOSCOPY  2007 SL[facility]   1 CM SIMPLE ADENOMA  . COLONOSCOPY N/A 04/26/2015   Procedure: COLONOSCOPY;  Surgeon: Danie Binder, MD;  Location: [facility] ENDO SUITE;  Service: Endoscopy;  Laterality: N/A;  930   . VIDEO BRONCHOSCOPY WITH ENDOBRONCHIAL NAVIGATION N/A 02/22/2019   Procedure: VIDEO BRONCHOSCOPY WITH ENDOBRONCHIAL NAVIGATION WITH BIOPIES;  Surgeon: Lajuana Matte, MD;  Location: [facility] OR;  Service: Thoracic;  Laterality: N/A;    Family History  Problem Relation Age of Onset   . Cancer Paternal Grandfather   . Colon cancer Neg Hx      Social History   Tobacco Use  Smoking Status Never Smoker  Smokeless Tobacco Never Used    Social History   Substance and Sexual Activity  Alcohol Use Yes  . Alcohol/week: 0.0 standard drinks   Comment: beer 2-3 times per week, 2 shots of scotch on saturday night      No Known Allergies  Current Facility-Administered Medications  Medication Dose Route Frequency Provider Last Rate Last Dose  . aspirin EC tablet 81 mg  81 mg Oral Daily Ezzie Senat O, MD      . bupivacaine liposome (EXPAREL) 1.3 % injection 266 mg  20 mL Other To OR [place] Pelto O, MD      . ceFAZolin (ANCEF) 3 g in dextrose 5 % 50 mL IVPB  3 g Intravenous To SS-Surg Tyrice Hewitt O, MD      . enoxaparin (LOVENOX) injection 30 mg  30 mg Subcutaneous Q24H Teven Mittman O, MD      . hydrALAZINE (APRESOLINE) injection 10 mg  10 mg Intravenous Q6H PRN Jerzee Jerome O, MD      . insulin aspart (novoLOG) injection 0-24 Units  0-24 Units Subcutaneous Q4H Shereese Bonnie O, MD      . pravastatin (PRAVACHOL)  tablet 40 mg  40 mg Oral q1800 Hamzeh Tall, Lucile Crater, MD        Review of Systems  Constitutional: Negative.   HENT: Negative.   Respiratory: Negative.   Cardiovascular: Negative.   Gastrointestinal: Negative.   Musculoskeletal: Positive for back pain, joint pain and myalgias.  Neurological: Negative.      PHYSICAL EXAMINATION: BP (!) 149/67   Pulse 99   Temp 98.4 F (36.9 C) (Oral)   Resp 20   Ht 5\' 10"  (1.778 m)   Wt (!) 137 kg   SpO2 100%   BMI 43.33 kg/m  Physical Exam  Constitutional: He is oriented to person, place, and time. He appears well-developed and well-nourished. No distress.  HENT:  Head: Normocephalic and atraumatic.  Mouth/Throat: No oropharyngeal exudate.  Eyes: Conjunctivae and EOM are normal.  Neck: Normal range of motion. No tracheal deviation present.  Cardiovascular: Normal rate and  regular rhythm.  No murmur heard. Respiratory: Effort normal and breath sounds normal. No respiratory distress.  GI: Soft. He exhibits no distension.  Musculoskeletal:        General: No edema.  Neurological: He is alert and oriented to person, place, and time.  Skin: Skin is warm and dry. He is not diaphoretic.    Diagnostic Studies & Laboratory data:     Recent Radiology Findings:   Dg Chest 2 View  Result Date: 02/22/2019 CLINICAL DATA:  Right lower lobe nodule EXAM: CHEST - 2 VIEW COMPARISON:  Chest CT 12/01/2018 FINDINGS: Normal heart size and mediastinal contours. No acute infiltrate or edema. No effusion or pneumothorax. No acute osseous findings. IMPRESSION: No evidence of active disease. Electronically Signed   By: Monte Fantasia M.D.   On: 02/22/2019 07:36   Dg C-arm Bronchoscopy  Result Date: 02/22/2019 C-ARM BRONCHOSCOPY: Fluoroscopy was utilized by the requesting physician.  No radiographic interpretation.       I have independently reviewed the above radiology studies  and reviewed the findings with the patient.   Recent Lab Findings: Lab Results  Component Value Date   WBC 7.9 03/01/2019   HGB 10.8 (L) 03/01/2019   HCT 33.5 (L) 03/01/2019   PLT 211 03/01/2019   GLUCOSE 179 (H) 03/02/2019   ALT 15 03/01/2019   AST 16 03/01/2019   NA 132 (L) 03/02/2019   K 3.6 03/02/2019   CL 97 (L) 03/02/2019   CREATININE 2.65 (H) 03/02/2019   BUN 37 (H) 03/02/2019   CO2 22 03/02/2019   INR 1.0 03/01/2019   HGBA1C 9.8 (H) 02/22/2019      Assessment / Plan:   68 year old male with a 2.4 cm right lower lobe pulmonary nodule that is avid on PET scan concerning for primary lung cancer.  He was scheduled to undergo surgery for this problem but has an acute kidney injury.  To be admitted for medical optimization and blood pressure titration.    Lajuana Matte 03/02/2019 8:48 AM

## 2019-03-02 NOTE — Progress Notes (Signed)
Inpatient Diabetes Program Recommendations  AACE/ADA: New Consensus Statement on Inpatient Glycemic Control (2015)  Target Ranges:  Prepandial:   less than 140 mg/dL      Peak postprandial:   less than 180 mg/dL (1-2 hours)      Critically ill patients:  140 - 180 mg/dL   Lab Results  Component Value Date   GLUCAP 171 (H) 03/02/2019   HGBA1C 9.8 (H) 02/22/2019    Review of Glycemic Control Results for Gregory Lindsey, Gregory Lindsey (MRN [id number]) as of 03/02/2019 09:56  Ref. Range 02/22/2019 10:52 03/01/2019 14:57 03/02/2019 05:57  Glucose-Capillary Latest Ref Range: 70 - 99 mg/dL 223 (H) 184 (H) 171 (H)   Diabetes history: Type 2 DM Outpatient Diabetes medications: Metformin 1000 mg BID Current orders for Inpatient glycemic control: Novolog 0-24 units Q4H  Inpatient Diabetes Program Recommendations:    Noted consult. Will follow trends while inpatient.  Of note, given GFR <45 would NOT recommend resuming Metformin at discharge.   Will plan to speak with patient.   Thanks, Bronson Curb, MSN, RNC-OB Diabetes Coordinator [phone] (8a-5p)

## 2019-03-02 NOTE — Progress Notes (Signed)
Pt arrived from PACU. Pt c/a/ox4. Pt denies complaints. CHG bath given. Telebox 13 applied/ccmd notified. Pt wife has all belongings. Vitals stable. Pt oriented to room and call bell. Will continue to monitor.  Jerald Kief, RN

## 2019-03-03 LAB — BASIC METABOLIC PANEL
Anion gap: 9 (ref 5–15)
BUN: 28 mg/dL — ABNORMAL HIGH (ref 8–23)
CO2: 22 mmol/L (ref 22–32)
Calcium: 8.8 mg/dL — ABNORMAL LOW (ref 8.9–10.3)
Chloride: 105 mmol/L (ref 98–111)
Creatinine, Ser: 1.97 mg/dL — ABNORMAL HIGH (ref 0.61–1.24)
GFR calc Af Amer: 40 mL/min — ABNORMAL LOW (ref >60)
GFR calc non Af Amer: 34 mL/min — ABNORMAL LOW (ref >60)
Glucose, Bld: 125 mg/dL — ABNORMAL HIGH (ref 70–99)
Potassium: 4.1 mmol/L (ref 3.5–5.1)
Sodium: 136 mmol/L (ref 135–145)

## 2019-03-03 LAB — CBC
HCT: 31.6 % — ABNORMAL LOW (ref 39.0–52.0)
Hemoglobin: 10 g/dL — ABNORMAL LOW (ref 13.0–17.0)
MCH: 28.2 pg (ref 26.0–34.0)
MCHC: 31.6 g/dL (ref 30.0–36.0)
MCV: 89.3 fL (ref 80.0–100.0)
Platelets: 202 10*3/uL (ref 150–400)
RBC: 3.54 MIL/uL — ABNORMAL LOW (ref 4.22–5.81)
RDW: 12.6 % (ref 11.5–15.5)
WBC: 8.3 10*3/uL (ref 4.0–10.5)
nRBC: 0 % (ref 0.0–0.2)

## 2019-03-03 LAB — GLUCOSE, CAPILLARY
Glucose-Capillary: 116 mg/dL — ABNORMAL HIGH (ref 70–99)
Glucose-Capillary: 156 mg/dL — ABNORMAL HIGH (ref 70–99)
Glucose-Capillary: 183 mg/dL — ABNORMAL HIGH (ref 70–99)
Glucose-Capillary: 223 mg/dL — ABNORMAL HIGH (ref 70–99)

## 2019-03-03 NOTE — Progress Notes (Signed)
Alert, oriented x 4. Pt went to renal ultrasound around 11 pm. He has been NPO after midnight, preparing for surgical procedure.  BUN 28, Cr 1.97 , blood drown 02:30 am. He has been hydrated all night with Lactate Ringer 100 ml/hr. Vital remains stable, no acute distress noted. Continue to monitor.  Kennyth Lose, RN

## 2019-03-03 NOTE — Plan of Care (Signed)
  RD consulted for nutrition education regarding diabetes.   Lab Results  Component Value Date   HGBA1C 9.8 (H) 02/22/2019  PTA DM medications are 1000 mg metformin BID.   Labs reviewed: CBGS: 116-141 (inpatient orders for glycemic control are 0-24 insulin aspart every 4 hours).   Spoke with pt at bedside, who is pleasant and in good spirits at time of visit. Pt reports he has a good appetite currently and PTA. Pt reports he has been trying to choose healthier options PTA- Breakfast consists usually of grits or oatmeal, eggs, sausage, and bacon; Lunch: takeout food (double cheeseburger) from [employer]; Dinner: baked chicken or fish, starch, and vegetable. Beverages are usually diet soda and unsweetened tea.   Pt reports that pt he was diagnosed with DM around 8-10 years ago and has always struggled with control. He reports CBGS are usually over 200's in the morning but trend down during the day. He is interested in learning more about lifestyle changes to help manage DM, especially with possible cancer diagnosis. He has never received outpatient DM education, but is considering it.   Focus of education on was on DM self-management and ways to reduce/ improve carbohydrate intake in diet.   RD provided "Carbohydrate Counting for People with Diabetes" handout from the Academy of Nutrition and Dietetics. Discussed different food groups and their effects on blood sugar, emphasizing carbohydrate-containing foods. Provided list of carbohydrates and recommended serving sizes of common foods.  Discussed importance of controlled and consistent carbohydrate intake throughout the day. Provided examples of ways to balance meals/snacks and encouraged intake of high-fiber, whole grain complex carbohydrates. Teach back method used.  Expect fair to good compliance.  Body mass index is 42.67 kg/m. Pt meets criteria for extreme obesity, class III based on current BMI.  Current diet order is heart healthy/  carb modified, patient is consuming approximately 100% of meals at this time. Labs and medications reviewed. No further nutrition interventions warranted at this time. RD contact information provided. If additional nutrition issues arise, please re-consult RD.  Amena Dockham A. Jimmye Norman, RD, LDN, [place] Registered Dietitian II Certified Diabetes Care and Education Specialist Pager: [phone] After hours Pager: [phone]

## 2019-03-03 NOTE — Progress Notes (Addendum)
      [address]       [place] [address]             [phone]       1 Day Post-Op Procedure(s) (LRB): VIDEO BRONCHOSCOPY (N/A) VIDEO ASSISTED THORACOSCOPY (VATS)/RIGHT LOWER LOBECTOMY (Right)  Subjective: Patient without complaints this am.  Objective: Vital signs in last 24 hours: Temp:  [98 F (36.7 C)-98.5 F (36.9 C)] 98 F (36.7 C) (09/18 0359) Pulse Rate:  [81-100] 81 (09/18 0359) Cardiac Rhythm: Normal sinus rhythm (09/18 0359) Resp:  [13-18] 13 (09/18 0359) BP: (128-152)/(65-92) 128/75 (09/18 0359) SpO2:  [98 %-99 %] 98 % (09/18 0359) Weight:  [134.9 kg] 134.9 kg (09/17 1400)     Intake/Output from previous day: 09/17 0701 - 09/18 0700 In: 1439.4 [P.O.:490; I.V.:949.4] Out: 900 [Urine:900]   Physical Exam:  Cardiovascular: RRR Pulmonary: Clear to auscultation bilaterally Abdomen: Soft, non tender, bowel sounds present. Extremities: No lower extremity edema. .   Lab Results: CBC: Recent Labs    03/01/19 1540 03/03/19 0230  WBC 7.9 8.3  HGB 10.8* 10.0*  HCT 33.5* 31.6*  PLT 211 202   BMET:  Recent Labs    03/02/19 0554 03/03/19 0230  NA 132* 136  K 3.6 4.1  CL 97* 105  CO2 22 22  GLUCOSE 179* 125*  BUN 37* 28*  CREATININE 2.65* 1.97*  CALCIUM 8.8* 8.8*    PT/INR:  Recent Labs    03/01/19 1540  LABPROT 12.7  INR 1.0   ABG:  INR: Will add last result for INR, ABG once components are confirmed Will add last 4 CBG results once components are confirmed  Assessment/Plan:  1. CV - SR 70-80's 2.  Pulmonary - On room air.  3. AKI-Creatinine decreased from 2.65 to 1.97. On IVF at 100 ml/hr. Renal US showed NO hydronephrosis, enlarged prostate with mass effect on the posterior bladder. 4. Anemia-H and H this am 10 and 31.6  Donielle M ZimmermanPA-C 03/03/2019,7:08 AM [phone]  Agree with above Creat and BP improved Will plan for surgery on Tuesday Maurizio Geno O Mckoy Bhakta

## 2019-03-03 NOTE — Progress Notes (Signed)
PROGRESS NOTE    Gregory Lindsey  RKY:[id number] DOB: 1951-03-15 DOA: 03/02/2019 PCP: Lemmie Evens, MD    Brief Narrative:   68 year old gentleman with history of hypertension, hyperlipidemia and diabetes who was recently found to have right lower lobe pulmonary nodule, underwent bronchoscopy and biopsy that was nondiagnostic.  He was brought in for elective lobectomy, found to have elevated BUN and creatinine hence needed admission and treatment before undergoing surgical procedure.  Medicine is consulted for comanagement.  Assessment & Plan:   Active Problems:   HTN (hypertension)   Diabetes ([place])   Right lower lobe lung mass   Acute kidney injury (AKI) with acute tubular necrosis (ATN) (HCC)   Dyslipidemia  Acute kidney injury with history of stage III chronic kidney disease: His baseline creatinine is about 1.7.  Patient does have history of poor appetite and nausea vomiting lasted for about 1 week.  He was continuing on his lisinopril and metformin.  Patient treated with IV fluids overnight and improvement of levels.  Continue maintenance IV fluids.  We will recheck levels in the morning to ensure stabilization.  Type 2 diabetes: Patient is on metformin at home.  With AKI and anticipated procedures, will discontinue metformin and keep on sliding scale insulin.  Hypertension: Discontinue lisinopril.  Will not resume until postop.  Amlodipine was added.  Blood pressures are fairly stable.  Right lower lobe lung mass: Scheduled for bronc, lobectomy.  As per surgery.  Thank you for involving [place] in this patient's care.  I will continue to follow along with you during his hospital stay.  DVT prophylaxis: Lovenox subcu Code Status: Full code Family Communication: None Disposition Plan: Pending    Procedures:   None  Antimicrobials:   None   Subjective: Patient seen and examined.  No overnight events.  He denies any cough congestion or chest pain.  Patient does endorse  poor appetite for about a week that has improved now.  Objective: Vitals:   03/02/19 2106 03/02/19 2341 03/03/19 0359 03/03/19 1137  BP: 136/65 (!) 141/72 128/75 114/61  Pulse: 87 89 81 89  Resp: 15 17 13 18   Temp: 98.2 F (36.8 C) 98.5 F (36.9 C) 98 F (36.7 C)   TempSrc: Oral Oral Oral   SpO2: 98% 98% 98% 100%  Weight:      Height:        Intake/Output Summary (Last 24 hours) at 03/03/2019 1403 Last data filed at 03/03/2019 0554 Gross per 24 hour  Intake 1439.35 ml  Output 900 ml  Net 539.35 ml   Filed Weights   03/02/19 0549 03/02/19 1400  Weight: (!) 137 kg 134.9 kg    Examination:  General exam: Appears calm and comfortable, on room air Respiratory system: Clear to auscultation. Respiratory effort normal. Cardiovascular system: S1 & S2 heard, RRR. No JVD, murmurs, rubs, gallops or clicks. No pedal edema. Gastrointestinal system: Abdomen is nondistended, soft and nontender. No organomegaly or masses felt. Normal bowel sounds heard. Central nervous system: Alert and oriented. No focal neurological deficits. Extremities: Symmetric 5 x 5 power. Skin: No rashes, lesions or ulcers Psychiatry: Judgement and insight appear normal. Mood & affect appropriate.     Data Reviewed: I have personally reviewed following labs and imaging studies  CBC: Recent Labs  Lab 03/01/19 1540 03/03/19 0230  WBC 7.9 8.3  HGB 10.8* 10.0*  HCT 33.5* 31.6*  MCV 89.1 89.3  PLT 211 315   Basic Metabolic Panel: Recent Labs  Lab 03/01/19 1540 03/02/19  0554 03/03/19 0230  NA 134* 132* 136  K 4.1 3.6 4.1  CL 101 97* 105  CO2 16* 22 22  GLUCOSE 221* 179* 125*  BUN 35* 37* 28*  CREATININE 2.41* 2.65* 1.97*  CALCIUM 9.3 8.8* 8.8*   GFR: Estimated Creatinine Clearance: 51 mL/min (A) (by C-G formula based on SCr of 1.97 mg/dL (H)). Liver Function Tests: Recent Labs  Lab 03/01/19 1540  AST 16  ALT 15  ALKPHOS 66  BILITOT 0.7  PROT 7.4  ALBUMIN 4.0   No results for  input(s): LIPASE, AMYLASE in the last 168 hours. No results for input(s): AMMONIA in the last 168 hours. Coagulation Profile: Recent Labs  Lab 03/01/19 1540  INR 1.0   Cardiac Enzymes: No results for input(s): CKTOTAL, CKMB, CKMBINDEX, TROPONINI in the last 168 hours. BNP (last 3 results) No results for input(s): PROBNP in the last 8760 hours. HbA1C: No results for input(s): HGBA1C in the last 72 hours. CBG: Recent Labs  Lab 03/02/19 1644 03/02/19 2137 03/02/19 2346 03/03/19 0357 03/03/19 0838  GLUCAP 217* 143* 141* 116* 156*   Lipid Profile: No results for input(s): CHOL, HDL, LDLCALC, TRIG, CHOLHDL, LDLDIRECT in the last 72 hours. Thyroid Function Tests: No results for input(s): TSH, T4TOTAL, FREET4, T3FREE, THYROIDAB in the last 72 hours. Anemia Panel: No results for input(s): VITAMINB12, FOLATE, FERRITIN, TIBC, IRON, RETICCTPCT in the last 72 hours. Sepsis Labs: No results for input(s): PROCALCITON, LATICACIDVEN in the last 168 hours.  Recent Results (from the past 240 hour(s))  SARS CORONAVIRUS 2 (TAT 6-24 HRS) Nasopharyngeal Nasopharyngeal Swab     Status: None   Collection Time: 02/28/19  7:14 AM   Specimen: Nasopharyngeal Swab  Result Value Ref Range Status   SARS Coronavirus 2 NEGATIVE NEGATIVE Final    Comment: (NOTE) SARS-CoV-2 target nucleic acids are NOT DETECTED. The SARS-CoV-2 RNA is generally detectable in upper and lower respiratory specimens during the acute phase of infection. Negative results do not preclude SARS-CoV-2 infection, do not rule out co-infections with other pathogens, and should not be used as the sole basis for treatment or other patient management decisions. Negative results must be combined with clinical observations, patient history, and epidemiological information. The expected result is Negative. Fact Sheet for Patients: SugarRoll.be Fact Sheet for Healthcare Providers:  https://www.woods-mathews.com/ This test is not yet approved or cleared by the [place] FDA and  has been authorized for detection and/or diagnosis of SARS-CoV-2 by FDA under an Emergency Use Authorization (EUA). This EUA will remain  in effect (meaning this test can be used) for the duration of the COVID-19 declaration under Section 56 4(b)(1) of the Act, 21 U.S.C. section 360bbb-3(b)(1), unless the authorization is terminated or revoked sooner. Performed at [facility], [place] [address] [place],  [address]          Radiology Studies: Dg Chest 2 View  Result Date: 03/02/2019 CLINICAL DATA:  Preoperative assessment for VATS procedure EXAM: CHEST - 2 VIEW COMPARISON:  Chest radiograph February 22, 2019 and PET-CT January 16, 2019 FINDINGS: No evident edema or consolidation. The area of increased opacity and abnormal radiotracer uptake in the right inferior perihilar region seen on PET study is not evident by radiography. Heart size and pulmonary vascular normal. No adenopathy. No bone lesions. IMPRESSION: No edema or consolidation. No pulmonary lesion is demonstrable by radiography currently. No adenopathy evident. Electronically Signed   By: Lowella Grip III M.D.   On: 03/02/2019 09:03   US Renal  Result Date: 03/02/2019 CLINICAL DATA:  Acute renal failure EXAM: RENAL / URINARY TRACT ULTRASOUND COMPLETE COMPARISON:  CT 12/01/2018 FINDINGS: Right Kidney: Renal measurements: 10.8 by 4.4 x 4.8 cm = volume: 119.3 mL . Echogenicity within normal limits. No mass or hydronephrosis visualized. Left Kidney: Renal measurements: 10.2 x 5.2 x 5.7 cm = volume: 156.4 mL. Echogenicity within normal limits. No mass or hydronephrosis visualized. Bladder: Appears normal for degree of bladder distention. Enlarged prostate gland with mass effect on the posterior bladder. IMPRESSION: 1. Negative for hydronephrosis. Cortical echogenicity within normal limits 2. Enlarged prostate  with mass effect on the bladder Electronically Signed   By: Donavan Foil M.D.   On: 03/02/2019 23:33        Scheduled Meds: . amLODipine  5 mg Oral Daily  . aspirin EC  81 mg Oral Daily  . enoxaparin (LOVENOX) injection  30 mg Subcutaneous Q24H  . insulin aspart  0-24 Units Subcutaneous Q4H  . living well with diabetes book   Does not apply Once  . pravastatin  40 mg Oral q1800   Continuous Infusions: . lactated ringers 100 mL/hr at 03/03/19 0359     LOS: 1 day    Time spent: 25 minutes     Barb Merino, MD [facility] Pager [phone]  If 7PM-7AM, please contact night-coverage [email or web address] Password [facility] 03/03/2019, 2:03 PM

## 2019-03-04 LAB — GLUCOSE, CAPILLARY
Glucose-Capillary: 114 mg/dL — ABNORMAL HIGH (ref 70–99)
Glucose-Capillary: 131 mg/dL — ABNORMAL HIGH (ref 70–99)
Glucose-Capillary: 137 mg/dL — ABNORMAL HIGH (ref 70–99)
Glucose-Capillary: 144 mg/dL — ABNORMAL HIGH (ref 70–99)
Glucose-Capillary: 183 mg/dL — ABNORMAL HIGH (ref 70–99)
Glucose-Capillary: 269 mg/dL — ABNORMAL HIGH (ref 70–99)

## 2019-03-04 LAB — BASIC METABOLIC PANEL
Anion gap: 11 (ref 5–15)
BUN: 23 mg/dL (ref 8–23)
CO2: 22 mmol/L (ref 22–32)
Calcium: 8.9 mg/dL (ref 8.9–10.3)
Chloride: 104 mmol/L (ref 98–111)
Creatinine, Ser: 1.77 mg/dL — ABNORMAL HIGH (ref 0.61–1.24)
GFR calc Af Amer: 45 mL/min — ABNORMAL LOW (ref >60)
GFR calc non Af Amer: 39 mL/min — ABNORMAL LOW (ref >60)
Glucose, Bld: 121 mg/dL — ABNORMAL HIGH (ref 70–99)
Potassium: 4 mmol/L (ref 3.5–5.1)
Sodium: 137 mmol/L (ref 135–145)

## 2019-03-04 MED ORDER — INSULIN ASPART 100 UNIT/ML ~~LOC~~ SOLN
0.0000 [IU] | Freq: Three times a day (TID) | SUBCUTANEOUS | Status: DC
  Administered 2019-03-05 (×2): 2 [IU] via SUBCUTANEOUS
  Administered 2019-03-05: 13:00:00 4 [IU] via SUBCUTANEOUS
  Administered 2019-03-06: 12:00:00 8 [IU] via SUBCUTANEOUS
  Administered 2019-03-06 (×2): 4 [IU] via SUBCUTANEOUS

## 2019-03-04 MED ORDER — DOCUSATE SODIUM 100 MG PO CAPS
100.0000 mg | ORAL_CAPSULE | Freq: Every day | ORAL | Status: DC
  Administered 2019-03-04 – 2019-03-09 (×5): 100 mg via ORAL
  Filled 2019-03-04 (×5): qty 1

## 2019-03-04 MED ORDER — POLYETHYLENE GLYCOL 3350 17 G PO PACK
17.0000 g | PACK | Freq: Once | ORAL | Status: AC
  Administered 2019-03-04: 17 g via ORAL
  Filled 2019-03-04: qty 1

## 2019-03-04 NOTE — Progress Notes (Signed)
      [address]       [place] [address]             [phone]      2 Days Post-Op Procedure(s) (LRB): VIDEO BRONCHOSCOPY (N/A) VIDEO ASSISTED THORACOSCOPY (VATS)/RIGHT LOWER LOBECTOMY (Right) Subjective: Feels good and wanting surgery asap.  Objective: Vital signs in last 24 hours: Temp:  [98.1 F (36.7 C)-98.2 F (36.8 C)] 98.1 F (36.7 C) (09/19 0754) Pulse Rate:  [82-89] 85 (09/19 0754) Cardiac Rhythm: Sinus tachycardia (09/18 1900) Resp:  [16-20] 17 (09/19 0754) BP: (114-133)/(61-77) 130/69 (09/19 0754) SpO2:  [97 %-100 %] 97 % (09/19 0754)     Intake/Output from previous day: 09/18 0701 - 09/19 0700 In: 2710.8 [P.O.:400; I.V.:2310.8] Out: 1225 [Urine:1225] Intake/Output this shift: No intake/output data recorded.  General appearance: alert, cooperative and no distress Heart:sinus tachycardia Lungs: clear to auscultation bilaterally Abdomen: soft, non-tender; bowel sounds normal; no masses,  no organomegaly Extremities: extremities normal, atraumatic, no cyanosis or edema Wound: N/A  Lab Results: Recent Labs    03/01/19 1540 03/03/19 0230  WBC 7.9 8.3  HGB 10.8* 10.0*  HCT 33.5* 31.6*  PLT 211 202   BMET:  Recent Labs    03/03/19 0230 03/04/19 0333  NA 136 137  K 4.1 4.0  CL 105 104  CO2 22 22  GLUCOSE 125* 121*  BUN 28* 23  CREATININE 1.97* 1.77*  CALCIUM 8.8* 8.9    PT/INR:  Recent Labs    03/01/19 1540  LABPROT 12.7  INR 1.0   ABG    Component Value Date/Time   PHART 7.396 03/01/2019 1514   HCO3 21.2 03/01/2019 1514   ACIDBASEDEF 2.9 (H) 03/01/2019 1514   O2SAT 97.8 03/01/2019 1514   CBG (last 3)  Recent Labs    03/03/19 2140 03/04/19 0027 03/04/19 0630  GLUCAP 137* 144* 131*    Assessment/Plan:  1. CV-NSR in the 80s. Remains hemodynamically stable. 2. Pulmonary-Tolerating room air with good oxygen saturation 3. Renal- AKI, creatinine 1.77 which is decreasing. On IVF.Renal US showed NO  hydronephrosis, enlarged prostate with mass effect on the posterior bladder.   4. Anemia-stable 10.0/31.6  Plan: Surgery planned for Tuesday with Dr. Kipp Brood.    LOS: 2 days    Elgie Collard 03/04/2019

## 2019-03-04 NOTE — Progress Notes (Signed)
PROGRESS NOTE    Gregory Lindsey  SWF:[id number] DOB: [birth year] DOA: 03/02/2019 PCP: Lemmie Evens, MD    Brief Narrative:   68 year old gentleman with history of hypertension, hyperlipidemia and diabetes who was recently found to have right lower lobe pulmonary nodule, underwent bronchoscopy and biopsy that was nondiagnostic.  He was brought in for elective lobectomy, found to have elevated BUN and creatinine hence needed admission and treatment before undergoing surgical procedure.  Medicine is consulted for comanagement.  Assessment & Plan:   Active Problems:   HTN (hypertension)   Diabetes ([place])   Right lower lobe lung mass   Acute kidney injury (AKI) with acute tubular necrosis (ATN) (HCC)   Dyslipidemia  Acute kidney injury with history of stage III chronic kidney disease: His baseline creatinine is about 1.7.  Patient did have history of poor appetite and nausea, vomiting lasted for about 1 week.  He was continuing on his lisinopril and metformin.  Patient treated with IV fluids with improvement.  We will decrease the rate of IV fluids to 50 mL/h.  Recheck Monday morning. Renal ultrasound without hydronephrosis and evidence of chronic renal disease. He does have enlarged prostate with some mass-effect on bladder, but he does not have clinical evidence of retention or prostatism.  Type 2 diabetes: Patient is on metformin at home.  With AKI and anticipated procedures, will discontinue metformin and keep on sliding scale insulin.  Hypertension: Discontinue lisinopril.  Will not resume until postop.  Amlodipine was added.  Blood pressures are fairly stable and acceptable.  Right lower lobe lung mass: Scheduled for bronc, lobectomy.  As per surgery.  Thank you for involving [place] in this patient's care.  I will continue to follow along with you during his hospital stay.  DVT prophylaxis: Lovenox subcu Code Status: Full code Family Communication: None Disposition Plan: Pending     Procedures:   None  Antimicrobials:   None   Subjective: Seen and examined.  No overnight events.  Denies any prostatic symptoms or retention.   Objective: Vitals:   03/03/19 2029 03/04/19 0016 03/04/19 0531 03/04/19 0754  BP: 127/77 131/70 133/66 130/69  Pulse: 82 83 85 85  Resp: 20 17 16 17   Temp: 98.2 F (36.8 C) 98.1 F (36.7 C) 98.1 F (36.7 C) 98.1 F (36.7 C)  TempSrc: Oral Oral Oral Oral  SpO2: 100% 99% 98% 97%  Weight:      Height:        Intake/Output Summary (Last 24 hours) at 03/04/2019 1139 Last data filed at 03/04/2019 0937 Gross per 24 hour  Intake 2710.76 ml  Output 1725 ml  Net 985.76 ml   Filed Weights   03/02/19 0549 03/02/19 1400  Weight: (!) 137 kg 134.9 kg    Examination:  General exam: Appears calm and comfortable, on room air Respiratory system: Clear to auscultation. Respiratory effort normal. Cardiovascular system: S1 & S2 heard, RRR. No JVD, murmurs, rubs, gallops or clicks. No pedal edema. Gastrointestinal system: Abdomen is nondistended, soft and nontender. No organomegaly or masses felt. Normal bowel sounds heard. Central nervous system: Alert and oriented. No focal neurological deficits. Extremities: Symmetric 5 x 5 power. Skin: No rashes, lesions or ulcers Psychiatry: Judgement and insight appear normal. Mood & affect appropriate.     Data Reviewed: I have personally reviewed following labs and imaging studies  CBC: Recent Labs  Lab 03/01/19 1540 03/03/19 0230  WBC 7.9 8.3  HGB 10.8* 10.0*  HCT 33.5* 31.6*  MCV 89.1 89.3  PLT 211 638   Basic Metabolic Panel: Recent Labs  Lab 03/01/19 1540 03/02/19 0554 03/03/19 0230 03/04/19 0333  NA 134* 132* 136 137  K 4.1 3.6 4.1 4.0  CL 101 97* 105 104  CO2 16* 22 22 22   GLUCOSE 221* 179* 125* 121*  BUN 35* 37* 28* 23  CREATININE 2.41* 2.65* 1.97* 1.77*  CALCIUM 9.3 8.8* 8.8* 8.9   GFR: Estimated Creatinine Clearance: 56.8 mL/min (A) (by C-G formula based on  SCr of 1.77 mg/dL (H)). Liver Function Tests: Recent Labs  Lab 03/01/19 1540  AST 16  ALT 15  ALKPHOS 66  BILITOT 0.7  PROT 7.4  ALBUMIN 4.0   No results for input(s): LIPASE, AMYLASE in the last 168 hours. No results for input(s): AMMONIA in the last 168 hours. Coagulation Profile: Recent Labs  Lab 03/01/19 1540  INR 1.0   Cardiac Enzymes: No results for input(s): CKTOTAL, CKMB, CKMBINDEX, TROPONINI in the last 168 hours. BNP (last 3 results) No results for input(s): PROBNP in the last 8760 hours. HbA1C: No results for input(s): HGBA1C in the last 72 hours. CBG: Recent Labs  Lab 03/03/19 1136 03/03/19 1737 03/03/19 2140 03/04/19 0027 03/04/19 0630  GLUCAP 183* 223* 137* 144* 131*   Lipid Profile: No results for input(s): CHOL, HDL, LDLCALC, TRIG, CHOLHDL, LDLDIRECT in the last 72 hours. Thyroid Function Tests: No results for input(s): TSH, T4TOTAL, FREET4, T3FREE, THYROIDAB in the last 72 hours. Anemia Panel: No results for input(s): VITAMINB12, FOLATE, FERRITIN, TIBC, IRON, RETICCTPCT in the last 72 hours. Sepsis Labs: No results for input(s): PROCALCITON, LATICACIDVEN in the last 168 hours.  Recent Results (from the past 240 hour(s))  SARS CORONAVIRUS 2 (TAT 6-24 HRS) Nasopharyngeal Nasopharyngeal Swab     Status: None   Collection Time: 02/28/19  7:14 AM   Specimen: Nasopharyngeal Swab  Result Value Ref Range Status   SARS Coronavirus 2 NEGATIVE NEGATIVE Final    Comment: (NOTE) SARS-CoV-2 target nucleic acids are NOT DETECTED. The SARS-CoV-2 RNA is generally detectable in upper and lower respiratory specimens during the acute phase of infection. Negative results do not preclude SARS-CoV-2 infection, do not rule out co-infections with other pathogens, and should not be used as the Lindsey basis for treatment or other patient management decisions. Negative results must be combined with clinical observations, patient history, and epidemiological information.  The expected result is Negative. Fact Sheet for Patients: SugarRoll.be Fact Sheet for Healthcare Providers: https://www.woods-mathews.com/ This test is not yet approved or cleared by the [place] FDA and  has been authorized for detection and/or diagnosis of SARS-CoV-2 by FDA under an Emergency Use Authorization (EUA). This EUA will remain  in effect (meaning this test can be used) for the duration of the COVID-19 declaration under Section 56 4(b)(1) of the Act, 21 U.S.C. section 360bbb-3(b)(1), unless the authorization is terminated or revoked sooner. Performed at [facility], [place] [address], [place], [place] [address]          Radiology Studies: US Renal  Result Date: 03/02/2019 CLINICAL DATA:  Acute renal failure EXAM: RENAL / URINARY TRACT ULTRASOUND COMPLETE COMPARISON:  CT 12/01/2018 FINDINGS: Right Kidney: Renal measurements: 10.8 by 4.4 x 4.8 cm = volume: 119.3 mL . Echogenicity within normal limits. No mass or hydronephrosis visualized. Left Kidney: Renal measurements: 10.2 x 5.2 x 5.7 cm = volume: 156.4 mL. Echogenicity within normal limits. No mass or hydronephrosis visualized. Bladder: Appears normal for degree of bladder distention. Enlarged prostate gland with mass  effect on the posterior bladder. IMPRESSION: 1. Negative for hydronephrosis. Cortical echogenicity within normal limits 2. Enlarged prostate with mass effect on the bladder Electronically Signed   By: Donavan Foil M.D.   On: 03/02/2019 23:33        Scheduled Meds: . amLODipine  5 mg Oral Daily  . aspirin EC  81 mg Oral Daily  . docusate sodium  100 mg Oral Daily  . enoxaparin (LOVENOX) injection  30 mg Subcutaneous Q24H  . insulin aspart  0-24 Units Subcutaneous Q4H  . living well with diabetes book   Does not apply Once  . pravastatin  40 mg Oral q1800   Continuous Infusions: . lactated ringers 50 mL/hr at 03/04/19 0934     LOS: 2 days     Time spent: 25 minutes     Barb Merino, MD [facility] Pager [phone]  If 7PM-7AM, please contact night-coverage [email or web address] Password [facility] 03/04/2019, 11:39 AM

## 2019-03-05 LAB — GLUCOSE, CAPILLARY
Glucose-Capillary: 156 mg/dL — ABNORMAL HIGH (ref 70–99)
Glucose-Capillary: 195 mg/dL — ABNORMAL HIGH (ref 70–99)
Glucose-Capillary: 175 mg/dL — ABNORMAL HIGH (ref 70–99)
Glucose-Capillary: 175 mg/dL — ABNORMAL HIGH (ref 70–99)

## 2019-03-05 MED ORDER — POLYETHYLENE GLYCOL 3350 17 G PO PACK
17.0000 g | PACK | Freq: Once | ORAL | Status: AC
  Administered 2019-03-05: 15:00:00 17 g via ORAL
  Filled 2019-03-05: qty 1

## 2019-03-05 NOTE — Progress Notes (Signed)
PROGRESS NOTE    Gregory Lindsey  NAT:[id number] DOB: 21-Oct-1950 DOA: 03/02/2019 PCP: Lemmie Evens, MD    Brief Narrative:   68 year old gentleman with history of hypertension, hyperlipidemia and diabetes who was recently found to have right lower lobe pulmonary nodule, underwent bronchoscopy and biopsy that was nondiagnostic.  He was brought in for elective lobectomy, found to have elevated BUN and creatinine hence needed admission and treatment before undergoing surgical procedure.  Medicine is consulted for co-management.  Assessment & Plan:   Active Problems:   HTN (hypertension)   Diabetes ([place])   Right lower lobe lung mass   Acute kidney injury (AKI) with acute tubular necrosis (ATN) (HCC)   Dyslipidemia  Acute kidney injury with history of stage III chronic kidney disease: His baseline creatinine is about 1.7.  Patient did have history of poor appetite and nausea, vomiting lasted for about 1 week.  He was continuing on his lisinopril and metformin.  Patient treated with IV fluids with improvement.  His creatinine is normalizing to he is on levels.  Discontinue IV fluids. Recheck in the morning to ensure stabilization. He does have enlarged prostate with some mass-effect on bladder, but he does not have clinical evidence of retention or prostatism.  Type 2 diabetes: Patient is on metformin at home.  With AKI and anticipated procedures, will discontinue metformin and keep on sliding scale insulin.  Hypertension: Discontinue lisinopril.  Will not resume until postop.  Amlodipine was added.  Blood pressures are fairly stable and acceptable.  Right lower lobe lung mass: Scheduled for bronc, lobectomy.  As per surgery.  Thank you for involving [place] in this patient's care.  I will continue to follow along with you during his hospital stay.  DVT prophylaxis: Lovenox subcu Code Status: Full code Family Communication: None Disposition Plan: Pending    Procedures:   None   Antimicrobials:   None   Subjective: Seen and examined.  No overnight events.  Denies any prostatic symptoms or retention. Received 1 dose of MiraLAX, is still no bowel movement.  He is passing gas.  Ambulating in the hallway.   Objective: Vitals:   03/05/19 0302 03/05/19 0808 03/05/19 1101 03/05/19 1256  BP: 132/72 (!) 147/75  131/65  Pulse: 77 81  81  Resp: 20 14 18 20   Temp: 98.3 F (36.8 C) 98 F (36.7 C)  98.5 F (36.9 C)  TempSrc: Oral Oral  Oral  SpO2: 98% 99%  98%  Weight:      Height:        Intake/Output Summary (Last 24 hours) at 03/05/2019 1342 Last data filed at 03/05/2019 0800 Gross per 24 hour  Intake 1993.88 ml  Output -  Net 1993.88 ml   Filed Weights   03/02/19 0549 03/02/19 1400  Weight: (!) 137 kg 134.9 kg    Examination:  General exam: Appears calm and comfortable, on room air Respiratory system: Clear to auscultation. Respiratory effort normal. Cardiovascular system: S1 & S2 heard, RRR. No JVD, murmurs, rubs, gallops or clicks. No pedal edema. Gastrointestinal system: Abdomen is nondistended, soft and nontender. No organomegaly or masses felt. Normal bowel sounds heard. Central nervous system: Alert and oriented. No focal neurological deficits. Extremities: Symmetric 5 x 5 power. Skin: No rashes, lesions or ulcers Psychiatry: Judgement and insight appear normal. Mood & affect appropriate.     Data Reviewed: I have personally reviewed following labs and imaging studies  CBC: Recent Labs  Lab 03/01/19 1540 03/03/19 0230  WBC 7.9 8.3  HGB 10.8* 10.0*  HCT 33.5* 31.6*  MCV 89.1 89.3  PLT 211 932   Basic Metabolic Panel: Recent Labs  Lab 03/01/19 1540 03/02/19 0554 03/03/19 0230 03/04/19 0333  NA 134* 132* 136 137  K 4.1 3.6 4.1 4.0  CL 101 97* 105 104  CO2 16* 22 22 22   GLUCOSE 221* 179* 125* 121*  BUN 35* 37* 28* 23  CREATININE 2.41* 2.65* 1.97* 1.77*  CALCIUM 9.3 8.8* 8.8* 8.9   GFR: Estimated Creatinine Clearance:  56.8 mL/min (A) (by C-G formula based on SCr of 1.77 mg/dL (H)). Liver Function Tests: Recent Labs  Lab 03/01/19 1540  AST 16  ALT 15  ALKPHOS 66  BILITOT 0.7  PROT 7.4  ALBUMIN 4.0   No results for input(s): LIPASE, AMYLASE in the last 168 hours. No results for input(s): AMMONIA in the last 168 hours. Coagulation Profile: Recent Labs  Lab 03/01/19 1540  INR 1.0   Cardiac Enzymes: No results for input(s): CKTOTAL, CKMB, CKMBINDEX, TROPONINI in the last 168 hours. BNP (last 3 results) No results for input(s): PROBNP in the last 8760 hours. HbA1C: No results for input(s): HGBA1C in the last 72 hours. CBG: Recent Labs  Lab 03/04/19 1208 03/04/19 1703 03/04/19 2211 03/05/19 0624 03/05/19 1222  GLUCAP 269* 114* 183* 156* 195*   Lipid Profile: No results for input(s): CHOL, HDL, LDLCALC, TRIG, CHOLHDL, LDLDIRECT in the last 72 hours. Thyroid Function Tests: No results for input(s): TSH, T4TOTAL, FREET4, T3FREE, THYROIDAB in the last 72 hours. Anemia Panel: No results for input(s): VITAMINB12, FOLATE, FERRITIN, TIBC, IRON, RETICCTPCT in the last 72 hours. Sepsis Labs: No results for input(s): PROCALCITON, LATICACIDVEN in the last 168 hours.  Recent Results (from the past 240 hour(s))  SARS CORONAVIRUS 2 (TAT 6-24 HRS) Nasopharyngeal Nasopharyngeal Swab     Status: None   Collection Time: 02/28/19  7:14 AM   Specimen: Nasopharyngeal Swab  Result Value Ref Range Status   SARS Coronavirus 2 NEGATIVE NEGATIVE Final    Comment: (NOTE) SARS-CoV-2 target nucleic acids are NOT DETECTED. The SARS-CoV-2 RNA is generally detectable in upper and lower respiratory specimens during the acute phase of infection. Negative results do not preclude SARS-CoV-2 infection, do not rule out co-infections with other pathogens, and should not be used as the sole basis for treatment or other patient management decisions. Negative results must be combined with clinical observations, patient  history, and epidemiological information. The expected result is Negative. Fact Sheet for Patients: SugarRoll.be Fact Sheet for Healthcare Providers: https://www.woods-mathews.com/ This test is not yet approved or cleared by the [place] FDA and  has been authorized for detection and/or diagnosis of SARS-CoV-2 by FDA under an Emergency Use Authorization (EUA). This EUA will remain  in effect (meaning this test can be used) for the duration of the COVID-19 declaration under Section 56 4(b)(1) of the Act, 21 U.S.C. section 360bbb-3(b)(1), unless the authorization is terminated or revoked sooner. Performed at [facility], [place] [address] [place], [place] [address]          Radiology Studies: No results found.      Scheduled Meds: . amLODipine  5 mg Oral Daily  . aspirin EC  81 mg Oral Daily  . docusate sodium  100 mg Oral Daily  . enoxaparin (LOVENOX) injection  30 mg Subcutaneous Q24H  . insulin aspart  0-24 Units Subcutaneous TID WC  . living well with diabetes book   Does not apply Once  . pravastatin  40 mg Oral q1800   Continuous Infusions: . lactated ringers 50 mL/hr at 03/05/19 0853     LOS: 3 days    Time spent: 15 minutes     Barb Merino, MD [facility] Pager [phone]  If 7PM-7AM, please contact night-coverage [email or web address] Password [email or web address] 03/05/2019, 1:42 PM

## 2019-03-06 LAB — BASIC METABOLIC PANEL
Anion gap: 11 (ref 5–15)
BUN: 15 mg/dL (ref 8–23)
CO2: 22 mmol/L (ref 22–32)
Calcium: 9.2 mg/dL (ref 8.9–10.3)
Chloride: 104 mmol/L (ref 98–111)
Creatinine, Ser: 1.63 mg/dL — ABNORMAL HIGH (ref 0.61–1.24)
GFR calc Af Amer: 50 mL/min — ABNORMAL LOW (ref >60)
GFR calc non Af Amer: 43 mL/min — ABNORMAL LOW (ref >60)
Glucose, Bld: 191 mg/dL — ABNORMAL HIGH (ref 70–99)
Potassium: 4.1 mmol/L (ref 3.5–5.1)
Sodium: 137 mmol/L (ref 135–145)

## 2019-03-06 LAB — GLUCOSE, CAPILLARY
Glucose-Capillary: 178 mg/dL — ABNORMAL HIGH (ref 70–99)
Glucose-Capillary: 204 mg/dL — ABNORMAL HIGH (ref 70–99)
Glucose-Capillary: 164 mg/dL — ABNORMAL HIGH (ref 70–99)
Glucose-Capillary: 269 mg/dL — ABNORMAL HIGH (ref 70–99)

## 2019-03-06 NOTE — Anesthesia Preprocedure Evaluation (Addendum)
Anesthesia Evaluation  Patient identified by MRN, date of birth, ID band Patient awake    Reviewed: Allergy & Precautions, NPO status , Patient's Chart, lab work & pertinent test results  Airway Mallampati: II  TM Distance: >3 FB Neck ROM: Full    Dental  (+) Dental Advisory Given, Edentulous Lower, Edentulous Upper   Pulmonary former smoker,  PULMONARY NODULE   Pulmonary exam normal breath sounds clear to auscultation       Cardiovascular hypertension, Pt. on medications Normal cardiovascular exam Rhythm:Regular Rate:Normal     Neuro/Psych negative neurological ROS  negative psych ROS   GI/Hepatic negative GI ROS, Neg liver ROS,   Endo/Other  diabetes, Type 2, Oral Hypoglycemic AgentsMorbid obesity  Renal/GU Renal InsufficiencyRenal disease     Musculoskeletal negative musculoskeletal ROS (+)   Abdominal   Peds  Hematology  (+) Blood dyscrasia, anemia ,   Anesthesia Other Findings Day of surgery medications reviewed with the patient.  Reproductive/Obstetrics                            Anesthesia Physical Anesthesia Plan  ASA: III  Anesthesia Plan: General   Post-op Pain Management:    Induction: Intravenous  PONV Risk Score and Plan: 3 and Midazolam, Ondansetron, Dexamethasone and Diphenhydramine  Airway Management Planned: Oral ETT and Double Lumen EBT  Additional Equipment: Arterial line, CVP and Ultrasound Guidance Line Placement  Intra-op Plan:   Post-operative Plan: Extubation in OR  Informed Consent: I have reviewed the patients History and Physical, chart, labs and discussed the procedure including the risks, benefits and alternatives for the proposed anesthesia with the patient or authorized representative who has indicated his/her understanding and acceptance.     Dental advisory given  Plan Discussed with: CRNA  Anesthesia Plan Comments:       Anesthesia  Quick Evaluation

## 2019-03-06 NOTE — Care Management Important Message (Signed)
Important Message  Patient Details  Name: Gregory Lindsey MRN: [id number] Date of Birth: [birth year]   Medicare Important Message Given:  Yes     Shelda Altes 03/06/2019, 12:19 PM

## 2019-03-06 NOTE — Progress Notes (Signed)
PROGRESS NOTE    Gregory Lindsey  KNL:[id number] DOB: 11/11/1950 DOA: 03/02/2019 PCP: Lemmie Evens, MD    Brief Narrative:   68 year old gentleman with history of hypertension, hyperlipidemia and diabetes who was recently found to have right lower lobe pulmonary nodule, underwent bronchoscopy and biopsy that was nondiagnostic.  He was brought in for elective lobectomy, found to have elevated BUN and creatinine hence needed admission and treatment before undergoing surgical procedure.  Medicine is consulted for co-management.  Assessment & Plan:   Active Problems:   HTN (hypertension)   Diabetes ([place])   Right lower lobe lung mass   Acute kidney injury (AKI) with acute tubular necrosis (ATN) (HCC)   Dyslipidemia  Acute kidney injury with history of stage III chronic kidney disease: His baseline creatinine is about 1.7.  Patient did have history of poor appetite and nausea, vomiting lasted for about 1 week.  He was continuing on his lisinopril and metformin.  Patient treated with IV fluids with improvement.  His creatinine is normalizing to he is on his chronic levels. He does have enlarged prostate with some mass-effect on bladder, but he does not have clinical evidence of retention or prostatism.  Type 2 diabetes: Patient is on metformin at home.  With AKI and anticipated procedures, will discontinue metformin and keep on sliding scale insulin.  Hypertension: Discontinue lisinopril.  Will not resume until postop.  Amlodipine was added.  Blood pressures are fairly stable and acceptable.  Right lower lobe lung mass: Scheduled for bronc, lobectomy tomorrow.  As per surgery.  Thank you for involving [place] in this patient's care.  I will continue to follow along with you during his hospital stay.  DVT prophylaxis: Lovenox subcu Code Status: Full code Family Communication: None Disposition Plan: Pending    Procedures:   None  Antimicrobials:   None   Subjective: Seen and  examined.  No overnight events.  Denies any prostatic symptoms or retention. No BM with miralax, but no symptoms. Looking forward for surgery.    Objective: Vitals:   03/06/19 0031 03/06/19 0407 03/06/19 0740 03/06/19 0743  BP: (!) 142/78 (!) 155/96  (!) 147/73  Pulse: 76 100  97  Resp: 17 14  (!) 22  Temp: 98.1 F (36.7 C) 98.2 F (36.8 C) 98.6 F (37 C) 98.6 F (37 C)  TempSrc: Oral Oral Oral Oral  SpO2: 99% 99%  97%  Weight:      Height:        Intake/Output Summary (Last 24 hours) at 03/06/2019 1021 Last data filed at 03/06/2019 0900 Gross per 24 hour  Intake 1335.78 ml  Output -  Net 1335.78 ml   Filed Weights   03/02/19 0549 03/02/19 1400  Weight: (!) 137 kg 134.9 kg    Examination:  General exam: Appears calm and comfortable, on room air Respiratory system: Clear to auscultation. Respiratory effort normal. Cardiovascular system: S1 & S2 heard, RRR. No JVD, murmurs, rubs, gallops or clicks. No pedal edema. Gastrointestinal system: Abdomen is nondistended, soft and nontender. No organomegaly or masses felt. Normal bowel sounds heard. Central nervous system: Alert and oriented. No focal neurological deficits. Extremities: Symmetric 5 x 5 power. Skin: No rashes, lesions or ulcers Psychiatry: Judgement and insight appear normal. Mood & affect appropriate.     Data Reviewed: I have personally reviewed following labs and imaging studies  CBC: Recent Labs  Lab 03/01/19 1540 03/03/19 0230  WBC 7.9 8.3  HGB 10.8* 10.0*  HCT 33.5* 31.6*  MCV  89.1 89.3  PLT 211 409   Basic Metabolic Panel: Recent Labs  Lab 03/01/19 1540 03/02/19 0554 03/03/19 0230 03/04/19 0333 03/06/19 0706  NA 134* 132* 136 137 137  K 4.1 3.6 4.1 4.0 4.1  CL 101 97* 105 104 104  CO2 16* 22 22 22 22   GLUCOSE 221* 179* 125* 121* 191*  BUN 35* 37* 28* 23 15  CREATININE 2.41* 2.65* 1.97* 1.77* 1.63*  CALCIUM 9.3 8.8* 8.8* 8.9 9.2   GFR: Estimated Creatinine Clearance: 61.7 mL/min  (A) (by C-G formula based on SCr of 1.63 mg/dL (H)). Liver Function Tests: Recent Labs  Lab 03/01/19 1540  AST 16  ALT 15  ALKPHOS 66  BILITOT 0.7  PROT 7.4  ALBUMIN 4.0   No results for input(s): LIPASE, AMYLASE in the last 168 hours. No results for input(s): AMMONIA in the last 168 hours. Coagulation Profile: Recent Labs  Lab 03/01/19 1540  INR 1.0   Cardiac Enzymes: No results for input(s): CKTOTAL, CKMB, CKMBINDEX, TROPONINI in the last 168 hours. BNP (last 3 results) No results for input(s): PROBNP in the last 8760 hours. HbA1C: No results for input(s): HGBA1C in the last 72 hours. CBG: Recent Labs  Lab 03/05/19 0624 03/05/19 1222 03/05/19 1640 03/05/19 2157 03/06/19 0618  GLUCAP 156* 195* 175* 175* 178*   Lipid Profile: No results for input(s): CHOL, HDL, LDLCALC, TRIG, CHOLHDL, LDLDIRECT in the last 72 hours. Thyroid Function Tests: No results for input(s): TSH, T4TOTAL, FREET4, T3FREE, THYROIDAB in the last 72 hours. Anemia Panel: No results for input(s): VITAMINB12, FOLATE, FERRITIN, TIBC, IRON, RETICCTPCT in the last 72 hours. Sepsis Labs: No results for input(s): PROCALCITON, LATICACIDVEN in the last 168 hours.  Recent Results (from the past 240 hour(s))  SARS CORONAVIRUS 2 (TAT 6-24 HRS) Nasopharyngeal Nasopharyngeal Swab     Status: None   Collection Time: 02/28/19  7:14 AM   Specimen: Nasopharyngeal Swab  Result Value Ref Range Status   SARS Coronavirus 2 NEGATIVE NEGATIVE Final    Comment: (NOTE) SARS-CoV-2 target nucleic acids are NOT DETECTED. The SARS-CoV-2 RNA is generally detectable in upper and lower respiratory specimens during the acute phase of infection. Negative results do not preclude SARS-CoV-2 infection, do not rule out co-infections with other pathogens, and should not be used as the sole basis for treatment or other patient management decisions. Negative results must be combined with clinical observations, patient history, and  epidemiological information. The expected result is Negative. Fact Sheet for Patients: SugarRoll.be Fact Sheet for Healthcare Providers: https://www.woods-mathews.com/ This test is not yet approved or cleared by the [place] FDA and  has been authorized for detection and/or diagnosis of SARS-CoV-2 by FDA under an Emergency Use Authorization (EUA). This EUA will remain  in effect (meaning this test can be used) for the duration of the COVID-19 declaration under Section 56 4(b)(1) of the Act, 21 U.S.C. section 360bbb-3(b)(1), unless the authorization is terminated or revoked sooner. Performed at [facility], [place] [address] [place], [place] [address]          Radiology Studies: No results found.      Scheduled Meds: . amLODipine  5 mg Oral Daily  . aspirin EC  81 mg Oral Daily  . docusate sodium  100 mg Oral Daily  . enoxaparin (LOVENOX) injection  30 mg Subcutaneous Q24H  . insulin aspart  0-24 Units Subcutaneous TID WC  . living well with diabetes book   Does not apply Once  . pravastatin  40 mg Oral q1800   Continuous Infusions:    LOS: 4 days    Time spent: 15 minutes     Barb Merino, MD [facility] Pager [phone]  If 7PM-7AM, please contact night-coverage [email or web address] Password [facility] 03/06/2019, 10:21 AM

## 2019-03-06 NOTE — Progress Notes (Addendum)
      [address]       [place] [address]             [phone]       4 Days Post-Op Procedure(s) (LRB): VIDEO BRONCHOSCOPY (N/A) VIDEO ASSISTED THORACOSCOPY (VATS)/RIGHT LOWER LOBECTOMY (Right)  Subjective: Patient without complaints this am, except bored.  Objective: Vital signs in last 24 hours: Temp:  [97.8 F (36.6 C)-98.5 F (36.9 C)] 98.2 F (36.8 C) (09/21 0407) Pulse Rate:  [74-100] 100 (09/21 0407) Cardiac Rhythm: Normal sinus rhythm (09/21 0031) Resp:  [14-20] 14 (09/21 0407) BP: (131-155)/(65-96) 155/96 (09/21 0407) SpO2:  [98 %-99 %] 99 % (09/21 0407)     Intake/Output from previous day: 09/20 0701 - 09/21 0700 In: 1335.8 [P.O.:600; I.V.:735.8] Out: -    Physical Exam:  Cardiovascular: RRR Pulmonary: Clear to auscultation bilaterally Abdomen: Soft, non tender, bowel sounds present. Extremities: No lower extremity edema. .   Lab Results: CBC: No results for input(s): WBC, HGB, HCT, PLT in the last 72 hours. BMET:  Recent Labs    03/04/19 0333  NA 137  K 4.0  CL 104  CO2 22  GLUCOSE 121*  BUN 23  CREATININE 1.77*  CALCIUM 8.9    PT/INR:  No results for input(s): LABPROT, INR in the last 72 hours. ABG:  INR: Will add last result for INR, ABG once components are confirmed Will add last 4 CBG results once components are confirmed  Assessment/Plan:  1. CV - SR 90's. Hypertensive this am with SBP in the 150's. On Amlodipine 5 mg daily 2.  Pulmonary - On room air.  3. AKI-Last creatinine decreased from 1.97 to 1.77.  Await this am's result 4. Anemia-Last H and H 10 and 31.6 5. To OR in am as long as renal function stable  Donielle M ZimmermanPA-C 03/06/2019,7:09 AM [phone]   Agree with above OR tomorrow for L VATS, RLLectomy

## 2019-03-07 ENCOUNTER — Inpatient Hospital Stay (HOSPITAL_COMMUNITY): Payer: Medicare Other | Admitting: Anesthesiology

## 2019-03-07 ENCOUNTER — Inpatient Hospital Stay (HOSPITAL_COMMUNITY): Payer: Medicare Other

## 2019-03-07 ENCOUNTER — Encounter (HOSPITAL_COMMUNITY)
Admission: RE | Disposition: A | Discharge status: Home / Self Care | Payer: Self-pay | Source: Home / Self Care | Attending: Thoracic Surgery (Cardiothoracic Vascular Surgery)

## 2019-03-07 DIAGNOSIS — R911 Solitary pulmonary nodule: Secondary | ICD-10-CM

## 2019-03-07 HISTORY — PX: VIDEO ASSISTED THORACOSCOPY (VATS)/ LOBECTOMY: SHX6169

## 2019-03-07 HISTORY — PX: VIDEO BRONCHOSCOPY: SHX5072

## 2019-03-07 LAB — GLUCOSE, CAPILLARY
Glucose-Capillary: 207 mg/dL — ABNORMAL HIGH (ref 70–99)
Glucose-Capillary: 287 mg/dL — ABNORMAL HIGH (ref 70–99)
Glucose-Capillary: 227 mg/dL — ABNORMAL HIGH (ref 70–99)
Glucose-Capillary: 180 mg/dL — ABNORMAL HIGH (ref 70–99)

## 2019-03-07 SURGERY — BRONCHOSCOPY, VIDEO-ASSISTED
Anesthesia: General | Site: Chest | Laterality: Right

## 2019-03-07 MED ORDER — MORPHINE SULFATE (PF) 2 MG/ML IV SOLN: 2.0000 mg | INTRAVENOUS | Status: DC | PRN

## 2019-03-07 MED ORDER — FENTANYL CITRATE (PF) 250 MCG/5ML IJ SOLN
INTRAMUSCULAR | Status: AC
  Filled 2019-03-07: qty 5

## 2019-03-07 MED ORDER — BUPIVACAINE LIPOSOME 1.3 % IJ SUSP
INTRAMUSCULAR | Status: DC | PRN
  Administered 2019-03-07: 20 mL

## 2019-03-07 MED ORDER — ESMOLOL HCL 100 MG/10ML IV SOLN
INTRAVENOUS | Status: AC
  Filled 2019-03-07: qty 10

## 2019-03-07 MED ORDER — ONDANSETRON HCL 4 MG/2ML IJ SOLN
INTRAMUSCULAR | Status: DC | PRN
  Administered 2019-03-07: 4 mg via INTRAVENOUS

## 2019-03-07 MED ORDER — DIPHENHYDRAMINE HCL 50 MG/ML IJ SOLN
INTRAMUSCULAR | Status: DC | PRN
  Administered 2019-03-07: 25 mg via INTRAVENOUS

## 2019-03-07 MED ORDER — BISACODYL 5 MG PO TBEC
10.0000 mg | DELAYED_RELEASE_TABLET | Freq: Every day | ORAL | Status: DC
  Administered 2019-03-08 – 2019-03-09 (×2): 10 mg via ORAL
  Filled 2019-03-07 (×2): qty 2

## 2019-03-07 MED ORDER — MIDAZOLAM HCL 2 MG/2ML IJ SOLN
INTRAMUSCULAR | Status: AC
  Filled 2019-03-07: qty 2

## 2019-03-07 MED ORDER — CHLORHEXIDINE GLUCONATE CLOTH 2 % EX PADS
6.0000 | MEDICATED_PAD | Freq: Every day | CUTANEOUS | Status: DC
  Administered 2019-03-08: 10:00:00 6 via TOPICAL

## 2019-03-07 MED ORDER — FENTANYL CITRATE (PF) 100 MCG/2ML IJ SOLN
INTRAMUSCULAR | Status: DC | PRN
  Administered 2019-03-07: 50 ug via INTRAVENOUS
  Administered 2019-03-07: 100 ug via INTRAVENOUS
  Administered 2019-03-07: 50 ug via INTRAVENOUS
  Administered 2019-03-07 (×2): 100 ug via INTRAVENOUS
  Administered 2019-03-07: 50 ug via INTRAVENOUS
  Administered 2019-03-07 (×3): 100 ug via INTRAVENOUS

## 2019-03-07 MED ORDER — PHENYLEPHRINE 40 MCG/ML (10ML) SYRINGE FOR IV PUSH (FOR BLOOD PRESSURE SUPPORT)
PREFILLED_SYRINGE | INTRAVENOUS | Status: DC | PRN
  Administered 2019-03-07: 120 ug via INTRAVENOUS

## 2019-03-07 MED ORDER — ESMOLOL HCL 100 MG/10ML IV SOLN
INTRAVENOUS | Status: DC | PRN
  Administered 2019-03-07 (×3): 10 mg via INTRAVENOUS
  Administered 2019-03-07: 20 mg via INTRAVENOUS

## 2019-03-07 MED ORDER — SUGAMMADEX SODIUM 500 MG/5ML IV SOLN
INTRAVENOUS | Status: AC
  Filled 2019-03-07: qty 5

## 2019-03-07 MED ORDER — BUPIVACAINE LIPOSOME 1.3 % IJ SUSP
20.0000 mL | Freq: Once | INTRAMUSCULAR | Status: DC
  Filled 2019-03-07: qty 20

## 2019-03-07 MED ORDER — SUGAMMADEX SODIUM 500 MG/5ML IV SOLN
INTRAVENOUS | Status: DC | PRN
  Administered 2019-03-07: 540 mg via INTRAVENOUS

## 2019-03-07 MED ORDER — PROPOFOL 10 MG/ML IV BOLUS
INTRAVENOUS | Status: DC | PRN
  Administered 2019-03-07: 200 mg via INTRAVENOUS

## 2019-03-07 MED ORDER — ONDANSETRON HCL 4 MG/2ML IJ SOLN: 4.0000 mg | Freq: Once | INTRAMUSCULAR | Status: DC | PRN

## 2019-03-07 MED ORDER — INSULIN ASPART 100 UNIT/ML ~~LOC~~ SOLN
12.0000 [IU] | Freq: Once | SUBCUTANEOUS | Status: AC
  Administered 2019-03-07: 12:00:00 12 [IU] via SUBCUTANEOUS

## 2019-03-07 MED ORDER — ROCURONIUM BROMIDE 50 MG/5ML IV SOSY
PREFILLED_SYRINGE | INTRAVENOUS | Status: DC | PRN
  Administered 2019-03-07: 50 mg via INTRAVENOUS
  Administered 2019-03-07: 40 mg via INTRAVENOUS
  Administered 2019-03-07: 60 mg via INTRAVENOUS

## 2019-03-07 MED ORDER — INSULIN ASPART 100 UNIT/ML ~~LOC~~ SOLN
SUBCUTANEOUS | Status: AC
  Filled 2019-03-07: qty 1

## 2019-03-07 MED ORDER — INSULIN ASPART 100 UNIT/ML ~~LOC~~ SOLN
0.0000 [IU] | Freq: Three times a day (TID) | SUBCUTANEOUS | Status: DC
  Administered 2019-03-07: 17:00:00 8 [IU] via SUBCUTANEOUS
  Administered 2019-03-07: 23:00:00 4 [IU] via SUBCUTANEOUS
  Administered 2019-03-08: 21:00:00 8 [IU] via SUBCUTANEOUS
  Administered 2019-03-08: 12:00:00 4 [IU] via SUBCUTANEOUS
  Administered 2019-03-08: 18:00:00 8 [IU] via SUBCUTANEOUS
  Administered 2019-03-08: 08:00:00 4 [IU] via SUBCUTANEOUS
  Administered 2019-03-09: 06:00:00 2 [IU] via SUBCUTANEOUS
  Administered 2019-03-09: 13:00:00 4 [IU] via SUBCUTANEOUS

## 2019-03-07 MED ORDER — LACTATED RINGERS IV SOLN
INTRAVENOUS | Status: DC | PRN
  Administered 2019-03-07 (×2): via INTRAVENOUS

## 2019-03-07 MED ORDER — SODIUM CHLORIDE (PF) 0.9 % IJ SOLN
INTRAMUSCULAR | Status: DC | PRN
  Administered 2019-03-07: 50 mL

## 2019-03-07 MED ORDER — SODIUM CHLORIDE 0.9 % IV SOLN
INTRAVENOUS | Status: DC | PRN
  Administered 2019-03-07: 08:00:00 30 ug/min via INTRAVENOUS

## 2019-03-07 MED ORDER — ROCURONIUM BROMIDE 10 MG/ML (PF) SYRINGE
PREFILLED_SYRINGE | INTRAVENOUS | Status: AC
  Filled 2019-03-07: qty 10

## 2019-03-07 MED ORDER — LIDOCAINE 2% (20 MG/ML) 5 ML SYRINGE
INTRAMUSCULAR | Status: DC | PRN
  Administered 2019-03-07: 100 mg via INTRAVENOUS

## 2019-03-07 MED ORDER — BUPIVACAINE HCL 0.5 % IJ SOLN
INTRAMUSCULAR | Status: DC | PRN
  Administered 2019-03-07: 30 mL

## 2019-03-07 MED ORDER — HEMOSTATIC AGENTS (NO CHARGE) OPTIME
TOPICAL | Status: DC | PRN
  Administered 2019-03-07: 1 via TOPICAL

## 2019-03-07 MED ORDER — PROPOFOL 10 MG/ML IV BOLUS
INTRAVENOUS | Status: AC
  Filled 2019-03-07: qty 20

## 2019-03-07 MED ORDER — DEXAMETHASONE SODIUM PHOSPHATE 10 MG/ML IJ SOLN
INTRAMUSCULAR | Status: DC | PRN
  Administered 2019-03-07: 10 mg via INTRAVENOUS

## 2019-03-07 MED ORDER — ACETAMINOPHEN 160 MG/5ML PO SOLN: 1000.0000 mg | Freq: Four times a day (QID) | ORAL | Status: DC

## 2019-03-07 MED ORDER — 0.9 % SODIUM CHLORIDE (POUR BTL) OPTIME
TOPICAL | Status: DC | PRN
  Administered 2019-03-07: 08:00:00 1000 mL

## 2019-03-07 MED ORDER — ACETAMINOPHEN 500 MG PO TABS
1000.0000 mg | ORAL_TABLET | Freq: Once | ORAL | Status: AC
  Administered 2019-03-07: 06:00:00 1000 mg via ORAL
  Filled 2019-03-07: qty 2

## 2019-03-07 MED ORDER — OXYCODONE HCL 5 MG PO TABS: 5.0000 mg | ORAL_TABLET | ORAL | Status: DC | PRN

## 2019-03-07 MED ORDER — CEFAZOLIN SODIUM-DEXTROSE 2-4 GM/100ML-% IV SOLN
2.0000 g | Freq: Three times a day (TID) | INTRAVENOUS | Status: AC
  Administered 2019-03-07 (×2): 2 g via INTRAVENOUS
  Filled 2019-03-07 (×2): qty 100

## 2019-03-07 MED ORDER — SODIUM CHLORIDE 0.9 % IV SOLN
INTRAVENOUS | Status: DC | PRN
  Administered 2019-03-07: 15:00:00 via INTRA_ARTERIAL

## 2019-03-07 MED ORDER — MIDAZOLAM HCL 5 MG/5ML IJ SOLN
INTRAMUSCULAR | Status: DC | PRN
  Administered 2019-03-07: 2 mg via INTRAVENOUS

## 2019-03-07 MED ORDER — ONDANSETRON HCL 4 MG/2ML IJ SOLN
INTRAMUSCULAR | Status: AC
  Filled 2019-03-07: qty 2

## 2019-03-07 MED ORDER — ONDANSETRON HCL 4 MG/2ML IJ SOLN: 4.0000 mg | Freq: Four times a day (QID) | INTRAMUSCULAR | Status: DC | PRN

## 2019-03-07 MED ORDER — LIDOCAINE 2% (20 MG/ML) 5 ML SYRINGE
INTRAMUSCULAR | Status: AC
  Filled 2019-03-07: qty 5

## 2019-03-07 MED ORDER — DIPHENHYDRAMINE HCL 50 MG/ML IJ SOLN
INTRAMUSCULAR | Status: AC
  Filled 2019-03-07: qty 1

## 2019-03-07 MED ORDER — DEXAMETHASONE SODIUM PHOSPHATE 10 MG/ML IJ SOLN
INTRAMUSCULAR | Status: AC
  Filled 2019-03-07: qty 1

## 2019-03-07 MED ORDER — GABAPENTIN 300 MG PO CAPS
300.0000 mg | ORAL_CAPSULE | Freq: Once | ORAL | Status: AC
  Administered 2019-03-07: 06:00:00 300 mg via ORAL
  Filled 2019-03-07: qty 1

## 2019-03-07 MED ORDER — BUPIVACAINE HCL (PF) 0.5 % IJ SOLN
INTRAMUSCULAR | Status: AC
  Filled 2019-03-07: qty 30

## 2019-03-07 MED ORDER — ACETAMINOPHEN 500 MG PO TABS
1000.0000 mg | ORAL_TABLET | Freq: Four times a day (QID) | ORAL | Status: DC
  Administered 2019-03-07 – 2019-03-09 (×7): 1000 mg via ORAL
  Filled 2019-03-07 (×7): qty 2

## 2019-03-07 MED ORDER — CEFAZOLIN SODIUM-DEXTROSE 2-3 GM-%(50ML) IV SOLR
INTRAVENOUS | Status: DC | PRN
  Administered 2019-03-07: 3 g via INTRAVENOUS

## 2019-03-07 MED ORDER — SENNOSIDES-DOCUSATE SODIUM 8.6-50 MG PO TABS
1.0000 | ORAL_TABLET | Freq: Every day | ORAL | Status: DC
  Administered 2019-03-07 – 2019-03-08 (×2): 1 via ORAL
  Filled 2019-03-07 (×2): qty 1

## 2019-03-07 MED ORDER — TRAMADOL HCL 50 MG PO TABS: 50.0000 mg | ORAL_TABLET | Freq: Four times a day (QID) | ORAL | Status: DC | PRN

## 2019-03-07 MED ORDER — FENTANYL CITRATE (PF) 100 MCG/2ML IJ SOLN: 25.0000 ug | INTRAMUSCULAR | Status: DC | PRN

## 2019-03-07 SURGICAL SUPPLY — 110 items
ADH SKN CLS APL DERMABOND .7 (GAUZE/BANDAGES/DRESSINGS) ×2
APPLIER CLIP ROT 10 11.4 M/L (STAPLE)
APR CLP MED LRG 11.4X10 (STAPLE)
BAG SPEC RTRVL LRG 6X4 10 (ENDOMECHANICALS)
BLADE CLIPPER SURG (BLADE) ×2 IMPLANT
CANISTER SUCT 3000ML PPV (MISCELLANEOUS) ×4 IMPLANT
CATH THORACIC 28FR (CATHETERS) IMPLANT
CATH THORACIC 28FR RT ANG (CATHETERS) IMPLANT
CATH THORACIC 36FR (CATHETERS) IMPLANT
CATH THORACIC 36FR RT ANG (CATHETERS) IMPLANT
CATH TROCAR 20FR (CATHETERS) IMPLANT
CELLS DAT CNTRL 66122 CELL SVR (MISCELLANEOUS) ×2 IMPLANT
CLIP APPLIE ROT 10 11.4 M/L (STAPLE) IMPLANT
CLIP VESOCCLUDE MED 6/CT (CLIP) ×4 IMPLANT
CONN ST 1/4X3/8  BEN (MISCELLANEOUS)
CONN ST 1/4X3/8 BEN (MISCELLANEOUS) IMPLANT
CONN Y 3/8X3/8X3/8  BEN (MISCELLANEOUS)
CONN Y 3/8X3/8X3/8 BEN (MISCELLANEOUS) IMPLANT
CONT SPEC 4OZ CLIKSEAL STRL BL (MISCELLANEOUS) ×22 IMPLANT
COVER SURGICAL LIGHT HANDLE (MISCELLANEOUS) ×2 IMPLANT
COVER WAND RF STERILE (DRAPES) ×2 IMPLANT
DEFOGGER SCOPE WARMER CLEARIFY (MISCELLANEOUS) ×4 IMPLANT
DERMABOND ADVANCED (GAUZE/BANDAGES/DRESSINGS) ×2
DERMABOND ADVANCED .7 DNX12 (GAUZE/BANDAGES/DRESSINGS) IMPLANT
DISSECTOR BLUNT TIP ENDO 5MM (MISCELLANEOUS) ×4 IMPLANT
DRAIN CHANNEL 28F RND 3/8 FF (WOUND CARE) IMPLANT
DRAIN CHANNEL 32F RND 10.7 FF (WOUND CARE) IMPLANT
DRAPE WARM FLUID 44X44 (DRAPES) ×4 IMPLANT
ELECT BLADE 6.5 EXT (BLADE) ×4 IMPLANT
ELECT REM PT RETURN 9FT ADLT (ELECTROSURGICAL) ×4
ELECTRODE REM PT RTRN 9FT ADLT (ELECTROSURGICAL) ×2 IMPLANT
GAUZE SPONGE 4X4 12PLY STRL (GAUZE/BANDAGES/DRESSINGS) ×4 IMPLANT
GAUZE SPONGE 4X4 12PLY STRL LF (GAUZE/BANDAGES/DRESSINGS) ×2 IMPLANT
GLOVE BIO SURGEON STRL SZ7 (GLOVE) ×8 IMPLANT
GOWN STRL REUS W/ TWL LRG LVL3 (GOWN DISPOSABLE) ×4 IMPLANT
GOWN STRL REUS W/ TWL XL LVL3 (GOWN DISPOSABLE) ×2 IMPLANT
GOWN STRL REUS W/TWL LRG LVL3 (GOWN DISPOSABLE) ×8
GOWN STRL REUS W/TWL XL LVL3 (GOWN DISPOSABLE) ×4
HANDLE STAPLE  ENDO EGIA 4 STD (STAPLE) ×2
HANDLE STAPLE ENDO EGIA 4 STD (STAPLE) IMPLANT
HANDLE STAPLE ENDO GIA SHORT (STAPLE) ×1
HEMOSTAT SURGICEL 2X14 (HEMOSTASIS) ×2 IMPLANT
KIT BASIN OR (CUSTOM PROCEDURE TRAY) ×4 IMPLANT
KIT SUCTION CATH 14FR (SUCTIONS) IMPLANT
KIT TURNOVER KIT B (KITS) ×4 IMPLANT
NDL SPNL 18GX3.5 QUINCKE PK (NEEDLE) IMPLANT
NEEDLE 22X1 1/2 (OR ONLY) (NEEDLE) ×2 IMPLANT
NEEDLE SPNL 18GX3.5 QUINCKE PK (NEEDLE) IMPLANT
NS IRRIG 1000ML POUR BTL (IV SOLUTION) ×12 IMPLANT
PACK CHEST (CUSTOM PROCEDURE TRAY) ×4 IMPLANT
PACK UNIVERSAL I (CUSTOM PROCEDURE TRAY) ×4 IMPLANT
PAD ARMBOARD 7.5X6 YLW CONV (MISCELLANEOUS) ×8 IMPLANT
POUCH ENDO CATCH II 15MM (MISCELLANEOUS) ×2 IMPLANT
POUCH SPECIMEN RETRIEVAL 10MM (ENDOMECHANICALS) IMPLANT
RELOAD EGIA 45 MED/THCK PURPLE (STAPLE) ×8 IMPLANT
RELOAD EGIA 60 MED/THCK PURPLE (STAPLE) ×12 IMPLANT
RELOAD EGIA TRIS TAN 45 CVD (STAPLE) ×16 IMPLANT
RELOAD STAPLE 45 TAN MED CVD (STAPLE) IMPLANT
RELOAD STAPLE 60 MED/THCK ART (STAPLE) IMPLANT
RETRACTOR WND ALEXIS 18 MED (MISCELLANEOUS) IMPLANT
RETRACTOR WOUND ALXS 19CM XSML (INSTRUMENTS) IMPLANT
RTRCTR WOUND ALEXIS 18CM MED (MISCELLANEOUS) ×4
RTRCTR WOUND ALEXIS 19CM XSML (INSTRUMENTS) ×4
SCISSORS LAP 5X35 DISP (ENDOMECHANICALS) IMPLANT
SEALANT PROGEL (MISCELLANEOUS) IMPLANT
SEALANT SURG COSEAL 4ML (VASCULAR PRODUCTS) IMPLANT
SEALANT SURG COSEAL 8ML (VASCULAR PRODUCTS) IMPLANT
SEALER LIGASURE MARYLAND 30 (ELECTROSURGICAL) ×4 IMPLANT
SET IRRIG TUBING LAPAROSCOPIC (IRRIGATION / IRRIGATOR) ×2 IMPLANT
SOL ANTI FOG 6CC (MISCELLANEOUS) ×2 IMPLANT
SOLUTION ANTI FOG 6CC (MISCELLANEOUS) ×2
SPECIMEN JAR MEDIUM (MISCELLANEOUS) IMPLANT
SPONGE INTESTINAL PEANUT (DISPOSABLE) ×10 IMPLANT
SPONGE TONSIL TAPE 1 RFD (DISPOSABLE) ×4 IMPLANT
STAPLER ENDO GIA 12 SHRT THIN (STAPLE) ×2 IMPLANT
STAPLER ENDO GIA 12MM SHORT (STAPLE) ×3 IMPLANT
STOPCOCK 4 WAY LG BORE MALE ST (IV SETS) ×4 IMPLANT
SUT MNCRL AB 3-0 PS2 18 (SUTURE) IMPLANT
SUT MON AB 2-0 CT1 36 (SUTURE) IMPLANT
SUT PDS AB 1 CTX 36 (SUTURE) IMPLANT
SUT PROLENE 4 0 RB 1 (SUTURE)
SUT PROLENE 4-0 RB1 .5 CRCL 36 (SUTURE) IMPLANT
SUT SILK  1 MH (SUTURE) ×2
SUT SILK 1 MH (SUTURE) IMPLANT
SUT SILK 1 TIES 10X30 (SUTURE) ×4 IMPLANT
SUT SILK 2 0 SH (SUTURE) ×2 IMPLANT
SUT SILK 2 0SH CR/8 30 (SUTURE) IMPLANT
SUT VIC AB 1 CTX 36 (SUTURE)
SUT VIC AB 1 CTX36XBRD ANBCTR (SUTURE) IMPLANT
SUT VIC AB 2-0 CT1 27 (SUTURE) ×8
SUT VIC AB 2-0 CT1 TAPERPNT 27 (SUTURE) ×4 IMPLANT
SUT VIC AB 3-0 SH 27 (SUTURE) ×8
SUT VIC AB 3-0 SH 27X BRD (SUTURE) ×2 IMPLANT
SUT VICRYL 0 UR6 27IN ABS (SUTURE) ×6 IMPLANT
SUT VICRYL 2 TP 1 (SUTURE) IMPLANT
SYR 10ML LL (SYRINGE) ×4 IMPLANT
SYR 30ML LL (SYRINGE) ×4 IMPLANT
SYSTEM SAHARA CHEST DRAIN ATS (WOUND CARE) ×4 IMPLANT
TAPE CLOTH 4X10 WHT NS (GAUZE/BANDAGES/DRESSINGS) ×4 IMPLANT
TAPE CLOTH SURG 6X10 WHT LF (GAUZE/BANDAGES/DRESSINGS) ×2 IMPLANT
TIP APPLICATOR SPRAY EXTEND 16 (VASCULAR PRODUCTS) IMPLANT
TOWEL GREEN STERILE (TOWEL DISPOSABLE) ×4 IMPLANT
TOWEL GREEN STERILE FF (TOWEL DISPOSABLE) ×4 IMPLANT
TRAY FOLEY MTR SLVR 16FR STAT (SET/KITS/TRAYS/PACK) ×4 IMPLANT
TROCAR BLADELESS 15MM (ENDOMECHANICALS) ×2 IMPLANT
TROCAR XCEL 12X100 BLDLESS (ENDOMECHANICALS) ×4 IMPLANT
TROCAR XCEL BLADELESS 5X75MML (TROCAR) ×4 IMPLANT
TUBING EXTENTION W/L.L. (IV SETS) ×4 IMPLANT
TUBING LAP HI FLOW INSUFFLATIO (TUBING) ×2 IMPLANT
WATER STERILE IRR 1000ML POUR (IV SOLUTION) ×4 IMPLANT

## 2019-03-07 NOTE — Anesthesia Procedure Notes (Signed)
Procedure Name: Intubation Date/Time: 03/07/2019 7:41 AM Performed by: Lance Coon, CRNA Pre-anesthesia Checklist: Patient identified, Emergency Drugs available, Suction available, Patient being monitored and Timeout performed Patient Re-evaluated:Patient Re-evaluated prior to induction Oxygen Delivery Method: Circle system utilized Preoxygenation: Pre-oxygenation with 100% oxygen Induction Type: IV induction Ventilation: Mask ventilation without difficulty Laryngoscope Size: Miller and 3 Grade View: Grade I Endobronchial tube: Left, EBT position confirmed by auscultation, Double lumen EBT and EBT position confirmed by fiberoptic bronchoscope and 39 Fr Number of attempts: 1 Airway Equipment and Method: Stylet Placement Confirmation: ETT inserted through vocal cords under direct vision,  positive ETCO2 and breath sounds checked- equal and bilateral Tube secured with: Tape Dental Injury: Teeth and Oropharynx as per pre-operative assessment

## 2019-03-07 NOTE — Transfer of Care (Signed)
Immediate Anesthesia Transfer of Care Note  Patient: Gregory Lindsey  Procedure(s) Performed: VIDEO BRONCHOSCOPY (N/A ) VIDEO ASSISTED THORACOSCOPY (VATS)/RIGHT LOWER LOBECTOMY (Right Chest)  Patient Location: PACU  Anesthesia Type:General  Level of Consciousness: drowsy and patient cooperative  Airway & Oxygen Therapy: Patient Spontanous Breathing  Post-op Assessment: Report given to RN and Post -op Vital signs reviewed and stable  Post vital signs: Reviewed and stable  Last Vitals:  Vitals Value Taken Time  BP    Temp    Pulse    Resp    SpO2      Last Pain:  Vitals:   03/07/19 0434  TempSrc: Oral  PainSc:       Patients Stated Pain Goal: 3 (97/67/34 1937)  Complications: No apparent anesthesia complications

## 2019-03-07 NOTE — Op Note (Signed)
[address]       [place] [address]             [phone]        03/07/2019  Patient:  Ronn Smolinsky Sidor Pre-Op Dx: Right lower lobe pulmonary nodule   Post-op Dx:  same Procedure: - Bronchoscopy - Right Video assisted thoracoscopy - Right lower lobectomy - Mediastinal lymph node sampling - Intercostal nerve block  Surgeon and Role:      * Delno Blaisdell, Lucile Crater, MD - Primary    * Dr. Servando Snare, MD - assisting   Anesthesia  general EBL:  100 ml Blood Administration: none Specimen:  Right lower lobe.  Stations 7, 9, 11, and 12 lymph nodes  Drains: 28 F argyle chest tube in right chest Counts: correct   Indications: Mr. Vicuna is a 68 yo male with a right lower lobe pulmonary nodule who had previous non-diagnostic navigational bronchoscopy.  I explained to him that I was not confident with the biopsy results and I am still concerned that this is a cancer.  We discussed other potential options which included a CT-guided biopsy versus right VATS lobectomy.  My concern with the CT-guided biopsy is location of the nodule in its proximity to the diaphragm.  Additionally there is a good deal of lung that would have to be traversed to sample the nodule.  Given the location and the depth of the nodule,  A wedge would not be possible either.  In my opinion there is a 90 to 95% chance that this is a malignancy and I explained to him that a lobectomy would be recommended.  He is agreeable to proceed with that plan given the circumstances.  Findings: Bronchoscopy revealed normal anatomy and the previous surgical biopsy site was well healed.  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    Operative Technique: After the risks, benefits and alternatives were thoroughly discussed, the patient was brought to the operative theatre.  Anesthesia was induced, and the bronchoscope was  passed through the endotracheal tube.  All segmental bronchi were visualized.  The endotracheal tube was then exchanged for a double lumen tube.  The patient was then placed in a left lateral decubitus position and was prepped and draped in normal sterile fashion.  An appropriate surgical pause was performed, and pre-operative antibiotics were dosed accordingly.  We began with 3cm incision in the anterior axillary line at the 8th intercostal space.  The chest was entered, and we then placed a 1cm incision at the 10th intercostal space, and introduced our camera port.  The lung was directly visualized.   We then made a 4cm incision in the 3rd intercostal space, and entered the chest under direct visualization.  The lung was then retracted superiorly, and the inferior pulmonary ligament was divided.  The hilum was mobilized anteriorly and posteriorly.  We identified the pulmonary vein, and after careful isolation, it was divided with an endo- GIA stapler.  We next moved to the  Pulmonary artery.  The artery was then divided with an endo-GIA stapler.  The bronchus to the lower lobe was then isolated.  After a test clamp, with good ventilation of the upper and middle lobe, the bronchus was then divided.  The fissure was completed, and the specimen was passed into an endocatch bag.  It was removed from the superior access site.  Lymph nodes were then sampled at levels 7.  There were no obvious lymph nodes at level 4  The chest was irrigated, and an air leak test was performed.  An intercostal nerve block was performed under direct visualization.  A 19F chest with then placed, and we watch the remaining lobes re-expand.  The skin and soft tissue were closed with absorbable suture    The patient tolerated the procedure without any immediate complications, and was transferred to the PACU in stable condition.  Tanasia Budzinski Bary Leriche

## 2019-03-07 NOTE — Anesthesia Procedure Notes (Signed)
Arterial Line Insertion Start/End9/22/2020 6:45 AM, 03/07/2019 7:00 AM Performed by: Lance Coon, CRNA, CRNA  Preanesthetic checklist: patient identified, IV checked, site marked, risks and benefits discussed, surgical consent, monitors and equipment checked, pre-op evaluation, timeout performed and anesthesia consent Lidocaine 1% used for infiltration Left, radial was placed Catheter size: 20 G Hand hygiene performed , maximum sterile barriers used  and Seldinger technique used  Attempts: 1 Procedure performed without using ultrasound guided technique. Following insertion, dressing applied and Biopatch. Post procedure assessment: normal and unchanged  Patient tolerated the procedure well with no immediate complications.

## 2019-03-07 NOTE — Anesthesia Postprocedure Evaluation (Signed)
Anesthesia Post Note  Patient: Gregory Lindsey  Procedure(s) Performed: VIDEO BRONCHOSCOPY (N/A ) VIDEO ASSISTED THORACOSCOPY (VATS)/RIGHT LOWER LOBECTOMY (Right Chest)     Patient location during evaluation: PACU Anesthesia Type: General Level of consciousness: awake and alert Pain management: pain level controlled Vital Signs Assessment: post-procedure vital signs reviewed and stable Respiratory status: spontaneous breathing, nonlabored ventilation, respiratory function stable and patient connected to nasal cannula oxygen Cardiovascular status: blood pressure returned to baseline and stable Postop Assessment: no apparent nausea or vomiting Anesthetic complications: no    Last Vitals:  Vitals:   03/07/19 1809 03/07/19 2055  BP: 138/79 136/73  Pulse: 79 83  Resp: 17 (!) 22  Temp: 36.7 C 36.6 C  SpO2: 99% 99%    Last Pain:  Vitals:   03/07/19 2055  TempSrc: Oral  PainSc:                  Catalina Gravel

## 2019-03-07 NOTE — Anesthesia Procedure Notes (Signed)
Central Venous Catheter Insertion Performed by: Catalina Gravel, MD, anesthesiologist Start/End9/22/2020 7:06 AM, 03/07/2019 7:16 AM Preanesthetic checklist: patient identified, IV checked, site marked, risks and benefits discussed, surgical consent, monitors and equipment checked, pre-op evaluation, timeout performed and anesthesia consent Position: Trendelenburg Lidocaine 1% used for infiltration and patient sedated Hand hygiene performed , maximum sterile barriers used  and Seldinger technique used Catheter size: 8 Fr Total catheter length 16. Central line was placed.Double lumen Procedure performed using ultrasound guided technique. Ultrasound Notes:anatomy identified, needle tip was noted to be adjacent to the nerve/plexus identified, no ultrasound evidence of intravascular and/or intraneural injection and image(s) printed for medical record Attempts: 1 Following insertion, line sutured, dressing applied and Biopatch. Post procedure assessment: blood return through all ports, free fluid flow and no air  Patient tolerated the procedure well with no immediate complications.

## 2019-03-07 NOTE — Progress Notes (Signed)
Pt received from PACU. CHG bath given. Telebox 13 applied/ccmd notified. Vitals stable. Pt denies complaints. A Line d/c prior to arrival. Pt has callbell within reach. Will continue to monitor.  Jerald Kief, RN

## 2019-03-07 NOTE — Progress Notes (Signed)
PROGRESS NOTE    Gregory Lindsey  XNA:[id number] DOB: 1951/01/02 DOA: 03/02/2019 PCP: Lemmie Evens, MD    Brief Narrative:   68 year old gentleman with history of hypertension, hyperlipidemia and diabetes who was recently found to have right lower lobe pulmonary nodule, underwent bronchoscopy and biopsy that was nondiagnostic.  He was brought in for elective lobectomy, found to have elevated BUN and creatinine hence needed admission and treatment before undergoing surgical procedure.  Medicine is consulted for co-management. 03/07/2019: Patient underwent bronchoscopy, VATS with right lower lobe lobectomy.  Assessment & Plan:   Active Problems:   HTN (hypertension)   Diabetes ([place])   Right lower lobe lung mass   Acute kidney injury (AKI) with acute tubular necrosis (ATN) (HCC)   Dyslipidemia  Acute kidney injury with history of stage III chronic kidney disease:  Pre - Renal failure.   His baseline creatinine is about 1.7.  Treated with IV fluids with improvement.  Renal function normalized.  Underwent surgery today.  Recheck tomorrow morning to ensure stabilization.  Type 2 diabetes: Patient is on metformin at home.  With AKI and anticipated procedures, will discontinue metformin and keep on sliding scale insulin. Once his stabilize renal functions, he can go back on metformin on discharge.  Hypertension: Discontinued lisinopril and started on amlodipine. Will monitor.  If blood pressures remain persistently elevated, will add lisinopril postop.  Right lower lobe lung mass: Status post bronc and lobectomy today.  Postoperative management, pain management, chest tube management as per surgery.    Thank you for involving [place] in this patient's care.  We will continue to follow along with you during his hospital stay.  DVT prophylaxis: Lovenox subcu Code Status: Full code Family Communication: None Disposition Plan: Pending    Procedures:   Bronc, VATS, lobectomy right  lower lobe, 03/07/2019  Antimicrobials:   None   Subjective: Seen and examined.  No overnight events.  Came back from surgery in the afternoon.  He was without any complaints, sleepy from anesthesia.   Objective: Vitals:   03/07/19 1232 03/07/19 1245 03/07/19 1300 03/07/19 1318  BP: (!) 143/81  (!) 148/79 (!) 147/86  Pulse: 84 85 81 84  Resp: 17 12 13 15   Temp:  98.1 F (36.7 C)  98.1 F (36.7 C)  TempSrc:    Oral  SpO2: 98% 98% 97% 99%  Weight:      Height:        Intake/Output Summary (Last 24 hours) at 03/07/2019 1511 Last data filed at 03/07/2019 1307 Gross per 24 hour  Intake 1500 ml  Output 806 ml  Net 694 ml   Filed Weights   03/02/19 0549 03/02/19 1400  Weight: (!) 137 kg 134.9 kg    Examination:  General exam: Appears calm and comfortable, on room air, Respiratory system:  Respiratory effort normal.  On room air.  Without any distress.  He has a chest tube with minimal pink  drain on right side.  Decreased air entry on the bases. Cardiovascular system: S1 & S2 heard, RRR. No JVD, murmurs, rubs, gallops or clicks. No pedal edema. Gastrointestinal system: Abdomen is nondistended, soft and nontender. No organomegaly or masses felt. Normal bowel sounds heard. Central nervous system: Alert and oriented. No focal neurological deficits.  Sleepy. Extremities: Symmetric 5 x 5 power. Skin: No rashes, lesions or ulcers Psychiatry: Judgement and insight appear normal. Mood & affect appropriate.     Data Reviewed: I have personally reviewed following labs and imaging studies  CBC:  Recent Labs  Lab 03/01/19 1540 03/03/19 0230  WBC 7.9 8.3  HGB 10.8* 10.0*  HCT 33.5* 31.6*  MCV 89.1 89.3  PLT 211 270   Basic Metabolic Panel: Recent Labs  Lab 03/01/19 1540 03/02/19 0554 03/03/19 0230 03/04/19 0333 03/06/19 0706  NA 134* 132* 136 137 137  K 4.1 3.6 4.1 4.0 4.1  CL 101 97* 105 104 104  CO2 16* 22 22 22 22   GLUCOSE 221* 179* 125* 121* 191*  BUN 35* 37*  28* 23 15  CREATININE 2.41* 2.65* 1.97* 1.77* 1.63*  CALCIUM 9.3 8.8* 8.8* 8.9 9.2   GFR: Estimated Creatinine Clearance: 61.7 mL/min (A) (by C-G formula based on SCr of 1.63 mg/dL (H)). Liver Function Tests: Recent Labs  Lab 03/01/19 1540  AST 16  ALT 15  ALKPHOS 66  BILITOT 0.7  PROT 7.4  ALBUMIN 4.0   No results for input(s): LIPASE, AMYLASE in the last 168 hours. No results for input(s): AMMONIA in the last 168 hours. Coagulation Profile: Recent Labs  Lab 03/01/19 1540  INR 1.0   Cardiac Enzymes: No results for input(s): CKTOTAL, CKMB, CKMBINDEX, TROPONINI in the last 168 hours. BNP (last 3 results) No results for input(s): PROBNP in the last 8760 hours. HbA1C: No results for input(s): HGBA1C in the last 72 hours. CBG: Recent Labs  Lab 03/06/19 1113 03/06/19 1627 03/06/19 2201 03/07/19 0546 03/07/19 1154  GLUCAP 204* 164* 269* 207* 287*   Lipid Profile: No results for input(s): CHOL, HDL, LDLCALC, TRIG, CHOLHDL, LDLDIRECT in the last 72 hours. Thyroid Function Tests: No results for input(s): TSH, T4TOTAL, FREET4, T3FREE, THYROIDAB in the last 72 hours. Anemia Panel: No results for input(s): VITAMINB12, FOLATE, FERRITIN, TIBC, IRON, RETICCTPCT in the last 72 hours. Sepsis Labs: No results for input(s): PROCALCITON, LATICACIDVEN in the last 168 hours.  Recent Results (from the past 240 hour(s))  SARS CORONAVIRUS 2 (TAT 6-24 HRS) Nasopharyngeal Nasopharyngeal Swab     Status: None   Collection Time: 02/28/19  7:14 AM   Specimen: Nasopharyngeal Swab  Result Value Ref Range Status   SARS Coronavirus 2 NEGATIVE NEGATIVE Final    Comment: (NOTE) SARS-CoV-2 target nucleic acids are NOT DETECTED. The SARS-CoV-2 RNA is generally detectable in upper and lower respiratory specimens during the acute phase of infection. Negative results do not preclude SARS-CoV-2 infection, do not rule out co-infections with other pathogens, and should not be used as the sole basis  for treatment or other patient management decisions. Negative results must be combined with clinical observations, patient history, and epidemiological information. The expected result is Negative. Fact Sheet for Patients: SugarRoll.be Fact Sheet for Healthcare Providers: https://www.woods-mathews.com/ This test is not yet approved or cleared by the [place] FDA and  has been authorized for detection and/or diagnosis of SARS-CoV-2 by FDA under an Emergency Use Authorization (EUA). This EUA will remain  in effect (meaning this test can be used) for the duration of the COVID-19 declaration under Section 56 4(b)(1) of the Act, 21 U.S.C. section 360bbb-3(b)(1), unless the authorization is terminated or revoked sooner. Performed at [facility], [place] [address] [place], [place] [address]          Radiology Studies: Dg Chest Port 1 View  Result Date: 03/07/2019 CLINICAL DATA:  Followup right lower lobectomy EXAM: PORTABLE CHEST 1 VIEW COMPARISON:  03/01/2019 FINDINGS: Right chest tube in place. Right internal jugular central line tip in the SVC above the right atrium. Tiny amount of pleural air at  the apex. Lungs are well aerated. IMPRESSION: Status post right lower lobectomy. Tiny amount of pleural air on the right. No unexpected finding. Electronically Signed   By: Nelson Chimes M.D.   On: 03/07/2019 12:39        Scheduled Meds: . acetaminophen  1,000 mg Oral Q6H   Or  . acetaminophen (TYLENOL) oral liquid 160 mg/5 mL  1,000 mg Oral Q6H  . amLODipine  5 mg Oral Daily  . aspirin EC  81 mg Oral Daily  . bisacodyl  10 mg Oral Daily  . bupivacaine liposome  20 mL Infiltration Once  . docusate sodium  100 mg Oral Daily  . enoxaparin (LOVENOX) injection  30 mg Subcutaneous Q24H  . insulin aspart      . insulin aspart  0-24 Units Subcutaneous TID AC & HS  . living well with diabetes book   Does not apply Once  . pravastatin  40 mg  Oral q1800  . senna-docusate  1 tablet Oral QHS   Continuous Infusions: . sodium chloride    .  ceFAZolin (ANCEF) IV       LOS: 5 days    Time spent: 15 minutes     Barb Merino, MD [facility] Pager [phone]  If 7PM-7AM, please contact night-coverage [email or web address] Password [email or web address] 03/07/2019, 3:11 PM

## 2019-03-08 ENCOUNTER — Encounter (HOSPITAL_COMMUNITY): Payer: Self-pay | Admitting: Thoracic Surgery (Cardiothoracic Vascular Surgery)

## 2019-03-08 ENCOUNTER — Inpatient Hospital Stay (HOSPITAL_COMMUNITY): Payer: Medicare Other

## 2019-03-08 DIAGNOSIS — E0800 Diabetes mellitus due to underlying condition with hyperosmolarity without nonketotic hyperglycemic-hyperosmolar coma (NKHHC): Secondary | ICD-10-CM

## 2019-03-08 DIAGNOSIS — Z794 Long term (current) use of insulin: Secondary | ICD-10-CM

## 2019-03-08 LAB — CBC
HCT: 30.7 % — ABNORMAL LOW (ref 39.0–52.0)
Hemoglobin: 9.6 g/dL — ABNORMAL LOW (ref 13.0–17.0)
MCH: 28.5 pg (ref 26.0–34.0)
MCHC: 31.3 g/dL (ref 30.0–36.0)
MCV: 91.1 fL (ref 80.0–100.0)
Platelets: 236 10*3/uL (ref 150–400)
RBC: 3.37 MIL/uL — ABNORMAL LOW (ref 4.22–5.81)
RDW: 12.6 % (ref 11.5–15.5)
WBC: 14 10*3/uL — ABNORMAL HIGH (ref 4.0–10.5)
nRBC: 0 % (ref 0.0–0.2)

## 2019-03-08 LAB — GLUCOSE, CAPILLARY
Glucose-Capillary: 175 mg/dL — ABNORMAL HIGH (ref 70–99)
Glucose-Capillary: 188 mg/dL — ABNORMAL HIGH (ref 70–99)
Glucose-Capillary: 241 mg/dL — ABNORMAL HIGH (ref 70–99)
Glucose-Capillary: 231 mg/dL — ABNORMAL HIGH (ref 70–99)

## 2019-03-08 LAB — BASIC METABOLIC PANEL
Anion gap: 9 (ref 5–15)
BUN: 15 mg/dL (ref 8–23)
CO2: 23 mmol/L (ref 22–32)
Calcium: 8.4 mg/dL — ABNORMAL LOW (ref 8.9–10.3)
Chloride: 104 mmol/L (ref 98–111)
Creatinine, Ser: 1.82 mg/dL — ABNORMAL HIGH (ref 0.61–1.24)
GFR calc Af Amer: 44 mL/min — ABNORMAL LOW (ref >60)
GFR calc non Af Amer: 38 mL/min — ABNORMAL LOW (ref >60)
Glucose, Bld: 209 mg/dL — ABNORMAL HIGH (ref 70–99)
Potassium: 4.4 mmol/L (ref 3.5–5.1)
Sodium: 136 mmol/L (ref 135–145)

## 2019-03-08 NOTE — Progress Notes (Signed)
PROGRESS NOTE    Gregory Lindsey  PTW:[id number] DOB: 1950/11/23 DOA: 03/02/2019 PCP: Lemmie Evens, MD    Brief Narrative:  68 year old gentleman with prior history of hypertension, hyperlipidemia, diabetes mellitus was found to have a right lower lobe pulmonary nodule status post bronchoscopy and the biopsy results were nondiagnostic he was brought in for elective lobectomy and and found to have AKI.  Medicine consulted for management evaluation of AKI.  Patient underwent bronchoscopy and VATS with right lower lobectomy. Assessment & Plan:   Active Problems:   HTN (hypertension)   Diabetes ([place])   Right lower lobe lung mass   Acute kidney injury (AKI) with acute tubular necrosis (ATN) (HCC)   Dyslipidemia   AKI  on stage III CKD Baseline creatinine is about 1.7 admitted with a creatinine of 2.4. Patient's creatinines ranging between 1.6-1.8 at this time.    Type 2 diabetes mellitus CBG (last 3)  Recent Labs    03/07/19 2053 03/08/19 0636 03/08/19 1142  GLUCAP 180* 175* 188*   Patient on sliding scale insulin while inpatient. On metformin at home , patient's hemoglobin A1c is 9.9. optimally will need to start the patient on long acting insulin regimen.  Will discuss with the patient .     Hypertension:  Well controlled. Currently on amlodipine 5 mg daily. Can restart lisinopril at a later date/ outpatient in one week if creatinine remains stable.    Hyperlipidemia:  Resume pravachol.    Subjective: No new complaints.   Objective: Vitals:   03/08/19 0738 03/08/19 0842 03/08/19 1106 03/08/19 1143  BP: (!) 145/91 124/63  (!) 141/71  Pulse: 83   92  Resp: 16 (!) 21 18 (!) 21  Temp: 98.2 F (36.8 C)   98.4 F (36.9 C)  TempSrc: Oral   Oral  SpO2: 99% 96%  99%  Weight:      Height:        Intake/Output Summary (Last 24 hours) at 03/08/2019 1537 Last data filed at 03/08/2019 1500 Gross per 24 hour  Intake 469.6 ml  Output 1827 ml  Net -1357.4 ml    Filed Weights   03/02/19 0549 03/02/19 1400  Weight: (!) 137 kg 134.9 kg    Examination:  General exam: Appears calm and comfortable  Respiratory system: diminished on the right.  Cardiovascular system: S1 & S2 heard, RRR. No JVD, Gastrointestinal system: Abdomen is nondistended, soft and nontender. No organomegaly or masses felt. Normal bowel sounds heard. Central nervous system: Alert and oriented. No focal neurological deficits. Extremities: Symmetric 5 x 5 power. Skin: No rashes, lesions or ulcers Psychiatry:  Mood & affect appropriate.     Data Reviewed: I have personally reviewed following labs and imaging studies  CBC: Recent Labs  Lab 03/01/19 1540 03/03/19 0230 03/08/19 0242  WBC 7.9 8.3 14.0*  HGB 10.8* 10.0* 9.6*  HCT 33.5* 31.6* 30.7*  MCV 89.1 89.3 91.1  PLT 211 202 700   Basic Metabolic Panel: Recent Labs  Lab 03/02/19 0554 03/03/19 0230 03/04/19 0333 03/06/19 0706 03/08/19 0242  NA 132* 136 137 137 136  K 3.6 4.1 4.0 4.1 4.4  CL 97* 105 104 104 104  CO2 22 22 22 22 23   GLUCOSE 179* 125* 121* 191* 209*  BUN 37* 28* 23 15 15   CREATININE 2.65* 1.97* 1.77* 1.63* 1.82*  CALCIUM 8.8* 8.8* 8.9 9.2 8.4*   GFR: Estimated Creatinine Clearance: 55.2 mL/min (A) (by C-G formula based on SCr of 1.82 mg/dL (H)). Liver Function  Tests: Recent Labs  Lab 03/01/19 1540  AST 16  ALT 15  ALKPHOS 66  BILITOT 0.7  PROT 7.4  ALBUMIN 4.0   No results for input(s): LIPASE, AMYLASE in the last 168 hours. No results for input(s): AMMONIA in the last 168 hours. Coagulation Profile: Recent Labs  Lab 03/01/19 1540  INR 1.0   Cardiac Enzymes: No results for input(s): CKTOTAL, CKMB, CKMBINDEX, TROPONINI in the last 168 hours. BNP (last 3 results) No results for input(s): PROBNP in the last 8760 hours. HbA1C: No results for input(s): HGBA1C in the last 72 hours. CBG: Recent Labs  Lab 03/07/19 1154 03/07/19 1654 03/07/19 2053 03/08/19 0636 03/08/19 1142   GLUCAP 287* 227* 180* 175* 188*   Lipid Profile: No results for input(s): CHOL, HDL, LDLCALC, TRIG, CHOLHDL, LDLDIRECT in the last 72 hours. Thyroid Function Tests: No results for input(s): TSH, T4TOTAL, FREET4, T3FREE, THYROIDAB in the last 72 hours. Anemia Panel: No results for input(s): VITAMINB12, FOLATE, FERRITIN, TIBC, IRON, RETICCTPCT in the last 72 hours. Sepsis Labs: No results for input(s): PROCALCITON, LATICACIDVEN in the last 168 hours.  Recent Results (from the past 240 hour(s))  SARS CORONAVIRUS 2 (TAT 6-24 HRS) Nasopharyngeal Nasopharyngeal Swab     Status: None   Collection Time: 02/28/19  7:14 AM   Specimen: Nasopharyngeal Swab  Result Value Ref Range Status   SARS Coronavirus 2 NEGATIVE NEGATIVE Final    Comment: (NOTE) SARS-CoV-2 target nucleic acids are NOT DETECTED. The SARS-CoV-2 RNA is generally detectable in upper and lower respiratory specimens during the acute phase of infection. Negative results do not preclude SARS-CoV-2 infection, do not rule out co-infections with other pathogens, and should not be used as the sole basis for treatment or other patient management decisions. Negative results must be combined with clinical observations, patient history, and epidemiological information. The expected result is Negative. Fact Sheet for Patients: SugarRoll.be Fact Sheet for Healthcare Providers: https://www.woods-mathews.com/ This test is not yet approved or cleared by the [place] FDA and  has been authorized for detection and/or diagnosis of SARS-CoV-2 by FDA under an Emergency Use Authorization (EUA). This EUA will remain  in effect (meaning this test can be used) for the duration of the COVID-19 declaration under Section 56 4(b)(1) of the Act, 21 U.S.C. section 360bbb-3(b)(1), unless the authorization is terminated or revoked sooner. Performed at [facility], [place] [address], [place], [place]  [address]          Radiology Studies: Dg Chest Port 1 View  Result Date: 03/08/2019 CLINICAL DATA:  Pleural effusion. EXAM: PORTABLE CHEST 1 VIEW COMPARISON:  03/07/2019. FINDINGS: Right IJ line right chest tube in stable position. Very tiny right apical pneumothorax cannot be excluded. No interim change from prior exam. Postsurgical changes right lung. No acute infiltrate. No significant pleural effusion. Heart size stable. No acute bony abnormality IMPRESSION: 1. Right IJ line and right chest tube stable position. Very tiny right apical pneumothorax cannot be excluded. No interim change from prior exam. 2. Postsurgical changes right lung. Chest is stable from prior exam. Electronically Signed   By: Marcello Moores  Register   On: 03/08/2019 09:26   Dg Chest Port 1 View  Result Date: 03/07/2019 CLINICAL DATA:  Followup right lower lobectomy EXAM: PORTABLE CHEST 1 VIEW COMPARISON:  03/01/2019 FINDINGS: Right chest tube in place. Right internal jugular central line tip in the SVC above the right atrium. Tiny amount of pleural air at the apex. Lungs are well aerated. IMPRESSION: Status post  right lower lobectomy. Tiny amount of pleural air on the right. No unexpected finding. Electronically Signed   By: Nelson Chimes M.D.   On: 03/07/2019 12:39        Scheduled Meds: . acetaminophen  1,000 mg Oral Q6H   Or  . acetaminophen (TYLENOL) oral liquid 160 mg/5 mL  1,000 mg Oral Q6H  . amLODipine  5 mg Oral Daily  . aspirin EC  81 mg Oral Daily  . bisacodyl  10 mg Oral Daily  . bupivacaine liposome  20 mL Infiltration Once  . Chlorhexidine Gluconate Cloth  6 each Topical Daily  . docusate sodium  100 mg Oral Daily  . enoxaparin (LOVENOX) injection  30 mg Subcutaneous Q24H  . insulin aspart  0-24 Units Subcutaneous TID AC & HS  . living well with diabetes book   Does not apply Once  . pravastatin  40 mg Oral q1800  . senna-docusate  1 tablet Oral QHS   Continuous Infusions: . sodium chloride 20 mL/hr  at 03/07/19 1617     LOS: 6 days        Hosie Poisson, MD [facility] Pager [phone]  If 7PM-7AM, please contact night-coverage [email or web address] Password [facility] 03/08/2019, 3:37 PM

## 2019-03-08 NOTE — Progress Notes (Signed)
patient ambulated in hallway with walker. Gregory Lindsey, [employer]

## 2019-03-08 NOTE — Discharge Summary (Signed)
Physician Discharge Summary       [address]       [place] [address]             [phone]    Patient ID: Gregory Lindsey MRN: [id number] DOB/AGE: September 23, 1950 68 y.o.  Admit date: 03/02/2019 Discharge date: 03/09/2019  Admission Diagnoses: Right lower lobe lung mass  Discharge Diagnoses:  1. S/p bronchoscopy, right VATS, RLL 2.  3. Anemia 4. History of HTN (hypertension) 5. History of Diabetes ([place]) 6. Acute kidney injury (AKI) with acute tubular necrosis (ATN) (HCC) 7. History of dyslipidemia  Consults: Medicine  Procedure (s):  Bronchoscopy - Right Video assisted thoracoscopy - Right lower lobectomy - Mediastinal lymph node sampling - Intercostal nerve block by Dr. Kipp Brood on 03/07/2019  History of Presenting Illness: Gregory Lindsey 68 y.o. male who is well-known to the service for a right lower lobe pulmonary nodule.  He was scheduled to undergo right VATS right lower lobectomy today but was noted to have an elevated creatinine on yesterday's preoperative labs.  The patient was called yesterday evening and informed to hydrate and that we will recheck his labs but today there was another elevation to 2.6 and his creatinine.  Due to concern for ongoing acute injury his surgery has been postponed and he will undergo medical optimization.  He does admit that he has stopped taking his medication at home and has reported some systolic blood pressures in the 170s as well as blood sugars in the 200s and 300s.  Dr. Kipp Brood discussed the need for bronchoscopy, right VATS, right lower lobectomy, LN dissection, and intercostal nerve block. Potential risks, benefits, and complications of the surgery were discussed with the patient and he agreed to proceed with surgery. He underwent the aforementioned surgery on 09/22.   Brief Hospital Course:  The patient remained afebrile and hemodynamically stable. A line and foley were removed early in the post operative  course. Chest tube output gradually decreased and there was no air leak. Chest tube was continued to water seal for few days. Daily chest x rays were obtained and remained stable. Chest tube was removed on 03/09/2019.  Patient's creatinine post op went as high as 2.65 (creatinine upon admission was 2.41). Patient is ambulating on room air. Patient is tolerating a diet and has had a bowel movement. Wounds are clean and dry. Final chest X ray showed stable appearance of right sided pneumothorax.  Due to patient's elevated creatinine he will not be resumed on his home Metformin.  He is mildly hypertensive.  He is on Norvasc, however with elevated creatinine he will not be restarted on Lisinopril.  If his creatinine recovers he can resume in future follow up.  Medicine consult was obtained and recommended starting patient on Novolog 70/30 8 units twice per day.  He will require close follow up with primary care to ensure better control of his diabetes.  He is ambulating.  His incisions are healing without evidence of infection.  He is medically stable for discharge home today.  Patient is felt surgically stable for discharge today.  Latest Vital Signs: Blood pressure (!) 149/76, pulse 91, temperature 98.7 F (37.1 C), temperature source Oral, resp. rate 18, height _0  (1.778 m), weight (!) 138.7 kg, SpO2 96 %.  Physical Exam:  Cardiovascular: RRR Pulmonary: Clear to auscultation on left and diminshed right base Abdomen: Soft, non tender, bowel sounds present. Extremities: SCDs on LEs Wounds: Clean and dry.  No erythema or  signs of infection. Chest Tube: to water seal, no air leak  Discharge Condition: Stable and discharged to home.  Recent laboratory studies:  Lab Results  Component Value Date   WBC 16.4 (H) 03/09/2019   HGB 10.2 (L) 03/09/2019   HCT 31.1 (L) 03/09/2019   MCV 89.4 03/09/2019   PLT 246 03/09/2019   Lab Results  Component Value Date   NA 138 03/09/2019   K 4.1 03/09/2019    CL 102 03/09/2019   CO2 25 03/09/2019   CREATININE 1.79 (H) 03/09/2019   GLUCOSE 127 (H) 03/09/2019      Diagnostic Studies: Dg Chest 2 View  Result Date: 03/09/2019 CLINICAL DATA:  Pneumothorax EXAM: CHEST - 2 VIEW COMPARISON:  March 08, 2019 FINDINGS: Again seen is a tiny right apical pneumothorax. There is interval removal of the right-sided chest tube. The left lung remains clear. There is bibasilar dependent atelectasis. The cardiomediastinal silhouette is unchanged. A right-sided central venous catheter seen with the tip at the distal SVC. Elevation of the right hemithorax seen. IMPRESSION: Tiny right apical pneumothorax. Electronically Signed   By: Prudencio Pair M.D.   On: 03/09/2019 08:56   Dg Chest 2 View  Result Date: 03/02/2019 CLINICAL DATA:  Preoperative assessment for VATS procedure EXAM: CHEST - 2 VIEW COMPARISON:  Chest radiograph February 22, 2019 and PET-CT January 16, 2019 FINDINGS: No evident edema or consolidation. The area of increased opacity and abnormal radiotracer uptake in the right inferior perihilar region seen on PET study is not evident by radiography. Heart size and pulmonary vascular normal. No adenopathy. No bone lesions. IMPRESSION: No edema or consolidation. No pulmonary lesion is demonstrable by radiography currently. No adenopathy evident. Electronically Signed   By: Lowella Grip III M.D.   On: 03/02/2019 09:03   Dg Chest 2 View  Result Date: 02/22/2019 CLINICAL DATA:  Right lower lobe nodule EXAM: CHEST - 2 VIEW COMPARISON:  Chest CT 12/01/2018 FINDINGS: Normal heart size and mediastinal contours. No acute infiltrate or edema. No effusion or pneumothorax. No acute osseous findings. IMPRESSION: No evidence of active disease. Electronically Signed   By: Monte Fantasia M.D.   On: 02/22/2019 07:36   US Renal  Result Date: 03/02/2019 CLINICAL DATA:  Acute renal failure EXAM: RENAL / URINARY TRACT ULTRASOUND COMPLETE COMPARISON:  CT 12/01/2018 FINDINGS:  Right Kidney: Renal measurements: 10.8 by 4.4 x 4.8 cm = volume: 119.3 mL . Echogenicity within normal limits. No mass or hydronephrosis visualized. Left Kidney: Renal measurements: 10.2 x 5.2 x 5.7 cm = volume: 156.4 mL. Echogenicity within normal limits. No mass or hydronephrosis visualized. Bladder: Appears normal for degree of bladder distention. Enlarged prostate gland with mass effect on the posterior bladder. IMPRESSION: 1. Negative for hydronephrosis. Cortical echogenicity within normal limits 2. Enlarged prostate with mass effect on the bladder Electronically Signed   By: Donavan Foil M.D.   On: 03/02/2019 23:33   Dg Chest Port 1 View  Result Date: 03/09/2019 CLINICAL DATA:  Status post right-sided chest tube removal EXAM: PORTABLE CHEST 1 VIEW COMPARISON:  03/09/2019, 6:03 a.m. FINDINGS: No significant change in a small, less than 5% right apical pneumothorax. No acute airspace opacity on low volume AP portable examination. Interval removal of a right neck vascular catheter. IMPRESSION: 1. No significant change in a small, less than 5% right apical pneumothorax 2.  Interval removal of a right neck vascular catheter. Electronically Signed   By: Eddie Candle M.D.   On: 03/09/2019 12:45   Dg  Chest Port 1 View  Result Date: 03/08/2019 CLINICAL DATA:  Pleural effusion. EXAM: PORTABLE CHEST 1 VIEW COMPARISON:  03/07/2019. FINDINGS: Right IJ line right chest tube in stable position. Very tiny right apical pneumothorax cannot be excluded. No interim change from prior exam. Postsurgical changes right lung. No acute infiltrate. No significant pleural effusion. Heart size stable. No acute bony abnormality IMPRESSION: 1. Right IJ line and right chest tube stable position. Very tiny right apical pneumothorax cannot be excluded. No interim change from prior exam. 2. Postsurgical changes right lung. Chest is stable from prior exam. Electronically Signed   By: Marcello Moores  Register   On: 03/08/2019 09:26   Dg Chest  Port 1 View  Result Date: 03/07/2019 CLINICAL DATA:  Followup right lower lobectomy EXAM: PORTABLE CHEST 1 VIEW COMPARISON:  03/01/2019 FINDINGS: Right chest tube in place. Right internal jugular central line tip in the SVC above the right atrium. Tiny amount of pleural air at the apex. Lungs are well aerated. IMPRESSION: Status post right lower lobectomy. Tiny amount of pleural air on the right. No unexpected finding. Electronically Signed   By: Nelson Chimes M.D.   On: 03/07/2019 12:39   Dg C-arm Bronchoscopy  Result Date: 02/22/2019 C-ARM BRONCHOSCOPY: Fluoroscopy was utilized by the requesting physician.  No radiographic interpretation.    Discharge Instructions    Amb Referral to Nutrition and Diabetic E   Complete by: As directed    Ambulatory referral to Nutrition and Diabetic Education   Complete by: As directed       Discharge Medications: Allergies as of 03/09/2019   No Known Allergies     Medication List    STOP taking these medications   lisinopril 20 MG tablet Commonly known as: ZESTRIL   metFORMIN 500 MG tablet Commonly known as: GLUCOPHAGE     TAKE these medications   amLODipine 10 MG tablet Commonly known as: NORVASC Take 1 tablet (10 mg total) by mouth daily. Start taking on: March 10, 2019   aspirin 81 MG tablet Take 81 mg by mouth every evening.   blood glucose meter kit and supplies Kit Dispense based on patient and insurance preference. Use up to four times daily as directed. (FOR ICD-9 250.00, 250.01).   cyanocobalamin 2000 MCG tablet Take 2,000 mcg by mouth 2 (two) times daily.   insulin aspart protamine- aspart (70-30) 100 UNIT/ML injection Commonly known as: NOVOLOG MIX 70/30 Inject 0.08 mLs (8 Units total) into the skin 2 (two) times daily with a meal.   Iron Supplement 325 (65 FE) MG tablet Generic drug: ferrous sulfate Take 325 mg by mouth 2 (two) times daily.   multivitamin with minerals Tabs tablet Take 1 tablet by mouth daily.    pravastatin 40 MG tablet Commonly known as: PRAVACHOL Take 40 mg by mouth daily.   traMADol 50 MG tablet Commonly known as: ULTRAM Take 1-2 tablets (50-100 mg total) by mouth every 6 (six) hours as needed (mild pain).            Durable Medical Equipment  (From admission, onward)         Start     Ordered   03/09/19 1258  DME Glucometer  Once     03/09/19 1257          Follow Up Appointments: Follow-up Information    Lajuana Matte, MD. Go on 03/17/2019.   Specialty: Thoracic Surgery Why: Appointment time is at 12:45 pm Contact information: [place]  [place] [address] [phone]        Lemmie Evens, MD. Call.   Specialty: Family Medicine Why: for a follow up for further diabetes management and surveillance of HGA1C 9.9 as well as further management of hypertension.. Needs to be seen ASAP Contact information: [place] [address] [phone]        Medical Doctor Follow up.   Why: Please call for a follow up for further diabetes management and surveillance of HGA1C           Signed: Ellamae Sia 03/09/2019, 1:15 PM

## 2019-03-08 NOTE — Progress Notes (Signed)
Inpatient Diabetes Program Recommendations  AACE/ADA: New Consensus Statement on Inpatient Glycemic Control (2015)  Target Ranges:  Prepandial:   less than 140 mg/dL      Peak postprandial:   less than 180 mg/dL (1-2 hours)      Critically ill patients:  140 - 180 mg/dL   Lab Results  Component Value Date   GLUCAP 188 (H) 03/08/2019   HGBA1C 9.8 (H) 02/22/2019    Review of Glycemic Control  HgbA1C of 9.8%. On metformin 1000 mg bid at home. Will need tighter glucose control and HgbA1C < 8%. May need to go home on insulin.  Spoke with pt regarding his HgbA1C of 9.8%. Pt states he does not want to go on insulin, even if it helps his blood sugars. States he will f/u with PCP and discuss insulin with him. Pt states he does not have prescription coverage and says "I doubt I can afford any insulin." Discussed ReliOn brand insulin at [facility] for $25 (vial) and $44 (pen). Discussed importance of checking blood sugars and taking logbook to [facility] for MD to review and make recs. Discussed lifestyle modifications to assist with glycemic control. Pt appreciative of call and had no further questions. States he will make f/u appt with PCP after discharge.  Thank you. Lorenda Peck, RD, LDN, CDE Inpatient Diabetes Coordinator [phone]

## 2019-03-08 NOTE — Progress Notes (Signed)
Patient assist x1 to chair this AM.  Chest tube to water seal by MD this AM. Patient tolerated well. Will monitor patient. Denisia Harpole, Bettina Gavia RN

## 2019-03-08 NOTE — Progress Notes (Addendum)
      [address]       ,[place] [address]             [phone]       1 Day Post-Op Procedure(s) (LRB): VIDEO BRONCHOSCOPY (N/A) VIDEO ASSISTED THORACOSCOPY (VATS)/RIGHT LOWER LOBECTOMY (Right)  Subjective: Patient talking on the phone this am. He has some incisional pain.  Objective: Vital signs in last 24 hours: Temp:  [97.9 F (36.6 C)-98.6 F (37 C)] 98.2 F (36.8 C) (09/23 0738) Pulse Rate:  [76-89] 83 (09/23 0738) Cardiac Rhythm: Normal sinus rhythm (09/22 2050) Resp:  [11-24] 16 (09/23 0738) BP: (136-155)/(73-95) 145/91 (09/23 0738) SpO2:  [96 %-100 %] 99 % (09/23 0738) Arterial Line BP: (158-196)/(59-80) 196/80 (09/22 1245)      Intake/Output from previous day: 09/22 0701 - 09/23 0700 In: 1609.6 [I.V.:1509.6; IV Piggyback:100] Out: 2211 [Urine:1825; Blood:25; Chest Tube:361]   Physical Exam:  Cardiovascular: RRR Pulmonary: Clear to auscultation on left and diminshed right base Abdomen: Soft, non tender, bowel sounds present. Extremities: SCD on LLE and no RLE edema. Wounds: Clean and dry.  No erythema or signs of infection. Chest Tube: to water seal, no air leak  Lab Results: CBC: Recent Labs    03/08/19 0242  WBC 14.0*  HGB 9.6*  HCT 30.7*  PLT 236   BMET:  Recent Labs    03/06/19 0706 03/08/19 0242  NA 137 136  K 4.1 4.4  CL 104 104  CO2 22 23  GLUCOSE 191* 209*  BUN 15 15  CREATININE 1.63* 1.82*  CALCIUM 9.2 8.4*    PT/INR: No results for input(s): LABPROT, INR in the last 72 hours. ABG:  INR: Will add last result for INR, ABG once components are confirmed Will add last 4 CBG results once components are confirmed  Assessment/Plan:  1. CV - SR 80's. On Amlodipine 5 mg daily 2.  Pulmonary - On room air. Chest tube with 361 since surgery. Chest tube is to water seal and there is no air leak. CXR this am appears stable. Hope to remove chest tube soon. Encourage incentive spirometer. Await final pathology for  LN staging. 3. DM-CBGs . On Insulin. 4. Acute kidney injury with a history of stage III CKD-creatinine this am increased to 1.82. His creatinine was 2.41 on admission 5. Anemia-H and H this am 9.6 and 30.7  Donielle M ZimmermanPA-C 03/08/2019,8:01 AM [phone]  Agree with above Doing well.  No leak, and CXR shows lung well expanded Creat stable, encouraged PO intake Ambulation, and pulm toilet.  CT switched to [place]

## 2019-03-09 ENCOUNTER — Inpatient Hospital Stay (HOSPITAL_COMMUNITY): Payer: Medicare Other

## 2019-03-09 LAB — COMPREHENSIVE METABOLIC PANEL
ALT: 22 U/L (ref 0–44)
AST: 21 U/L (ref 15–41)
Albumin: 3.4 g/dL — ABNORMAL LOW (ref 3.5–5.0)
Alkaline Phosphatase: 85 U/L (ref 38–126)
Anion gap: 11 (ref 5–15)
BUN: 16 mg/dL (ref 8–23)
CO2: 25 mmol/L (ref 22–32)
Calcium: 9 mg/dL (ref 8.9–10.3)
Chloride: 102 mmol/L (ref 98–111)
Creatinine, Ser: 1.79 mg/dL — ABNORMAL HIGH (ref 0.61–1.24)
GFR calc Af Amer: 45 mL/min — ABNORMAL LOW (ref >60)
GFR calc non Af Amer: 39 mL/min — ABNORMAL LOW (ref >60)
Glucose, Bld: 127 mg/dL — ABNORMAL HIGH (ref 70–99)
Potassium: 4.1 mmol/L (ref 3.5–5.1)
Sodium: 138 mmol/L (ref 135–145)
Total Bilirubin: 0.7 mg/dL (ref 0.3–1.2)
Total Protein: 7.1 g/dL (ref 6.5–8.1)

## 2019-03-09 LAB — GLUCOSE, CAPILLARY
Glucose-Capillary: 160 mg/dL — ABNORMAL HIGH (ref 70–99)
Glucose-Capillary: 169 mg/dL — ABNORMAL HIGH (ref 70–99)

## 2019-03-09 LAB — SURGICAL PATHOLOGY

## 2019-03-09 LAB — CBC
HCT: 31.1 % — ABNORMAL LOW (ref 39.0–52.0)
Hemoglobin: 10.2 g/dL — ABNORMAL LOW (ref 13.0–17.0)
MCH: 29.3 pg (ref 26.0–34.0)
MCHC: 32.8 g/dL (ref 30.0–36.0)
MCV: 89.4 fL (ref 80.0–100.0)
Platelets: 246 10*3/uL (ref 150–400)
RBC: 3.48 MIL/uL — ABNORMAL LOW (ref 4.22–5.81)
RDW: 12.9 % (ref 11.5–15.5)
WBC: 16.4 10*3/uL — ABNORMAL HIGH (ref 4.0–10.5)
nRBC: 0 % (ref 0.0–0.2)

## 2019-03-09 MED ORDER — INSULIN ASPART PROT & ASPART (70-30 MIX) 100 UNIT/ML ~~LOC~~ SUSP: 8.0000 [IU] | Freq: Two times a day (BID) | SUBCUTANEOUS | 11 refills | Status: AC

## 2019-03-09 MED ORDER — AMLODIPINE BESYLATE 10 MG PO TABS: 10.0000 mg | ORAL_TABLET | Freq: Every day | ORAL | 3 refills | Status: DC

## 2019-03-09 MED ORDER — AMLODIPINE BESYLATE 10 MG PO TABS
10.0000 mg | ORAL_TABLET | Freq: Every day | ORAL | Status: DC
  Administered 2019-03-09: 08:00:00 10 mg via ORAL
  Filled 2019-03-09: qty 1

## 2019-03-09 MED ORDER — INSULIN ASPART PROT & ASPART (70-30 MIX) 100 UNIT/ML ~~LOC~~ SUSP
8.0000 [IU] | Freq: Two times a day (BID) | SUBCUTANEOUS | Status: DC
  Filled 2019-03-09: qty 10

## 2019-03-09 MED ORDER — TRAMADOL HCL 50 MG PO TABS: 50.0000 mg | ORAL_TABLET | Freq: Four times a day (QID) | ORAL | 0 refills | Status: DC | PRN

## 2019-03-09 MED ORDER — BLOOD GLUCOSE MONITOR KIT: PACK | 0 refills | Status: AC

## 2019-03-09 MED ORDER — INSULIN NPH (HUMAN) (ISOPHANE) 100 UNIT/ML ~~LOC~~ SUSP: 20.0000 [IU] | Freq: Every day | SUBCUTANEOUS | 3 refills | Status: DC

## 2019-03-09 MED ORDER — LISINOPRIL 10 MG PO TABS: 10.0000 mg | ORAL_TABLET | Freq: Every day | ORAL | Status: DC

## 2019-03-09 NOTE — Progress Notes (Signed)
Chest tube removed by Dr. Kipp Brood this AM. Per MD chest xray is to be done at 12:00 today post removal. Will monitor patient.Ryonna Cimini, Bettina Gavia RN

## 2019-03-09 NOTE — Progress Notes (Addendum)
      [address]       [place] [address]             [phone]       2 Days Post-Op Procedure(s) (LRB): VIDEO BRONCHOSCOPY (N/A) VIDEO ASSISTED THORACOSCOPY (VATS)/RIGHT LOWER LOBECTOMY (Right)  Subjective: Patient hopes to go home soon. He is passing flatus but no bowel movement yet.  Objective: Vital signs in last 24 hours: Temp:  [98.2 F (36.8 C)-99.3 F (37.4 C)] 99.1 F (37.3 C) (09/24 0349) Pulse Rate:  [83-105] 95 (09/24 0349) Cardiac Rhythm: Normal sinus rhythm;Sinus tachycardia (09/24 0320) Resp:  [16-29] 20 (09/24 0349) BP: (124-158)/(63-91) 155/74 (09/24 0349) SpO2:  [96 %-100 %] 96 % (09/24 0349) Weight:  [138.7 kg] 138.7 kg (09/24 0349)      Intake/Output from previous day: 09/23 0701 - 09/24 0700 In: 480 [P.O.:480] Out: 982 [Urine:752; Chest Tube:230]   Physical Exam:  Cardiovascular: RRR Pulmonary: Clear to auscultation on left and diminshed right base Abdomen: Soft, non tender, bowel sounds present. Extremities: SCDs on LEs Wounds: Clean and dry.  No erythema or signs of infection. Chest Tube: to water seal, no air leak  Lab Results: CBC: Recent Labs    03/08/19 0242 03/09/19 0337  WBC 14.0* 16.4*  HGB 9.6* 10.2*  HCT 30.7* 31.1*  PLT 236 246   BMET:  Recent Labs    03/08/19 0242 03/09/19 0337  NA 136 138  K 4.4 4.1  CL 104 102  CO2 23 25  GLUCOSE 209* 127*  BUN 15 16  CREATININE 1.82* 1.79*  CALCIUM 8.4* 9.0    PT/INR: No results for input(s): LABPROT, INR in the last 72 hours. ABG:  INR: Will add last result for INR, ABG once components are confirmed Will add last 4 CBG results once components are confirmed  Assessment/Plan:  1. CV - SR 90's and hypertensive. On Amlodipine 5 mg daily. Will increase Amlodipine but will not restart Lisinopril secondary to elevated creatinine. 2.  Pulmonary - On room air. Chest tube with 230 since surgery. Chest tube is to water seal and there is no air leak. CXR  this am appears stable. Likely remove chest tube. Encourage incentive spirometer. Await final pathology for LN staging. 3. DM-CBGs 241/231/160 . On Insulin. As with elevated HGA1C 9.9, he would benefit from Insulin if he is willing;per medicine 4. Acute kidney injury with a history of stage III CKD-creatinine this am slightly decreased to 1.79. His creatinine was 2.41 on admission 5. Anemia-H and H this am 10.2 and 31.1 6. Remove central line 7.Will obtain post chest tube removal CXR and if stable, possible discharge later today  Trinnity Breunig M ZimmermanPA-C 03/09/2019,7:06 AM [phone]

## 2019-03-09 NOTE — Progress Notes (Signed)
PROGRESS NOTE    Gregory Lindsey  BSW:[id number] DOB: Jun 27, 1950 DOA: 03/02/2019 PCP: Lemmie Evens, MD    Brief Narrative:  68 year old gentleman with prior history of hypertension, hyperlipidemia, diabetes mellitus was found to have a right lower lobe pulmonary nodule status post bronchoscopy and the biopsy results were nondiagnostic he was brought in for elective lobectomy and and found to have AKI.  Medicine consulted for management evaluation of AKI.  Patient underwent bronchoscopy and VATS with right lower lobectomy. Assessment & Plan:   Active Problems:   HTN (hypertension)   Diabetes ([place])   Right lower lobe lung mass   Acute kidney injury (AKI) with acute tubular necrosis (ATN) ([facility])   Dyslipidemia   AKI  on stage III CKD Baseline creatinine is about 1.7 admitted with a creatinine of 2.4. Patient's creatinines ranging between 1.6-1.8 at this time. Recommend outpatient follow up with nephrology for stage 3 CKD.  Recheck renal parameters in one week.     Type 2 diabetes mellitus CBG (last 3)  Recent Labs    03/08/19 1729 03/08/19 2107 03/09/19 0612  GLUCAP 241* 231* 160*   Patient on sliding scale insulin while inpatient. On metformin at home which has been discontinued due to renal function.   patient's hemoglobin A1c is 9.9. optimally will need to start the patient on long acting insulin regimen. Discussed in detail about starting Lantus or Levemir , unfortunately he does not have prescription coverage and will need to pay out of pocket. He can be started on novolog 70/30 8 units BID ( which will be less than 50 dollars per month) and follow up with PCP in less than a week.    Hypertension:  Better controlled today. Please restart lisinopril 10 mg daily on discharge in addition to norvasc.    Hyperlipidemia:  Resume pravachol.    Anemia of chronic disease:  Hemoglobin is stable around 10.    Leukocytosis:  Persistent.  Low grade temp of 99.3  overnight, but he reports breathing better, clinically does not appear toxic.  Monitor.    Subjective: No new complaints.   Objective: Vitals:   03/08/19 2120 03/09/19 0055 03/09/19 0349 03/09/19 0811  BP:  (!) 143/75 (!) 155/74 (!) 143/74  Pulse:  92 95 97  Resp: (!) 22 18 20 17   Temp:  99.3 F (37.4 C) 99.1 F (37.3 C)   TempSrc:  Oral Oral   SpO2:  96% 96% 96%  Weight:   (!) 138.7 kg   Height:        Intake/Output Summary (Last 24 hours) at 03/09/2019 1159 Last data filed at 03/09/2019 0951 Gross per 24 hour  Intake 480 ml  Output 982 ml  Net -502 ml   Filed Weights   03/02/19 0549 03/02/19 1400 03/09/19 0349  Weight: (!) 137 kg 134.9 kg (!) 138.7 kg    Examination:  General exam: comfortable, not in distress.  Respiratory system: chest tube taken out. Air entry fiar.   Cardiovascular system: s1s2. RRR, no JVD.  Gastrointestinal system: abd is soft, NT ND BS+ Central nervous system:ALERT and oriented. Non focal  Extremities: no cyanosis or clubbing.  Skin: no rashes.  Psychiatry:  Mood appropriate.      Data Reviewed: I have personally reviewed following labs and imaging studies  CBC: Recent Labs  Lab 03/03/19 0230 03/08/19 0242 03/09/19 0337  WBC 8.3 14.0* 16.4*  HGB 10.0* 9.6* 10.2*  HCT 31.6* 30.7* 31.1*  MCV 89.3 91.1 89.4  PLT 202  236 009   Basic Metabolic Panel: Recent Labs  Lab 03/03/19 0230 03/04/19 0333 03/06/19 0706 03/08/19 0242 03/09/19 0337  NA 136 137 137 136 138  K 4.1 4.0 4.1 4.4 4.1  CL 105 104 104 104 102  CO2 22 22 22 23 25   GLUCOSE 125* 121* 191* 209* 127*  BUN 28* 23 15 15 16   CREATININE 1.97* 1.77* 1.63* 1.82* 1.79*  CALCIUM 8.8* 8.9 9.2 8.4* 9.0   GFR: Estimated Creatinine Clearance: 57 mL/min (A) (by C-G formula based on SCr of 1.79 mg/dL (H)). Liver Function Tests: Recent Labs  Lab 03/09/19 0337  AST 21  ALT 22  ALKPHOS 85  BILITOT 0.7  PROT 7.1  ALBUMIN 3.4*   No results for input(s): LIPASE, AMYLASE  in the last 168 hours. No results for input(s): AMMONIA in the last 168 hours. Coagulation Profile: No results for input(s): INR, PROTIME in the last 168 hours. Cardiac Enzymes: No results for input(s): CKTOTAL, CKMB, CKMBINDEX, TROPONINI in the last 168 hours. BNP (last 3 results) No results for input(s): PROBNP in the last 8760 hours. HbA1C: No results for input(s): HGBA1C in the last 72 hours. CBG: Recent Labs  Lab 03/08/19 0636 03/08/19 1142 03/08/19 1729 03/08/19 2107 03/09/19 0612  GLUCAP 175* 188* 241* 231* 160*   Lipid Profile: No results for input(s): CHOL, HDL, LDLCALC, TRIG, CHOLHDL, LDLDIRECT in the last 72 hours. Thyroid Function Tests: No results for input(s): TSH, T4TOTAL, FREET4, T3FREE, THYROIDAB in the last 72 hours. Anemia Panel: No results for input(s): VITAMINB12, FOLATE, FERRITIN, TIBC, IRON, RETICCTPCT in the last 72 hours. Sepsis Labs: No results for input(s): PROCALCITON, LATICACIDVEN in the last 168 hours.  Recent Results (from the past 240 hour(s))  SARS CORONAVIRUS 2 (TAT 6-24 HRS) Nasopharyngeal Nasopharyngeal Swab     Status: None   Collection Time: 02/28/19  7:14 AM   Specimen: Nasopharyngeal Swab  Result Value Ref Range Status   SARS Coronavirus 2 NEGATIVE NEGATIVE Final    Comment: (NOTE) SARS-CoV-2 target nucleic acids are NOT DETECTED. The SARS-CoV-2 RNA is generally detectable in upper and lower respiratory specimens during the acute phase of infection. Negative results do not preclude SARS-CoV-2 infection, do not rule out co-infections with other pathogens, and should not be used as the sole basis for treatment or other patient management decisions. Negative results must be combined with clinical observations, patient history, and epidemiological information. The expected result is Negative. Fact Sheet for Patients: SugarRoll.be Fact Sheet for Healthcare Providers:  https://www.woods-mathews.com/ This test is not yet approved or cleared by the [place] FDA and  has been authorized for detection and/or diagnosis of SARS-CoV-2 by FDA under an Emergency Use Authorization (EUA). This EUA will remain  in effect (meaning this test can be used) for the duration of the COVID-19 declaration under Section 56 4(b)(1) of the Act, 21 U.S.C. section 360bbb-3(b)(1), unless the authorization is terminated or revoked sooner. Performed at [facility], [place] [address] [place], [place] [address]          Radiology Studies: Dg Chest 2 View  Result Date: 03/09/2019 CLINICAL DATA:  Pneumothorax EXAM: CHEST - 2 VIEW COMPARISON:  March 08, 2019 FINDINGS: Again seen is a tiny right apical pneumothorax. There is interval removal of the right-sided chest tube. The left lung remains clear. There is bibasilar dependent atelectasis. The cardiomediastinal silhouette is unchanged. A right-sided central venous catheter seen with the tip at the distal SVC. Elevation of the right hemithorax seen. IMPRESSION:  Tiny right apical pneumothorax. Electronically Signed   By: Prudencio Pair M.D.   On: 03/09/2019 08:56   Dg Chest Port 1 View  Result Date: 03/08/2019 CLINICAL DATA:  Pleural effusion. EXAM: PORTABLE CHEST 1 VIEW COMPARISON:  03/07/2019. FINDINGS: Right IJ line right chest tube in stable position. Very tiny right apical pneumothorax cannot be excluded. No interim change from prior exam. Postsurgical changes right lung. No acute infiltrate. No significant pleural effusion. Heart size stable. No acute bony abnormality IMPRESSION: 1. Right IJ line and right chest tube stable position. Very tiny right apical pneumothorax cannot be excluded. No interim change from prior exam. 2. Postsurgical changes right lung. Chest is stable from prior exam. Electronically Signed   By: Marcello Moores  Register   On: 03/08/2019 09:26   Dg Chest Port 1 View  Result Date: 03/07/2019  CLINICAL DATA:  Followup right lower lobectomy EXAM: PORTABLE CHEST 1 VIEW COMPARISON:  03/01/2019 FINDINGS: Right chest tube in place. Right internal jugular central line tip in the SVC above the right atrium. Tiny amount of pleural air at the apex. Lungs are well aerated. IMPRESSION: Status post right lower lobectomy. Tiny amount of pleural air on the right. No unexpected finding. Electronically Signed   By: Nelson Chimes M.D.   On: 03/07/2019 12:39        Scheduled Meds: . acetaminophen  1,000 mg Oral Q6H   Or  . acetaminophen (TYLENOL) oral liquid 160 mg/5 mL  1,000 mg Oral Q6H  . amLODipine  10 mg Oral Daily  . aspirin EC  81 mg Oral Daily  . bisacodyl  10 mg Oral Daily  . bupivacaine liposome  20 mL Infiltration Once  . Chlorhexidine Gluconate Cloth  6 each Topical Daily  . docusate sodium  100 mg Oral Daily  . enoxaparin (LOVENOX) injection  30 mg Subcutaneous Q24H  . insulin aspart  0-24 Units Subcutaneous TID AC & HS  . living well with diabetes book   Does not apply Once  . pravastatin  40 mg Oral q1800  . senna-docusate  1 tablet Oral QHS   Continuous Infusions: . sodium chloride 20 mL/hr at 03/07/19 1617     LOS: 7 days        Hosie Poisson, MD [facility] Pager [phone]  If 7PM-7AM, please contact night-coverage [email or web address] Password [email or web address] 03/09/2019, 11:59 AM

## 2019-03-09 NOTE — Progress Notes (Signed)
Patient given discharge instructions medication list and prescriptons sent to personal pharmacy. Follow up appointments also given. All questions answered. IV and tele dcd. Will discharge home as ordered. Keziah Avis, Bettina Gavia RN

## 2019-03-09 NOTE — Progress Notes (Signed)
Central line DCd as ordered, and per protocol, tip intact. Patient reminded to lie supine approximately 30 minutes. Pressure held and dressing applied. Will monitor patient. Kaheem Halleck, Bettina Gavia RN

## 2019-03-09 NOTE — Discharge Instructions (Signed)
Discharge Instructions:  PLEASE KEEP A LOG OF YOUR BLOOD SUGARS THREE TIMES PER DAY WITH MEALS AND PRIOR TO BEDTIME  1. You may shower, please wash incisions daily with soap and water and keep dry.  If you wish to cover wounds with dressing you may do so but please keep clean and change daily.  No tub baths or swimming until incisions have completely healed.  If your incisions become red or develop any drainage please call our office at [phone]  2. No Driving until cleared by Dr. Abran Duke office and you are no longer using narcotic pain medications  3. Fever of 101.5 for at least 24 hours with no source, please contact our office at [phone]  4. Activity- up as tolerated, please walk at least 3 times per day.  Avoid strenuous activity, no lifting, pushing, or pulling with your arms over 8-10 lbs for a minimum of 6 weeks  6. If any questions or concerns arise, please do not hesitate to contact our office at [phone]  Thoracoscopy, Care After This sheet gives you information about how to care for yourself after your procedure. Your health care provider may also give you more specific instructions. If you have problems or questions, contact your health care provider. What can I expect after the procedure? After the procedure, it is common to have pain and soreness in the surgical area. Follow these instructions at home: Incision care   Follow instructions from your health care provider about how to take care of your incision. Make sure you: ? Wash your hands with soap and water before you change your bandage (dressing). If soap and water are not available, use hand sanitizer. ? Change your dressing as told by your health care provider. ? Leave stitches (sutures), skin glue, or adhesive strips in place. These skin closures may need to stay in place for 2 weeks or longer. If adhesive strip edges start to loosen and curl up, you may trim the loose edges. Do not remove adhesive  strips completely unless your health care provider tells you to do that.  Check your incision areas every day for signs of infection. Check for: ? Redness, swelling, or pain. ? Fluid or blood. ? Warmth. ? Pus or a bad smell.  Do not take baths, swim, or use a hot tub until your health care provider approves. You may take showers. Medicines  Take over-the-counter and prescription medicines only as told by your health care provider.  If you were prescribed an antibiotic medicine, take it as told by your health care provider. Do not stop taking the antibiotic even if you start to feel better.  Do not drive or use heavy machinery while taking prescription pain medicine.  If you are taking prescription pain medicine, take actions to prevent or treat constipation. Your health care provider may recommend that you: ? Drink enough fluid to keep your urine pale yellow. ? Eat foods that are high in fiber, such as fresh fruits and vegetables, whole grains, and beans. ? Limit foods that are high in fat and processed sugars, such as fried and sweet foods. ? Take an over-the-counter or prescription medicine for constipation. Managing pain, stiffness, and swelling   If directed, put ice on the affected area: ? Put ice in a plastic bag. ? Place a towel between your skin and the bag. ? Leave the ice on for 20 minutes, 2-3 times a day. Preventing lung infection  To prevent pneumonia and to keep your lungs healthy: ?  Try to cough often. If it hurts to cough, hold a pillow against your chest as you cough. ? Take deep breaths or do breathing exercises as instructed by your health care provider. ? If you were given an incentive spirometer, use it as directed by your health care provider. General instructions  Do not lift anything that is heavier than 10 lb (4.5 kg), or the limit that you are told, until your health care provider says that it is safe.  Do not use any products that contain nicotine or  tobacco, such as cigarettes and e-cigarettes. These can delay healing after surgery. If you need help quitting, ask your health care provider.  Avoid driving until your health care provider approves.  If you have a chest drainage tube, care for it as instructed by your health care provider. Do not travel by airplane after the chest drainage tube is removed until your health care provider approves.  Keep all follow-up visits as told by your health care provider. This is important. Contact a health care provider if:  You have a fever.  Pain medicines do not ease your pain.  You have redness, swelling, or increasing pain in your incision area.  You develop a cough that does not go away, or you are coughing up mucus that is yellow or green. Get help right away if:  You have fluid, blood, or pus coming from your incision.  There is a bad smell coming from your incision or dressing.  You develop a rash.  You cough up blood.  You develop light-headedness, or you feel faint.  You have difficulty breathing.  You develop chest pain.  Your heartbeat feels irregular or very fast. These symptoms may represent a serious problem that is an emergency. Do not wait to see if the symptoms will go away. Get medical help right away. Call your local emergency services (911 in the [place] Do not drive yourself to the hospital. Summary  Follow instructions from your health care provider about how to take care of your incision.  Do not drive or use heavy machinery while taking prescription pain medicine.  Leave stitches (sutures), skin glue, or adhesive strips in place.  Check your incision areas every day for signs of infection. This information is not intended to replace advice given to you by your health care provider. Make sure you discuss any questions you have with your health care provider. Document Released: 12/19/2004 Document Revised: 05/14/2017 Document Reviewed: 05/11/2017 Elsevier  Patient Education  2020 [place] With Diabetes  Why Is Carbohydrate Counting Important?  Counting carbohydrate servings may help you control your blood glucose level so that you feel better.   The balance between the carbohydrates you eat and insulin determines what your blood glucose level will be after eating.   Carbohydrate counting can also help you plan your meals. Which Foods Have Carbohydrates? Foods with carbohydrates include:  Breads, crackers, and cereals   Pasta, rice, and grains   Starchy vegetables, such as potatoes, corn, and peas   Beans and legumes   Milk, soy milk, and yogurt   Fruits and fruit juices   Sweets, such as cakes, cookies, ice cream, jam, and jelly Carbohydrate Servings In diabetes meal planning, 1 serving of a food with carbohydrate has about 15 grams of carbohydrate:  Check serving sizes with measuring cups and spoons or a food scale.   Read the Nutrition Facts on food labels to find out how many grams  of carbohydrate are in foods you eat. The food lists in this handout show portions that have about 15 grams of carbohydrate.  Tips Meal Planning Tips  An Eating Plan tells you how many carbohydrate servings to eat at your meals and snacks. For many adults, eating 3 to 5 servings of carbohydrate foods at each meal and 1 or 2 carbohydrate servings for each snack works well.   In a healthy daily Eating Plan, most carbohydrates come from:   At least 6 servings of fruits and nonstarchy vegetables   At least 6 servings of grains, beans, and starchy vegetables, with at least 3 servings from whole grains   At least 2 servings of milk or milk products  Check your blood glucose level regularly. It can tell you if you need to adjust when you eat carbohydrates.   Eating foods that have fiber, such as whole grains, and having very few salty foods is good for your health.   Eat 4 to 6 ounces of meat or other  protein foods (such as soybean burgers) each day. Choose low-fat sources of protein, such as lean beef, lean pork, chicken, fish, low-fat cheese, or vegetarian foods such as soy.   Eat some healthy fats, such as olive oil, canola oil, and nuts.   Eat very little saturated fats. These unhealthy fats are found in butter, cream, and high-fat meats, such as bacon and sausage.   Eat very little or no trans fats. These unhealthy fats are found in all foods that list partially hydrogenated oil as an ingredient.  Label Reading Tips The Nutrition Facts panel on a label lists the grams of total carbohydrate in 1 standard serving. The label's standard serving may be larger or smaller than 1 carbohydrate serving. To figure out how many carbohydrate servings are in the food:  First, look at the label's standard serving size.   Check the grams of total carbohydrate. This is the amount of carbohydrate in 1 standard serving.   Divide the grams of total carbohydrate by 15. This number equals the number of carbohydrate servings in 1 standard serving. Remember: 1 carbohydrate serving is 15 grams of carbohydrate.   Note: You may ignore the grams of sugars on the Nutrition Facts panel because they are included in the grams of total carbohydrate.  Foods Recommended 1 serving = about 15 grams of carbohydrate Starches  1 slice bread (1 ounce)   1 tortilla (6-inch size)    large bagel (1 ounce)   2 taco shells (5-inch size)    hamburger or hot dog bun ( ounce)    cup ready-to-eat unsweetened cereal    cup cooked cereal   1 cup broth-based soup   4 to 6 small crackers   1/3 cup pasta or rice (cooked)    cup beans, peas, corn, sweet potatoes, winter squash, or mashed or boiled potatoes (cooked)    large baked potato (3 ounces)    ounce pretzels, potato chips, or tortilla chips   3 cups popcorn (popped) Fruit  1 small fresh fruit ( to 1 cup)    cup canned or frozen fruit   2  tablespoons dried fruit (blueberries, cherries, cranberries, mixed fruit, raisins)   17 small grapes (3 ounces)   1 cup melon or berries    cup unsweetened fruit juice Milk  1 cup fat-free or reduced-fat milk   1 cup soy milk   2/3 cup (6 ounces) nonfat yogurt sweetened with sugar-free sweetener Sweets and Desserts  2-inch square cake (unfrosted)   2 small cookies (2/3 ounce)    cup ice cream or frozen yogurt    cup sherbet or sorbet   1 tablespoon syrup, jam, jelly, table sugar, or honey   2 tablespoons light syrup Other Foods  Count 1 cup raw vegetables or  cup cooked nonstarchy vegetables as zero (0) carbohydrate servings or free foods. If you eat 3 or more servings at one meal, count them as 1 carbohydrate serving.   Foods that have less than 20 calories in each serving also may be counted as zero carbohydrate servings or free foods.   Count 1 cup of casserole or other mixed foods as 2 carbohydrate servings.  Carbohydrate Counting for People with Diabetes Sample 1-Day Menu  Breakfast 1 extra-small banana (1 carbohydrate serving)  1 cup low-fat or fat-free milk (1 carbohydrate serving)  1 slice whole wheat bread (1 carbohydrate serving)  1 teaspoon margarine  Lunch 2 ounces [place] slices  2 slices whole wheat bread (2 carbohydrate servings)  2 lettuce leaves  4 celery sticks  4 carrot sticks  1 medium apple (1 carbohydrate serving)  1 cup low-fat or fat-free milk (1 carbohydrate serving)  Afternoon Snack 2 tablespoons raisins (1 carbohydrate serving)  3/4 ounce unsalted mini pretzels (1 carbohydrate serving)  Evening Meal 3 ounces lean roast beef  1/2 large baked potato (2 carbohydrate servings)  1 tablespoon reduced-fat sour cream  1/2 cup green beans  1 tablespoon light salad dressing  1 whole wheat dinner roll (1 carbohydrate serving)  1 teaspoon margarine  1 cup melon balls (1 carbohydrate serving)  Evening Snack 2 tablespoons unsalted nuts    Carbohydrate Counting for People with Diabetes Vegan Sample 1-Day Menu  Breakfast 1 cup cooked oatmeal (2 carbohydrate servings)   cup blueberries (1 carbohydrate serving)  2 tablespoons flaxseeds  1 cup soymilk fortified with calcium and vitamin D  1 cup coffee  Lunch 2 slices whole wheat bread (2 carbohydrate servings)   cup baked tofu   cup lettuce  2 slices tomato  2 slices avocado   cup baby carrots  1 orange (1 carbohydrate serving)  1 cup soymilk fortified with calcium and vitamin D   Evening Meal Burrito made with: 1 6-inch corn tortilla (1 carbohydrate serving)  1 cup refried vegetarian beans (1 carbohydrate serving)   cup chopped tomatoes   cup lettuce   cup salsa  1/3 cup brown rice (1 carbohydrate serving)  1 tablespoon olive oil for rice   cup zucchini   Evening Snack 6 small whole grain crackers (1 carbohydrate serving)  2 apricots ( carbohydrate serving)   cup unsalted peanuts ( carbohydrate serving)     Carbohydrate Counting for People with Diabetes Vegetarian (Lacto-Ovo) Sample 1-Day Menu  Breakfast 1 cup cooked oatmeal (2 carbohydrate servings)   cup blueberries (1 carbohydrate serving)  2 tablespoons flaxseeds  1 egg  1 cup 1% milk (1 carbohydrate serving)  1 cup coffee  Lunch 2 slices whole wheat bread (2 carbohydrate servings)  2 ounces low-fat cheese   cup lettuce  2 slices tomato  2 slices avocado   cup baby carrots  1 orange (1 carbohydrate serving)  1 cup unsweetened tea  Evening Meal Burrito made with: 1 6-inch corn tortilla (1 carbohydrate serving)   cup refried vegetarian beans (1 carbohydrate serving)   cup tomatoes   cup lettuce   cup salsa  1/3 cup brown rice (1 carbohydrate serving)  1 tablespoon olive oil  for rice   cup zucchini  1 cup 1% milk (1 carbohydrate serving)  Evening Snack 6 small whole grain crackers (1 carbohydrate serving)  2 apricots ( carbohydrate serving)   cup unsalted peanuts (  carbohydrate serving)    Copyright 2020  [employer]y [employer]. All rights reserved.  Using Nutrition Labels: Carbohydrate   Serving Size   Look at the serving size. All the information on the label is based on this portion.  Servings Per Container   The number of servings contained in the package.  Guidelines for Carbohydrate   Look at the total grams of carbohydrate in the serving size.   1 carbohydrate choice = 15 grams of carbohydrate. Range of Carbohydrate Grams Per Choice  Carbohydrate Grams/Choice Carbohydrate Choices  6-10   11-20 1  21-25 1  26-35 2  36-40 2  41-50 3  51-55 3  56-65 4  66-70 4  71-80 5    Copyright 2020  Academy of [employer]etics. All rights reserved.

## 2019-03-09 NOTE — Care Management Important Message (Signed)
Important Message  Patient Details  Name: SUBHAN HOOPES MRN: [id number] Date of Birth: 30-Jun-1950   Medicare Important Message Given:  Yes     Shelda Altes 03/09/2019, 3:18 PM

## 2019-03-09 NOTE — Progress Notes (Signed)
Spoke with pt about drug coverage for medications (insulin)- per pt he does not have any prescription drug coverage (no part D)- he has been paying out of pocket for his meds. He will need to have low cost generic medications for discharge.

## 2019-03-09 NOTE — Progress Notes (Signed)
Patient with return demonstration on insulin administration. All questions answered. Myeesha Shane, Bettina Gavia RN

## 2019-03-15 ENCOUNTER — Other Ambulatory Visit: Payer: Self-pay | Admitting: Thoracic Surgery (Cardiothoracic Vascular Surgery)

## 2019-03-15 DIAGNOSIS — R918 Other nonspecific abnormal finding of lung field: Secondary | ICD-10-CM

## 2019-03-16 ENCOUNTER — Other Ambulatory Visit: Payer: Self-pay | Admitting: *Deleted

## 2019-03-16 NOTE — Progress Notes (Signed)
The proposed treatment discussed in cancer conference 03/16/19 is for discussion purpose only and is not a binding recommendation.  The patient was not physically examined nor present for their treatment options.  Therefore, final treatment plans cannot be decided.

## 2019-03-17 ENCOUNTER — Other Ambulatory Visit: Payer: Self-pay

## 2019-03-17 ENCOUNTER — Ambulatory Visit (INDEPENDENT_AMBULATORY_CARE_PROVIDER_SITE_OTHER): Payer: Self-pay | Admitting: Thoracic Surgery (Cardiothoracic Vascular Surgery)

## 2019-03-17 ENCOUNTER — Encounter: Payer: Self-pay | Admitting: Thoracic Surgery (Cardiothoracic Vascular Surgery)

## 2019-03-17 ENCOUNTER — Ambulatory Visit
Admission: RE | Admit: 2019-03-17 | Discharge: 2019-03-17 | Disposition: A | Discharge status: Home / Self Care | Payer: Medicare Other | Source: Ambulatory Visit | Attending: Thoracic Surgery (Cardiothoracic Vascular Surgery) | Admitting: Thoracic Surgery (Cardiothoracic Vascular Surgery)

## 2019-03-17 VITALS — BP 146/77 | HR 100 | Temp 97.7°F | Resp 20 | Ht 70.0 in | Wt 297.0 lb

## 2019-03-17 DIAGNOSIS — Z902 Acquired absence of lung [part of]: Secondary | ICD-10-CM

## 2019-03-17 DIAGNOSIS — R918 Other nonspecific abnormal finding of lung field: Secondary | ICD-10-CM

## 2019-03-17 DIAGNOSIS — Z09 Encounter for follow-up examination after completed treatment for conditions other than malignant neoplasm: Secondary | ICD-10-CM

## 2019-03-17 DIAGNOSIS — R0602 Shortness of breath: Secondary | ICD-10-CM | POA: Diagnosis not present

## 2019-03-17 NOTE — Progress Notes (Signed)
      [address]       [place] [address]             [phone]        Maston W Gosa [place] Medical Record #[id number] Date of Birth: [birth year]  Referring: Derek Jack, MD Primary Care: Lemmie Evens, MD Primary Cardiologist:No primary care provider on file.  Reason for visit:   follow-up  History of Present Illness:     No complaints.  Doing well.  Ambulating more  Physical Exam: BP (!) 146/77   Pulse 100   Temp 97.7 F (36.5 C)   Resp 20   Ht 5\' 10"  (1.778 m)   Wt 297 lb (134.7 kg)   SpO2 95% Comment: RA  BMI 42.62 kg/m   Alert NAD RRR, no murmur.  Incision.  Superior incision healing well.  CT site, and access incision under a skin fold, so slightly most .  Abdomen soft, NT/ND no peripheral edema   Diagnostic Studies & Laboratory data: CXR: right effusion Path:  DIAGNOSIS:   A. LUNG, RIGHT LOWER LOBE, LOBECTOMY:  - Invasive adenocarcinoma, 2.3 cm.  - No visceral pleura invasion identified.  - Margins of resection are not involved.  - See oncology table.   B. LYMPH NODE, LEVEL 11, BIOPSY:  - One lymph node, negative for carcinoma (0/1).   C. LYMPH NODE, LEVEL 11 #2, BIOPSY:  - One lymph node, negative for carcinoma (0/1).   D. LYMPH NODE, LEVEL 12 #1, BIOPSY:  - One lymph node, negative for carcinoma (0/1).   E. LYMPH NODE, LEVEL 9 #1, BIOPSY:  - One lymph node, negative for carcinoma (0/1).   F. LYMPH NODE, LEVEL 12 #2, BIOPSY:  - One lymph node, negative for carcinoma (0/1).   G. LYMPH NODE, LEVEL 12 #3, BIOPSY:  - One lymph node, negative for carcinoma (0/1).   H. LYMPH NODE, LEVEL 11 #3, BIOPSY:  - One lymph node, negative for carcinoma (0/1).   I. LYMPH NODE, LEVEL 7, BIOPSY:  - One lymph node, negative for carcinoma (0/1).   ONCOLOGY TABLE:   LUNG: Resection   Pathologic Stage Classification (pTNM, AJCC 8th Edition): pT1c, pN0     Assessment / Plan:   68 yo male s/p R VATS, RLLectomy for  T1cN0M0 adenocarcinoma of the lung Will f/u in 1 month with repeat CXR.  If effusion remains, will tap it Case discussed in Tumor board, no need for adjuvant therapy   Lajuana Matte 03/17/2019 1:11 PM

## 2019-03-22 ENCOUNTER — Encounter (HOSPITAL_COMMUNITY): Payer: Self-pay | Admitting: Hematology

## 2019-03-22 ENCOUNTER — Inpatient Hospital Stay (HOSPITAL_COMMUNITY): Payer: Medicare Other | Attending: Hematology | Admitting: Hematology

## 2019-03-22 ENCOUNTER — Other Ambulatory Visit: Payer: Self-pay

## 2019-03-22 VITALS — BP 129/59 | HR 73 | Temp 98.9°F | Resp 18 | Wt 294.8 lb

## 2019-03-22 DIAGNOSIS — Z23 Encounter for immunization: Secondary | ICD-10-CM | POA: Insufficient documentation

## 2019-03-22 DIAGNOSIS — Z79899 Other long term (current) drug therapy: Secondary | ICD-10-CM | POA: Diagnosis not present

## 2019-03-22 DIAGNOSIS — I10 Essential (primary) hypertension: Secondary | ICD-10-CM | POA: Diagnosis not present

## 2019-03-22 DIAGNOSIS — E1140 Type 2 diabetes mellitus with diabetic neuropathy, unspecified: Secondary | ICD-10-CM | POA: Insufficient documentation

## 2019-03-22 DIAGNOSIS — Z7982 Long term (current) use of aspirin: Secondary | ICD-10-CM | POA: Insufficient documentation

## 2019-03-22 DIAGNOSIS — Z808 Family history of malignant neoplasm of other organs or systems: Secondary | ICD-10-CM | POA: Diagnosis not present

## 2019-03-22 DIAGNOSIS — Z0000 Encounter for general adult medical examination without abnormal findings: Secondary | ICD-10-CM

## 2019-03-22 DIAGNOSIS — E7800 Pure hypercholesterolemia, unspecified: Secondary | ICD-10-CM | POA: Insufficient documentation

## 2019-03-22 DIAGNOSIS — C3491 Malignant neoplasm of unspecified part of right bronchus or lung: Secondary | ICD-10-CM | POA: Diagnosis not present

## 2019-03-22 DIAGNOSIS — R918 Other nonspecific abnormal finding of lung field: Secondary | ICD-10-CM | POA: Diagnosis not present

## 2019-03-22 DIAGNOSIS — Z87891 Personal history of nicotine dependence: Secondary | ICD-10-CM | POA: Diagnosis not present

## 2019-03-22 MED ORDER — INFLUENZA VAC A&B SA ADJ QUAD 0.5 ML IM PRSY
0.5000 mL | PREFILLED_SYRINGE | Freq: Once | INTRAMUSCULAR | Status: AC
  Administered 2019-03-22: 0.5 mL via INTRAMUSCULAR
  Filled 2019-03-22: qty 0.5

## 2019-03-22 NOTE — Progress Notes (Signed)
Gregory Lindsey, [place] [address]   CLINIC:  Medical Oncology/Hematology  PCP:  Lemmie Evens, MD [place] [address] [phone]   REASON FOR VISIT: Follow-up for adenocarcinoma of the lung  CURRENT THERAPY: surgical removal and surveillance     INTERVAL HISTORY:  Gregory Lindsey 68 y.o. male returns for routine follow-up for lung cancer. Gregory Lindsey reports Gregory Lindsey is doing well since his surgery. Gregory Lindsey has no complaints at this time. Denies any nausea, vomiting, or diarrhea. Denies any new pains. Had not noticed any recent bleeding such as epistaxis, hematuria or hematochezia. Denies recent chest pain on exertion, shortness of breath on minimal exertion, pre-syncopal episodes, or palpitations. Denies any numbness or tingling in hands or feet. Denies any recent fevers, infections, or recent hospitalizations. Gregory Lindsey reports appetite at 100% and energy level at 50%. Gregory Lindsey is eating well.    REVIEW OF SYSTEMS:  Review of Systems  All other systems reviewed and are negative.    PAST MEDICAL/SURGICAL HISTORY:  Past Medical History:  Diagnosis Date  . AKI (acute kidney injury) ([place]) 02/2019  . Diabetes mellitus   . Hypercholesterolemia   . Hypertension   . Pneumonia    Past Surgical History:  Procedure Laterality Date  . CHOLECYSTECTOMY N/A 01/22/2016   Procedure: LAPAROSCOPIC CHOLECYSTECTOMY;  Surgeon: Aviva Signs, MD;  Location: [facility];  Service: General;  Laterality: N/A;  . COLONOSCOPY  2007 [facility]   1 CM SIMPLE ADENOMA  . COLONOSCOPY N/A 04/26/2015   Procedure: COLONOSCOPY;  Surgeon: Danie Binder, MD;  Location: [facility];  Service: Endoscopy;  Laterality: N/A;  930   . VIDEO ASSISTED THORACOSCOPY (VATS)/ LOBECTOMY Right 03/07/2019   Procedure: VIDEO ASSISTED THORACOSCOPY (VATS)/RIGHT LOWER LOBECTOMY;  Surgeon: Lajuana Matte, MD;  Location: [facility];  Service: Thoracic;  Laterality: Right;  Marland Kitchen VIDEO BRONCHOSCOPY N/A  03/07/2019   Procedure: VIDEO BRONCHOSCOPY;  Surgeon: Lajuana Matte, MD;  Location: [facility];  Service: Thoracic;  Laterality: N/A;  . VIDEO BRONCHOSCOPY WITH ENDOBRONCHIAL NAVIGATION N/A 02/22/2019   Procedure: VIDEO BRONCHOSCOPY WITH ENDOBRONCHIAL NAVIGATION WITH BIOPIES;  Surgeon: Lajuana Matte, MD;  Location: [facility];  Service: Thoracic;  Laterality: N/A;     SOCIAL HISTORY:  Social History   Socioeconomic History  . Marital status: Married    Spouse name: Not on file  . Number of children: Not on file  . Years of education: Not on file  . Highest education level: Not on file  Occupational History  . Occupation: retired  Scientific laboratory technician  . Financial resource strain: Not very hard  . Food insecurity    Worry: Never true    Inability: Never true  . Transportation needs    Medical: No    Non-medical: No  Tobacco Use  . Smoking status: Former Research scientist (life sciences)  . Smokeless tobacco: Never Used  Substance and Sexual Activity  . Alcohol use: Yes    Alcohol/week: 0.0 standard drinks    Comment: beer 2-3 times per week, 2 shots of scotch on saturday night , rarely since June  . Drug use: No  . Sexual activity: Not Currently  Lifestyle  . Physical activity    Days per week: 0 days    Minutes per session: 0 min  . Stress: Not at all  Relationships  . Social connections    Talks on phone: More than three times a week    Gets together: More than three times a week  Attends religious service: Never    Active member of club or organization: No    Attends meetings of clubs or organizations: Never    Relationship status: Married  . Intimate partner violence    Fear of current or ex partner: No    Emotionally abused: No    Physically abused: No    Forced sexual activity: No  Other Topics Concern  . Not on file  Social History Narrative  . Not on file    FAMILY HISTORY:  Family History  Problem Relation Age of Onset  . Cancer Paternal Grandfather   . Colon cancer Neg Hx      CURRENT MEDICATIONS:  Outpatient Encounter Medications as of 03/22/2019  Medication Sig  . amLODipine (NORVASC) 10 MG tablet Take 1 tablet (10 mg total) by mouth daily.  Marland Kitchen aspirin 81 MG tablet Take 81 mg by mouth every evening.   . blood glucose meter kit and supplies KIT Dispense based on Gregory Lindsey and insurance preference. Use up to four times daily as directed. (FOR ICD-9 250.00, 250.01).  . cyanocobalamin 2000 MCG tablet Take 2,000 mcg by mouth 2 (two) times daily.   . ferrous sulfate (IRON SUPPLEMENT) 325 (65 FE) MG tablet Take 325 mg by mouth 2 (two) times daily.    . insulin aspart protamine- aspart (NOVOLOG MIX 70/30) (70-30) 100 UNIT/ML injection Inject 0.08 mLs (8 Units total) into the skin 2 (two) times daily with a meal.  . Multiple Vitamin (MULTIVITAMIN WITH MINERALS) TABS tablet Take 1 tablet by mouth daily.  . pravastatin (PRAVACHOL) 40 MG tablet Take 40 mg by mouth daily.   . traMADol (ULTRAM) 50 MG tablet Take 1-2 tablets (50-100 mg total) by mouth every 6 (six) hours as needed (mild pain). (Gregory Lindsey not taking: Reported on 03/22/2019)  . [EXPIRED] influenza vaccine adjuvanted (FLUAD) injection 0.5 mL    No facility-administered encounter medications on file as of 03/22/2019.     ALLERGIES:  No Known Allergies   PHYSICAL EXAM:  ECOG Performance status: 1  Vitals:   03/22/19 1428  BP: (!) 129/59  Pulse: 73  Resp: 18  Temp: 98.9 F (37.2 C)  SpO2: 99%   Filed Weights   03/22/19 1428  Weight: 294 lb 12.8 oz (133.7 kg)    Physical Exam Constitutional:      Appearance: Normal appearance. Gregory Lindsey is normal weight.  Cardiovascular:     Rate and Rhythm: Normal rate and regular rhythm.     Heart sounds: Normal heart sounds.  Pulmonary:     Effort: Pulmonary effort is normal.     Breath sounds: Normal breath sounds.  Abdominal:     General: Bowel sounds are normal.     Palpations: Abdomen is soft.  Musculoskeletal: Normal range of motion.  Skin:    General: Skin is  warm.  Neurological:     Mental Status: Gregory Lindsey is alert and oriented to person, place, and time. Mental status is at baseline.  Psychiatric:        Mood and Affect: Mood normal.        Behavior: Behavior normal.        Thought Content: Thought content normal.        Judgment: Judgment normal.      LABORATORY DATA:  I have reviewed the labs as listed.  CBC    Component Value Date/Time   WBC 16.4 (H) 03/09/2019 0337   RBC 3.48 (L) 03/09/2019 0337   HGB 10.2 (L) 03/09/2019 2244  HCT 31.1 (L) 03/09/2019 0337   PLT 246 03/09/2019 0337   MCV 89.4 03/09/2019 0337   MCH 29.3 03/09/2019 0337   MCHC 32.8 03/09/2019 0337   RDW 12.9 03/09/2019 0337   LYMPHSABS 1.4 11/30/2018 2222   MONOABS 0.3 11/30/2018 2222   EOSABS 0.0 11/30/2018 2222   BASOSABS 0.0 11/30/2018 2222   CMP Latest Ref Rng & Units 03/09/2019 03/08/2019 03/06/2019  Glucose 70 - 99 mg/dL 127(H) 209(H) 191(H)  BUN 8 - 23 mg/dL 16 15 15   Creatinine 0.61 - 1.24 mg/dL 1.79(H) 1.82(H) 1.63(H)  Sodium 135 - 145 mmol/L 138 136 137  Potassium 3.5 - 5.1 mmol/L 4.1 4.4 4.1  Chloride 98 - 111 mmol/L 102 104 104  CO2 22 - 32 mmol/L 25 23 22   Calcium 8.9 - 10.3 mg/dL 9.0 8.4(L) 9.2  Total Protein 6.5 - 8.1 g/dL 7.1 - -  Total Bilirubin 0.3 - 1.2 mg/dL 0.7 - -  Alkaline Phos 38 - 126 U/L 85 - -  AST 15 - 41 U/L 21 - -  ALT 0 - 44 U/L 22 - -       DIAGNOSTIC IMAGING:  I have independently reviewed the scans and discussed with the Gregory Lindsey.   I have reviewed Francene Finders, NP's note and agree with the documentation.  I personally performed a face-to-face visit, made revisions and my assessment and plan is as follows.    ASSESSMENT & PLAN:   Adenocarcinoma of right lung, stage 1 ([place]) 1.  Right lung adenocarcinoma (PT1CPN0): -Incidental right lung mass on a CT CAP done for abdominal pain. -Quit smoking 19 years ago, smoked 1/4 pack of cigarettes a day for 18 years followed by cigars and pipes for 10 years. - PET scan on  01/16/2019 shows 1.5 x 2.4 cm right lower lobe mass with SUV 3.2 with no adenopathy or metastatic disease. -MRI of the brain on 01/03/2019 was negative for metastatic disease. - Gregory Lindsey underwent right lobectomy and lymph node biopsy on 03/07/2019 which showed 2.3 cm adenocarcinoma, acinar predominant.  0/8 lymph nodes from level 7, 11, 9, 12 were negative.  Margins were negative.  No visceral pleural invasion.  No lymphovascular invasion. - I discussed the pathology report in detail with the Gregory Lindsey. - I have recommended CT scan of the chest with contrast in 4 months.  We will do surveillance visits with CT scan of the chest once every 6 months for the first 2 to 3 years.  This will be followed by yearly CT scan.  2.  Diabetic neuropathy: -Gregory Lindsey has numbness in the feet from longstanding diabetes which is stable.   Total time spent is 25 minutes with more than 50% of the time spent face-to-face discussing path report, surveillance plan, counseling and coordination of care.  Orders placed this encounter:  Orders Placed This Encounter  Procedures  . CT CHEST W CONTRAST  . CBC with Differential/Platelet  . Comprehensive metabolic panel  . Vitamin B12  . VITAMIN D 25 Hydroxy (Vit-D Deficiency, Fractures)  . Lactate dehydrogenase      Derek Jack, MD [place] [phone]

## 2019-03-22 NOTE — Assessment & Plan Note (Signed)
1.  Right lung adenocarcinoma (PT1CPN0): -Incidental right lung mass on a CT CAP done for abdominal pain. -Quit smoking 19 years ago, smoked 1/4 pack of cigarettes a day for 18 years followed by cigars and pipes for 10 years. - PET scan on 01/16/2019 shows 1.5 x 2.4 cm right lower lobe mass with SUV 3.2 with no adenopathy or metastatic disease. -MRI of the brain on 01/03/2019 was negative for metastatic disease. - He underwent right lobectomy and lymph node biopsy on 03/07/2019 which showed 2.3 cm adenocarcinoma, acinar predominant.  0/8 lymph nodes from level 7, 11, 9, 12 were negative.  Margins were negative.  No visceral pleural invasion.  No lymphovascular invasion. - I discussed the pathology report in detail with the patient. - I have recommended CT scan of the chest with contrast in 4 months.  We will do surveillance visits with CT scan of the chest once every 6 months for the first 2 to 3 years.  This will be followed by yearly CT scan.  2.  Diabetic neuropathy: -He has numbness in the feet from longstanding diabetes which is stable.

## 2019-03-22 NOTE — Patient Instructions (Signed)
[place] at [facility] Discharge Instructions  Follow up in 4 months with labs and scan   Thank you for choosing [place] at [facility] to provide your oncology and hematology care.  To afford each patient quality time with our provider, please arrive at least 15 minutes before your scheduled appointment time.   If you have a lab appointment with the [facility] please come in thru the Main Entrance and check in at the main information desk.  You need to re-schedule your appointment should you arrive 10 or more minutes late.  We strive to give you quality time with our providers, and arriving late affects you and other patients whose appointments are after yours.  Also, if you no show three or more times for appointments you may be dismissed from the clinic at the providers discretion.     Again, thank you for choosing [facility].  Our hope is that these requests will decrease the amount of time that you wait before being seen by our physicians.       _____________________________________________________________  Should you have questions after your visit to [facility], please contact our office at [phone] between the hours of 8:00 a.m. and 4:30 p.m.  Voicemails left after 4:00 p.m. will not be returned until the following business day.  For prescription refill requests, have your pharmacy contact our office and allow 72 hours.    Due to Covid, you will need to wear a mask upon entering the hospital. If you do not have a mask, a mask will be given to you at the Main Entrance upon arrival. For doctor visits, patients may have 1 support person with them. For treatment visits, patients can not have anyone with them due to social distancing guidelines and our immunocompromised population.

## 2019-04-17 ENCOUNTER — Other Ambulatory Visit: Payer: Self-pay | Admitting: Thoracic Surgery (Cardiothoracic Vascular Surgery)

## 2019-04-19 ENCOUNTER — Other Ambulatory Visit: Payer: Self-pay | Admitting: Thoracic Surgery (Cardiothoracic Vascular Surgery)

## 2019-04-19 DIAGNOSIS — C3490 Malignant neoplasm of unspecified part of unspecified bronchus or lung: Secondary | ICD-10-CM

## 2019-04-20 ENCOUNTER — Ambulatory Visit: Payer: Medicare Other | Admitting: Nutrition

## 2019-04-21 ENCOUNTER — Ambulatory Visit
Admission: RE | Admit: 2019-04-21 | Discharge: 2019-04-21 | Disposition: A | Discharge status: Home / Self Care | Payer: Medicare Other | Source: Ambulatory Visit | Attending: Thoracic Surgery (Cardiothoracic Vascular Surgery) | Admitting: Thoracic Surgery (Cardiothoracic Vascular Surgery)

## 2019-04-21 ENCOUNTER — Ambulatory Visit (INDEPENDENT_AMBULATORY_CARE_PROVIDER_SITE_OTHER): Payer: Self-pay | Admitting: Thoracic Surgery (Cardiothoracic Vascular Surgery)

## 2019-04-21 ENCOUNTER — Encounter: Payer: Self-pay | Admitting: Thoracic Surgery (Cardiothoracic Vascular Surgery)

## 2019-04-21 ENCOUNTER — Other Ambulatory Visit: Payer: Self-pay

## 2019-04-21 VITALS — BP 166/90 | HR 98 | Temp 97.6°F | Resp 20 | Ht 70.0 in | Wt 306.0 lb

## 2019-04-21 DIAGNOSIS — C3490 Malignant neoplasm of unspecified part of unspecified bronchus or lung: Secondary | ICD-10-CM

## 2019-04-21 DIAGNOSIS — J9811 Atelectasis: Secondary | ICD-10-CM | POA: Diagnosis not present

## 2019-04-21 DIAGNOSIS — Z09 Encounter for follow-up examination after completed treatment for conditions other than malignant neoplasm: Secondary | ICD-10-CM

## 2019-04-21 DIAGNOSIS — J90 Pleural effusion, not elsewhere classified: Secondary | ICD-10-CM | POA: Diagnosis not present

## 2019-04-21 DIAGNOSIS — Z902 Acquired absence of lung [part of]: Secondary | ICD-10-CM

## 2019-04-21 NOTE — Progress Notes (Signed)
      [address]       [place] [address]             [phone]        Gregory Lindsey Medical Record #[id number] Date of Birth: 1951/01/15  Referring: Gregory Jack, MD Primary Care: Gregory Evens, MD Primary Cardiologist:No primary care provider on file.  Reason for visit:   follow-up  History of Present Illness:     Gregory Lindsey comes and for 1 month appointment.  He had an effusion noted on the right side a month ago.  Regards to his symptoms, he feels much better.  He still has some shortness of breath with exertion.  His blood sugars have been elevated, but he is working with his primary care to control his diabetes.  Physical Exam: BP (!) 166/90   Pulse 98   Temp 97.6 F (36.4 C) (Skin)   Resp 20   Ht 5\' 10"  (1.778 m)   Wt (!) 306 lb (138.8 kg)   SpO2 96% Comment: RA  BMI 43.91 kg/m   Alert NAD  Incision well healed.   Abdomen soft, NT/ND no peripheral edema   Diagnostic Studies & Laboratory data: CXR: Small right-sided effusion     Assessment / Plan:   68 yo male s/p R VATS, RLLectomy for T1cN0M0 adenocarcinoma of the lung Will f.u in 1 yr with LD CT chest   Gregory Lindsey O Gregory Lindsey 04/21/2019 11:02 AM

## 2019-05-15 DIAGNOSIS — E782 Mixed hyperlipidemia: Secondary | ICD-10-CM | POA: Diagnosis not present

## 2019-05-15 DIAGNOSIS — C3431 Malignant neoplasm of lower lobe, right bronchus or lung: Secondary | ICD-10-CM | POA: Diagnosis not present

## 2019-05-15 DIAGNOSIS — E1142 Type 2 diabetes mellitus with diabetic polyneuropathy: Secondary | ICD-10-CM | POA: Diagnosis not present

## 2019-05-15 DIAGNOSIS — I10 Essential (primary) hypertension: Secondary | ICD-10-CM | POA: Diagnosis not present

## 2019-05-19 DIAGNOSIS — E1165 Type 2 diabetes mellitus with hyperglycemia: Secondary | ICD-10-CM | POA: Diagnosis not present

## 2019-05-19 DIAGNOSIS — E1142 Type 2 diabetes mellitus with diabetic polyneuropathy: Secondary | ICD-10-CM | POA: Diagnosis not present

## 2019-05-19 DIAGNOSIS — D649 Anemia, unspecified: Secondary | ICD-10-CM | POA: Diagnosis not present

## 2019-05-19 DIAGNOSIS — E781 Pure hyperglyceridemia: Secondary | ICD-10-CM | POA: Diagnosis not present

## 2019-05-19 DIAGNOSIS — E119 Type 2 diabetes mellitus without complications: Secondary | ICD-10-CM | POA: Diagnosis not present

## 2019-05-19 DIAGNOSIS — R944 Abnormal results of kidney function studies: Secondary | ICD-10-CM | POA: Diagnosis not present

## 2019-05-19 DIAGNOSIS — F649 Gender identity disorder, unspecified: Secondary | ICD-10-CM | POA: Diagnosis not present

## 2019-05-19 DIAGNOSIS — Z125 Encounter for screening for malignant neoplasm of prostate: Secondary | ICD-10-CM | POA: Diagnosis not present

## 2019-05-19 DIAGNOSIS — I10 Essential (primary) hypertension: Secondary | ICD-10-CM | POA: Diagnosis not present

## 2019-05-19 DIAGNOSIS — E782 Mixed hyperlipidemia: Secondary | ICD-10-CM | POA: Diagnosis not present

## 2019-05-30 ENCOUNTER — Encounter: Payer: Medicare Other | Attending: Family Medicine | Admitting: Nutrition

## 2019-05-30 ENCOUNTER — Other Ambulatory Visit: Payer: Self-pay

## 2019-05-30 ENCOUNTER — Encounter: Payer: Self-pay | Admitting: Nutrition

## 2019-05-30 VITALS — Ht 70.0 in | Wt 313.0 lb

## 2019-05-30 DIAGNOSIS — E785 Hyperlipidemia, unspecified: Secondary | ICD-10-CM | POA: Diagnosis not present

## 2019-05-30 DIAGNOSIS — E1169 Type 2 diabetes mellitus with other specified complication: Secondary | ICD-10-CM | POA: Insufficient documentation

## 2019-05-30 DIAGNOSIS — Z6841 Body Mass Index (BMI) 40.0 and over, adult: Secondary | ICD-10-CM | POA: Diagnosis not present

## 2019-05-30 DIAGNOSIS — I10 Essential (primary) hypertension: Secondary | ICD-10-CM | POA: Insufficient documentation

## 2019-05-30 NOTE — Progress Notes (Signed)
  Medical Nutrition Therapy:  Appt start time: 0800 end time:  0930   Assessment:  Primary concerns today: DIabetes, LIves with wife. 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 He admits to eating for boredom and eating large portions. Admits to gaining about 17 lbs since his lung surgery. Hasn't been as active and thinks he has been eating larger portions. He notes he does have some swelling. Has been eating higher sodium foods of country ham, and using sea salt and other high sodium foods.  He recently had a cancerous tumor removed from his lungs about 2 months ago. Currently taking insulin 70/30 12 units twice a day. Eating 3 meals plus snacks between. Willing to make changes with diet and exercise to improve his DM and lose weight.  Lab Results  Component Value Date   HGBA1C 9.8 (H) 02/22/2019   CMP Latest Ref Rng & Units 03/09/2019 03/08/2019 03/06/2019  Glucose 70 - 99 mg/dL 127(H) 209(H) 191(H)  BUN 8 - 23 mg/dL 16 15 15   Creatinine 0.61 - 1.24 mg/dL 1.79(H) 1.82(H) 1.63(H)  Sodium 135 - 145 mmol/L 138 136 137  Potassium 3.5 - 5.1 mmol/L 4.1 4.4 4.1  Chloride 98 - 111 mmol/L 102 104 104  CO2 22 - 32 mmol/L 25 23 22   Calcium 8.9 - 10.3 mg/dL 9.0 8.4(L) 9.2  Total Protein 6.5 - 8.1 g/dL 7.1 - -  Total Bilirubin 0.3 - 1.2 mg/dL 0.7 - -  Alkaline Phos 38 - 126 U/L 85 - -  AST 15 - 41 U/L 21 - -  ALT 0 - 44 U/L 22 - -    Preferred Learning Style:  No preference indicated   Learning Readiness:   Ready  Change in progress   MEDICATIONS:    DIETARY INTAKE:  24-hr recall:  B ( AM):  Fiber 1 cereal, country ham and boiled egg, coiffee and water Snk ( AM): grapes, potato chips L ( PM): deer meat and whole wheat bread 2 slices,  Lemonade with splenda Snk ( PM): D ( PM): BBQ  chicken, cabbage, sliced tomatoes,  Snk ( PM): misc Beverages: water  Usual physical activity: ADL  Estimated energy needs: 1800 calories 200 g carbohydrates 135 g protein 50 g fat  Progress Towards  Goal(s):  In progress.   Nutritional Diagnosis:  NB-1.1 Food and nutrition-related knowledge deficit As related to Diabetes and OBesity.  As evidenced by A1C 8.9% and BMI 44.    Intervention:  Nutrition and Diabetes education provided on My Plate, CHO counting, meal planning, portion sizes, timing of meals, avoiding snacks between meals unless having a low blood sugar, target ranges for A1C and blood sugars, signs/symptoms and treatment of hyper/hypoglycemia, monitoring blood sugars, taking medications as prescribed, benefits of exercising 30 minutes per day and prevention of complications of DM. Marland Kitchen Goals Follow MY Plate\ Watch portion sizes Exercise minutes 3 times per week Cut out snacks Cut out sweets, processed food and junk food Test blood sugars twice a day Increase fresh fruits and low carb vegetables Drink water only. Lose 1-2 lbs per week Get A1C down to 7%  Teaching Method Utilized:  Visual Auditory Hands on  Handouts given during visit include:  The Plate Method  Meal Plan Card  Diabetes Instructions   Barriers to learning/adherence to lifestyle change: none  Demonstrated degree of understanding via:  Teach Back   Monitoring/Evaluation:  Dietary intake, exercise,  and body weight in 1 month(s).

## 2019-05-30 NOTE — Patient Instructions (Addendum)
Goals Follow MY Plate\ Watch portion sizes Exercise minutes 3 times per week Cut out snacks Cut out sweets, processed food and junk food Test blood sugars twice a day Increase fresh fruits and low carb vegetables Drink water only. Lose 1-2 lbs per week Get A1C down to 7%

## 2019-07-03 ENCOUNTER — Encounter: Payer: Medicare Other | Attending: Family Medicine | Admitting: Nutrition

## 2019-07-03 ENCOUNTER — Encounter: Payer: Self-pay | Admitting: Nutrition

## 2019-07-03 ENCOUNTER — Other Ambulatory Visit: Payer: Self-pay

## 2019-07-03 DIAGNOSIS — E785 Hyperlipidemia, unspecified: Secondary | ICD-10-CM | POA: Insufficient documentation

## 2019-07-03 DIAGNOSIS — N183 Chronic kidney disease, stage 3 unspecified: Secondary | ICD-10-CM | POA: Insufficient documentation

## 2019-07-03 DIAGNOSIS — I10 Essential (primary) hypertension: Secondary | ICD-10-CM | POA: Insufficient documentation

## 2019-07-03 DIAGNOSIS — Z6841 Body Mass Index (BMI) 40.0 and over, adult: Secondary | ICD-10-CM | POA: Insufficient documentation

## 2019-07-03 NOTE — Patient Instructions (Signed)
Goals  Follow MY Plate-more vegetables. Walk treadmill 20 minutes twice a day Cut back on the alcohol. Cut out using salt and processed foods and increase herbs and spices. Cut out grease Increase high fiber foods. Lose 1 per week Goal BS less than 140-150 before breakfast and supper. Don't skip lunch. Get A1C down to 7%

## 2019-07-03 NOTE — Progress Notes (Signed)
Medical Nutrition Therapy:  Appt start time: 1300 end time:  1345 Assessment:  Primary concerns today: DIabetes Type 2, LIves with wife. Changes made: has cut down on portions and cutting down on processed meats. Eating more high fiber foods. Struggles with snacking and portion sizes. Currently taking 70/30 16 units in am and 16 units in pm before supper. FBS 123-197 Before supper 150's-203 mg/dl. He admits to skipping lunch thinking that would help his weight and blood sugars. Re educated on need to eat three balanced meals. He had a lobectomy for lung cancer a few months ago. He is going to MD appointments often. Trying to do some walking on treadmill. Admits to needing to eat more fresh fruits, vegetables. Wants to get an air fryer. Saw Dr. Knowlton in Dec 2020. A1C down to 8.6%.  TCHOl 234 mg,, TG 185 mg/dl, LDL 135 mg/dl. Glu 223 mg/sl, eGFR 44/51 ml/min Creatine 1.60 mg/dl. Lab Results  Component Value Date   HGBA1C 9.8 (H) 02/22/2019   CMP Latest Ref Rng & Units 03/09/2019 03/08/2019 03/06/2019  Glucose 70 - 99 mg/dL 127(H) 209(H) 191(H)  BUN 8 - 23 mg/dL 16 15 15  Creatinine 0.61 - 1.24 mg/dL 1.79(H) 1.82(H) 1.63(H)  Sodium 135 - 145 mmol/L 138 136 137  Potassium 3.5 - 5.1 mmol/L 4.1 4.4 4.1  Chloride 98 - 111 mmol/L 102 104 104  CO2 22 - 32 mmol/L 25 23 22  Calcium 8.9 - 10.3 mg/dL 9.0 8.4(L) 9.2  Total Protein 6.5 - 8.1 g/dL 7.1 - -  Total Bilirubin 0.3 - 1.2 mg/dL 0.7 - -  Alkaline Phos 38 - 126 U/L 85 - -  AST 15 - 41 U/L 21 - -  ALT 0 - 44 U/L 22 - -    Preferred Learning Style:  No preference indicated   Learning Readiness:   Ready  Change in progress   MEDICATIONS:    DIETARY INTAKE:  24-hr recall:  B ( AM): Fiber one cereal, 1 cup milk, gala apple and boiled egg,water Snk ( AM):  L ( PM): skipping lunch often; or can salmon or veggies. water Snk ( PM): D ( PM):  Baked ribs, turnip greens and 1/2 baked potato,  water  Snk ( PM):  Beverages:  water  Usual physical activity: ADL  Estimated energy needs: 1800 calories 200 g carbohydrates 135 g protein 50 g fat  Progress Towards Goal(s):  In progress.   Nutritional Diagnosis:  NB-1.1 Food and nutrition-related knowledge deficit As related to Diabetes and OBesity.  As evidenced by A1C 8.9% and BMI 44.    Intervention:  Nutrition and Diabetes education provided on My Plate, CHO counting, meal planning, portion sizes, timing of meals, avoiding snacks between meals unless having a low blood sugar, target ranges for A1C and blood sugars, signs/symptoms and treatment of hyper/hypoglycemia, monitoring blood sugars, taking medications as prescribed, benefits of exercising 30 minutes per day and prevention of complications of DM. . Goals  Follow MY Plate-more vegetables. Walk treadmill 20 minutes twice a day Cut back on the alcohol. Cut out using salt and processed foods and increase herbs and spices. Cut out grease Increase high fiber foods. Lose 1 per week Goal BS less than 140-150 before breakfast and supper. Don't skip lunch. Get A1C down to 7%   Teaching Method Utilized:  Visual Auditory Hands on  Handouts given during visit include:  The Plate Method  Meal Plan Card  Diabetes Instructions   Barriers to learning/adherence to lifestyle   change: none  Demonstrated degree of understanding via:  Teach Back   Monitoring/Evaluation:  Dietary intake, exercise,  and body weight in 1 month(s).

## 2019-07-19 ENCOUNTER — Ambulatory Visit (HOSPITAL_COMMUNITY)
Admission: RE | Admit: 2019-07-19 | Discharge: 2019-07-19 | Disposition: A | Discharge status: Home / Self Care | Payer: Medicare Other | Source: Ambulatory Visit | Attending: Nurse Practitioner | Admitting: Nurse Practitioner

## 2019-07-19 ENCOUNTER — Inpatient Hospital Stay (HOSPITAL_COMMUNITY): Payer: Medicare Other | Attending: Hematology

## 2019-07-19 ENCOUNTER — Other Ambulatory Visit: Payer: Self-pay

## 2019-07-19 DIAGNOSIS — E1140 Type 2 diabetes mellitus with diabetic neuropathy, unspecified: Secondary | ICD-10-CM | POA: Diagnosis not present

## 2019-07-19 DIAGNOSIS — Z7982 Long term (current) use of aspirin: Secondary | ICD-10-CM | POA: Diagnosis not present

## 2019-07-19 DIAGNOSIS — R0602 Shortness of breath: Secondary | ICD-10-CM | POA: Insufficient documentation

## 2019-07-19 DIAGNOSIS — Z87891 Personal history of nicotine dependence: Secondary | ICD-10-CM | POA: Insufficient documentation

## 2019-07-19 DIAGNOSIS — K5909 Other constipation: Secondary | ICD-10-CM | POA: Insufficient documentation

## 2019-07-19 DIAGNOSIS — E1122 Type 2 diabetes mellitus with diabetic chronic kidney disease: Secondary | ICD-10-CM | POA: Insufficient documentation

## 2019-07-19 DIAGNOSIS — C3491 Malignant neoplasm of unspecified part of right bronchus or lung: Secondary | ICD-10-CM

## 2019-07-19 DIAGNOSIS — C3431 Malignant neoplasm of lower lobe, right bronchus or lung: Secondary | ICD-10-CM | POA: Insufficient documentation

## 2019-07-19 DIAGNOSIS — Z79899 Other long term (current) drug therapy: Secondary | ICD-10-CM | POA: Diagnosis not present

## 2019-07-19 DIAGNOSIS — E7800 Pure hypercholesterolemia, unspecified: Secondary | ICD-10-CM | POA: Insufficient documentation

## 2019-07-19 DIAGNOSIS — I129 Hypertensive chronic kidney disease with stage 1 through stage 4 chronic kidney disease, or unspecified chronic kidney disease: Secondary | ICD-10-CM | POA: Diagnosis not present

## 2019-07-19 LAB — CBC WITH DIFFERENTIAL/PLATELET
Abs Immature Granulocytes: 0.02 10*3/uL (ref 0.00–0.07)
Basophils Absolute: 0.1 10*3/uL (ref 0.0–0.1)
Basophils Relative: 1 %
Eosinophils Absolute: 0.2 10*3/uL (ref 0.0–0.5)
Eosinophils Relative: 2 %
HCT: 39.3 % (ref 39.0–52.0)
Hemoglobin: 12 g/dL — ABNORMAL LOW (ref 13.0–17.0)
Immature Granulocytes: 0 %
Lymphocytes Relative: 39 %
Lymphs Abs: 3.9 10*3/uL (ref 0.7–4.0)
MCH: 27.5 pg (ref 26.0–34.0)
MCHC: 30.5 g/dL (ref 30.0–36.0)
MCV: 90.1 fL (ref 80.0–100.0)
Monocytes Absolute: 0.8 10*3/uL (ref 0.1–1.0)
Monocytes Relative: 9 %
Neutro Abs: 4.9 10*3/uL (ref 1.7–7.7)
Neutrophils Relative %: 49 %
Platelets: 295 10*3/uL (ref 150–400)
RBC: 4.36 MIL/uL (ref 4.22–5.81)
RDW: 13.2 % (ref 11.5–15.5)
WBC: 9.8 10*3/uL (ref 4.0–10.5)
nRBC: 0 % (ref 0.0–0.2)

## 2019-07-19 LAB — COMPREHENSIVE METABOLIC PANEL
ALT: 23 U/L (ref 0–44)
AST: 24 U/L (ref 15–41)
Albumin: 4.6 g/dL (ref 3.5–5.0)
Alkaline Phosphatase: 72 U/L (ref 38–126)
Anion gap: 10 (ref 5–15)
BUN: 26 mg/dL — ABNORMAL HIGH (ref 8–23)
CO2: 26 mmol/L (ref 22–32)
Calcium: 9.6 mg/dL (ref 8.9–10.3)
Chloride: 102 mmol/L (ref 98–111)
Creatinine, Ser: 1.72 mg/dL — ABNORMAL HIGH (ref 0.61–1.24)
GFR calc Af Amer: 47 mL/min — ABNORMAL LOW (ref >60)
GFR calc non Af Amer: 41 mL/min — ABNORMAL LOW (ref >60)
Glucose, Bld: 89 mg/dL (ref 70–99)
Potassium: 3.9 mmol/L (ref 3.5–5.1)
Sodium: 138 mmol/L (ref 135–145)
Total Bilirubin: 0.7 mg/dL (ref 0.3–1.2)
Total Protein: 8.2 g/dL — ABNORMAL HIGH (ref 6.5–8.1)

## 2019-07-19 LAB — LACTATE DEHYDROGENASE: LDH: 139 U/L (ref 98–192)

## 2019-07-19 LAB — VITAMIN B12: Vitamin B-12: 5220 pg/mL — ABNORMAL HIGH (ref 180–914)

## 2019-07-19 LAB — VITAMIN D 25 HYDROXY (VIT D DEFICIENCY, FRACTURES): Vit D, 25-Hydroxy: 32.69 ng/mL (ref 30–100)

## 2019-07-19 MED ORDER — IOHEXOL 300 MG/ML  SOLN
75.0000 mL | Freq: Once | INTRAMUSCULAR | Status: AC | PRN
  Administered 2019-07-19: 75 mL via INTRAVENOUS

## 2019-07-26 ENCOUNTER — Inpatient Hospital Stay (HOSPITAL_BASED_OUTPATIENT_CLINIC_OR_DEPARTMENT_OTHER): Payer: Medicare Other | Admitting: Hematology

## 2019-07-26 ENCOUNTER — Other Ambulatory Visit: Payer: Self-pay

## 2019-07-26 ENCOUNTER — Encounter (HOSPITAL_COMMUNITY): Payer: Self-pay | Admitting: Hematology

## 2019-07-26 VITALS — BP 143/73 | HR 103 | Temp 97.1°F | Resp 18 | Wt 307.6 lb

## 2019-07-26 DIAGNOSIS — R0602 Shortness of breath: Secondary | ICD-10-CM | POA: Diagnosis not present

## 2019-07-26 DIAGNOSIS — K5909 Other constipation: Secondary | ICD-10-CM | POA: Diagnosis not present

## 2019-07-26 DIAGNOSIS — Z87891 Personal history of nicotine dependence: Secondary | ICD-10-CM | POA: Diagnosis not present

## 2019-07-26 DIAGNOSIS — C3491 Malignant neoplasm of unspecified part of right bronchus or lung: Secondary | ICD-10-CM

## 2019-07-26 DIAGNOSIS — I129 Hypertensive chronic kidney disease with stage 1 through stage 4 chronic kidney disease, or unspecified chronic kidney disease: Secondary | ICD-10-CM | POA: Diagnosis not present

## 2019-07-26 DIAGNOSIS — C3431 Malignant neoplasm of lower lobe, right bronchus or lung: Secondary | ICD-10-CM | POA: Diagnosis not present

## 2019-07-26 DIAGNOSIS — E1122 Type 2 diabetes mellitus with diabetic chronic kidney disease: Secondary | ICD-10-CM | POA: Diagnosis not present

## 2019-07-26 NOTE — Patient Instructions (Addendum)
[place] at [facility] Discharge Instructions  You were seen today by Dr. Delton Coombes. He went over your recent lab results. He will repeat a CT scan prior to your next visit.He will see you back in 6 months for labs and follow up.   Thank you for choosing [place] at [facility] to provide your oncology and hematology care.  To afford each patient quality time with our provider, please arrive at least 15 minutes before your scheduled appointment time.   If you have a lab appointment with the [facility] please come in thru the  Main Entrance and check in at the main information desk  You need to re-schedule your appointment should you arrive 10 or more minutes late.  We strive to give you quality time with our providers, and arriving late affects you and other patients whose appointments are after yours.  Also, if you no show three or more times for appointments you may be dismissed from the clinic at the providers discretion.     Again, thank you for choosing [facility].  Our hope is that these requests will decrease the amount of time that you wait before being seen by our physicians.       _____________________________________________________________  Should you have questions after your visit to [facility], please contact our office at [phone] between the hours of 8:00 a.m. and 4:30 p.m.  Voicemails left after 4:00 p.m. will not be returned until the following business day.  For prescription refill requests, have your pharmacy contact our office and allow 72 hours.    Cancer Center Support Programs:   > Cancer Support Group  2nd Tuesday of the month 1pm-2pm, Journey Room

## 2019-07-26 NOTE — Progress Notes (Signed)
[place], [place] [address]   CLINIC:  Medical Oncology/Hematology  PCP:  Lemmie Evens, MD [place] [address] [phone]   REASON FOR VISIT:  Right lung adenocarcinoma.    INTERVAL HISTORY:  Gregory Lindsey 69 y.o. male seen for follow-up of right lung cancer.  Reports appetite and energy levels of 100%.  No pain reported.  Shortness of breath on exertion is stable.  Chronic constipation is also stable.  Denies fevers, night sweats or weight loss.   REVIEW OF SYSTEMS:  Review of Systems  Respiratory: Positive for shortness of breath.   Gastrointestinal: Positive for constipation.  All other systems reviewed and are negative.    PAST MEDICAL/SURGICAL HISTORY:  Past Medical History:  Diagnosis Date  . AKI (acute kidney injury) ([place]) 02/2019  . Diabetes mellitus   . Hypercholesterolemia   . Hypertension   . Pneumonia    Past Surgical History:  Procedure Laterality Date  . CHOLECYSTECTOMY N/A 01/22/2016   Procedure: LAPAROSCOPIC CHOLECYSTECTOMY;  Surgeon: Aviva Signs, MD;  Location: [facility];  Service: General;  Laterality: N/A;  . COLONOSCOPY  2007 [facility]   1 CM SIMPLE ADENOMA  . COLONOSCOPY N/A 04/26/2015   Procedure: COLONOSCOPY;  Surgeon: Danie Binder, MD;  Location: [facility];  Service: Endoscopy;  Laterality: N/A;  930   . VIDEO ASSISTED THORACOSCOPY (VATS)/ LOBECTOMY Right 03/07/2019   Procedure: VIDEO ASSISTED THORACOSCOPY (VATS)/RIGHT LOWER LOBECTOMY;  Surgeon: Lajuana Matte, MD;  Location: [facility];  Service: Thoracic;  Laterality: Right;  Marland Kitchen VIDEO BRONCHOSCOPY N/A 03/07/2019   Procedure: VIDEO BRONCHOSCOPY;  Surgeon: Lajuana Matte, MD;  Location: [facility];  Service: Thoracic;  Laterality: N/A;  . VIDEO BRONCHOSCOPY WITH ENDOBRONCHIAL NAVIGATION N/A 02/22/2019   Procedure: VIDEO BRONCHOSCOPY WITH ENDOBRONCHIAL NAVIGATION WITH BIOPIES;  Surgeon: Lajuana Matte, MD;  Location: [facility];   Service: Thoracic;  Laterality: N/A;     SOCIAL HISTORY:  Social History   Socioeconomic History  . Marital status: Married    Spouse name: Not on file  . Number of children: Not on file  . Years of education: Not on file  . Highest education level: Not on file  Occupational History  . Occupation: retired  Tobacco Use  . Smoking status: Former Research scientist (life sciences)  . Smokeless tobacco: Never Used  Substance and Sexual Activity  . Alcohol use: Yes    Alcohol/week: 0.0 standard drinks    Comment: beer 2-3 times per week, 2 shots of scotch on saturday night , rarely since June  . Drug use: No  . Sexual activity: Not Currently  Other Topics Concern  . Not on file  Social History Narrative  . Not on file   Social Determinants of Health   Financial Resource Strain: Low Risk   . Difficulty of Paying Living Expenses: Not very hard  Food Insecurity: No Food Insecurity  . Worried About Charity fundraiser in the Last Year: Never true  . Ran Out of Food in the Last Year: Never true  Transportation Needs: No Transportation Needs  . Lack of Transportation (Medical): No  . Lack of Transportation (Non-Medical): No  Physical Activity: Inactive  . Days of Exercise per Week: 0 days  . Minutes of Exercise per Session: 0 min  Stress: No Stress Concern Present  . Feeling of Stress : Not at all  Social Connections: Somewhat Isolated  . Frequency of Communication with Friends and Family: More than  three times a week  . Frequency of Social Gatherings with Friends and Family: More than three times a week  . Attends Religious Services: Never  . Active Member of Clubs or Organizations: No  . Attends Archivist Meetings: Never  . Marital Status: Married  Human resources officer Violence: Not At Risk  . Fear of Current or Ex-Partner: No  . Emotionally Abused: No  . Physically Abused: No  . Sexually Abused: No    FAMILY HISTORY:  Family History  Problem Relation Age of Onset  . Cancer Paternal  Grandfather   . Colon cancer Neg Hx     CURRENT MEDICATIONS:  Outpatient Encounter Medications as of 07/26/2019  Medication Sig  . amLODipine (NORVASC) 10 MG tablet Take 1 tablet (10 mg total) by mouth daily.  Marland Kitchen aspirin 81 MG tablet Take 81 mg by mouth every evening.   . blood glucose meter kit and supplies KIT Dispense based on patient and insurance preference. Use up to four times daily as directed. (FOR ICD-9 250.00, 250.01).  . cyanocobalamin 2000 MCG tablet Take 2,000 mcg by mouth 2 (two) times daily.   . ferrous sulfate (IRON SUPPLEMENT) 325 (65 FE) MG tablet Take 325 mg by mouth 2 (two) times daily.    . Multiple Vitamin (MULTIVITAMIN WITH MINERALS) TABS tablet Take 1 tablet by mouth daily.  . pravastatin (PRAVACHOL) 40 MG tablet Take 40 mg by mouth daily.   . insulin aspart protamine- aspart (NOVOLOG MIX 70/30) (70-30) 100 UNIT/ML injection Inject 0.08 mLs (8 Units total) into the skin 2 (two) times daily with a meal. (Patient taking differently: Inject 16 Units into the skin 2 (two) times daily with a meal. )  . traMADol (ULTRAM) 50 MG tablet Take 1-2 tablets (50-100 mg total) by mouth every 6 (six) hours as needed (mild pain). (Patient not taking: Reported on 07/26/2019)   No facility-administered encounter medications on file as of 07/26/2019.    ALLERGIES:  No Known Allergies   PHYSICAL EXAM:  ECOG Performance status: 1  Vitals:   07/26/19 1442  BP: (!) 143/73  Pulse: (!) 103  Resp: 18  Temp: (!) 97.1 F (36.2 C)  SpO2: 98%   Filed Weights   07/26/19 1442  Weight: (!) 307 lb 9.6 oz (139.5 kg)    Physical Exam Vitals reviewed.  Cardiovascular:     Rate and Rhythm: Normal rate and regular rhythm.     Heart sounds: Normal heart sounds.  Pulmonary:     Effort: Pulmonary effort is normal.     Breath sounds: Normal breath sounds.  Abdominal:     General: There is no distension.     Palpations: Abdomen is soft. There is no mass.  Skin:    General: Skin is warm.    Neurological:     General: No focal deficit present.     Mental Status: He is oriented to person, place, and time.  Psychiatric:        Mood and Affect: Mood normal.        Behavior: Behavior normal.      LABORATORY DATA:  I have reviewed the labs as listed.  CBC    Component Value Date/Time   WBC 9.8 07/19/2019 0958   RBC 4.36 07/19/2019 0958   HGB 12.0 (L) 07/19/2019 0958   HCT 39.3 07/19/2019 0958   PLT 295 07/19/2019 0958   MCV 90.1 07/19/2019 0958   MCH 27.5 07/19/2019 0958   MCHC 30.5 07/19/2019 0958  RDW 13.2 07/19/2019 0958   LYMPHSABS 3.9 07/19/2019 0958   MONOABS 0.8 07/19/2019 0958   EOSABS 0.2 07/19/2019 0958   BASOSABS 0.1 07/19/2019 0958   CMP Latest Ref Rng & Units 07/19/2019 03/09/2019 03/08/2019  Glucose 70 - 99 mg/dL 89 127(H) 209(H)  BUN 8 - 23 mg/dL 26(H) 16 15  Creatinine 0.61 - 1.24 mg/dL 1.72(H) 1.79(H) 1.82(H)  Sodium 135 - 145 mmol/L 138 138 136  Potassium 3.5 - 5.1 mmol/L 3.9 4.1 4.4  Chloride 98 - 111 mmol/L 102 102 104  CO2 22 - 32 mmol/L 26 25 23   Calcium 8.9 - 10.3 mg/dL 9.6 9.0 8.4(L)  Total Protein 6.5 - 8.1 g/dL 8.2(H) 7.1 -  Total Bilirubin 0.3 - 1.2 mg/dL 0.7 0.7 -  Alkaline Phos 38 - 126 U/L 72 85 -  AST 15 - 41 U/L 24 21 -  ALT 0 - 44 U/L 23 22 -       DIAGNOSTIC IMAGING:  I have independently reviewed the scans and discussed with the patient.   I have reviewed Venita Lick LPN's note and agree with the documentation.  I personally performed a face-to-face visit, made revisions and my assessment and plan is as follows.    ASSESSMENT & PLAN:   Adenocarcinoma of right lung, stage 1 ([facility]) 1.  Right lung adenocarcinoma (PT1CPN0): -Incidental right lung mass on a CT CAP done for abdominal pain. -Quit smoking 19 years ago, smoked 1/4 pack of cigarettes a day for 18 years followed by cigars and pipes for 10 years. - PET scan on 01/16/2019 shows 1.5 x 2.4 cm right lower lobe mass with SUV 3.2 with no adenopathy or metastatic  disease. -MRI of the brain on 01/03/2019 was negative for metastatic disease. - He underwent right lobectomy and lymph node biopsy on 03/07/2019 which showed 2.3 cm adenocarcinoma, acinar predominant.  0/8 lymph nodes from level 7, 11, 9, 12 were negative.  Margins were negative.  No visceral pleural invasion.  No lymphovascular invasion. -He has been doing well since surgery. -We reviewed CT from 07/19/2019 which showed stable findings.  Scattered tiny nodules in both lungs has been stable.  I have also reviewed his CBC which was normal.  Creatinine is elevated from CKD which is stable. -I have recommended follow-up in 6 months with repeat CT scan and blood work.  2.  Diabetic neuropathy: -He has numbness in the feet from longstanding diabetes which is stable.    Orders placed this encounter:  Orders Placed This Encounter  Procedures  . CT Chest W Contrast  . CBC with Differential/Platelet  . Comprehensive metabolic panel      Derek Jack, MD [place] [phone]

## 2019-07-26 NOTE — Assessment & Plan Note (Signed)
1.  Right lung adenocarcinoma (PT1CPN0): -Incidental right lung mass on a CT CAP done for abdominal pain. -Quit smoking 19 years ago, smoked 1/4 pack of cigarettes a day for 18 years followed by cigars and pipes for 10 years. - PET scan on 01/16/2019 shows 1.5 x 2.4 cm right lower lobe mass with SUV 3.2 with no adenopathy or metastatic disease. -MRI of the brain on 01/03/2019 was negative for metastatic disease. - He underwent right lobectomy and lymph node biopsy on 03/07/2019 which showed 2.3 cm adenocarcinoma, acinar predominant.  0/8 lymph nodes from level 7, 11, 9, 12 were negative.  Margins were negative.  No visceral pleural invasion.  No lymphovascular invasion. -He has been doing well since surgery. -We reviewed CT from 07/19/2019 which showed stable findings.  Scattered tiny nodules in both lungs has been stable.  I have also reviewed his CBC which was normal.  Creatinine is elevated from CKD which is stable. -I have recommended follow-up in 6 months with repeat CT scan and blood work.  2.  Diabetic neuropathy: -He has numbness in the feet from longstanding diabetes which is stable.

## 2019-08-10 DIAGNOSIS — Z23 Encounter for immunization: Secondary | ICD-10-CM | POA: Diagnosis not present

## 2019-08-15 ENCOUNTER — Other Ambulatory Visit: Payer: Self-pay

## 2019-08-15 ENCOUNTER — Encounter: Payer: Self-pay | Admitting: Nutrition

## 2019-08-15 ENCOUNTER — Encounter: Payer: Medicare Other | Attending: Family Medicine | Admitting: Nutrition

## 2019-08-15 DIAGNOSIS — Z713 Dietary counseling and surveillance: Secondary | ICD-10-CM | POA: Diagnosis not present

## 2019-08-15 DIAGNOSIS — E1169 Type 2 diabetes mellitus with other specified complication: Secondary | ICD-10-CM | POA: Diagnosis not present

## 2019-08-15 NOTE — Patient Instructions (Addendum)
Goals Increase water intake Increase exercise Lose 3-4 lbs per month. Don't eat past 7 pm. Avoid processed and salty foods  Get BP 130/ 80 mg/dl or less. Talk to Dr. Karie Kirks about referral to kidney specialist and better management of your high blood pressure. Get A1C to 7% or less.

## 2019-08-15 NOTE — Progress Notes (Signed)
Medical Nutrition Therapy:  Appt start time: 1300 end time:  1345 Assessment:  Primary concerns today: DIabetes Type 2, LIves with wife. FBS 100-150's, 84-130's. Expect is A1C to be between 6-7% hopefully next time he does labs. 16 units of 70/30 BID.  7 day avg 133, 30 day avg 135 mg/dl. BS are much better.  Has cut down on portions and processed foods. Lost 5 lbs in  3 months. Cutting out the grease and using more Mrs. Dash. Kidney function hasn't improved significantly. .CKD 3b. He is doing some sit and fit exercises but admits he needs to do more. Scheduled to see Dr. Karie Kirks on 09-12-19. Would benefit from referral to a nephrologist since is CKD is more chronic vs acute. Results for Gregory Lindsey, Gregory Lindsey (MRN [id number]) as of 08/15/2019 08:14  Ref. Range 07/19/2019 09:58  Creatinine Latest Ref Range: 0.61 - 1.24 mg/dL 1.72 (H)  Calcium Latest Ref Range: 8.9 - 10.3 mg/dL 9.6  Anion gap Latest Ref Range: 5 - 15  10  Alkaline Phosphatase Latest Ref Range: 38 - 126 U/L 72  Albumin Latest Ref Range: 3.5 - 5.0 g/dL 4.6  AST Latest Ref Range: 15 - 41 U/L 24  ALT Latest Ref Range: 0 - 44 U/L 23  Total Protein Latest Ref Range: 6.5 - 8.1 g/dL 8.2 (H)  Total Bilirubin Latest Ref Range: 0.3 - 1.2 mg/dL 0.7  GFR, Est Non African American Latest Ref Range: >60 mL/min 41 (L)  GFR, Est African American Latest Ref Range: >60 mL/min 47 (L)      Admits to needing to eat more fresh fruits, vegetables. Wants to get an air fryer. Saw Dr. Karie Kirks in Dec 2020.Marland Kitchen  TCHOl 234 mg,, TG 185 mg/dl, LDL 135 mg/dl. Glu 223 mg/sl, . Lab Results  Component Value Date   HGBA1C 9.8 (H) 02/22/2019   CMP Latest Ref Rng & Units 07/19/2019 03/09/2019 03/08/2019  Glucose 70 - 99 mg/dL 89 127(H) 209(H)  BUN 8 - 23 mg/dL 26(H) 16 15  Creatinine 0.61 - 1.24 mg/dL 1.72(H) 1.79(H) 1.82(H)  Sodium 135 - 145 mmol/L 138 138 136  Potassium 3.5 - 5.1 mmol/L 3.9 4.1 4.4  Chloride 98 - 111 mmol/L 102 102 104  CO2 22 - 32 mmol/L 26 25  23   Calcium 8.9 - 10.3 mg/dL 9.6 9.0 8.4(L)  Total Protein 6.5 - 8.1 g/dL 8.2(H) 7.1 -  Total Bilirubin 0.3 - 1.2 mg/dL 0.7 0.7 -  Alkaline Phos 38 - 126 U/L 72 85 -  AST 15 - 41 U/L 24 21 -  ALT 0 - 44 U/L 23 22 -    Preferred Learning Style:  No preference indicated   Learning Readiness:   Ready  Change in progress   MEDICATIONS:    DIETARY INTAKE:  24-hr recall:  B ( AM): Fiber one cereal, 1 cup milk, gala apple and boiled egg,water Snk ( AM):  L ( PM): skipping lunch often; or can salmon or veggies. water Snk ( PM): D ( PM):  Baked ribs, turnip greens and 1/2 baked potato,  water  Snk ( PM):  Beverages: water  Usual physical activity: ADL  Estimated energy needs: 1800 calories 200 g carbohydrates 135 g protein 50 g fat  Progress Towards Goal(s):  In progress.   Nutritional Diagnosis:  NB-1.1 Food and nutrition-related knowledge deficit As related to Diabetes and OBesity.  As evidenced by A1C 8.9% and BMI 44.    Intervention:  Nutrition and Diabetes education provided on My  Plate, CHO counting, meal planning, portion sizes, timing of meals, avoiding snacks between meals unless having a low blood sugar, target ranges for A1C and blood sugars, signs/symptoms and treatment of hyper/hypoglycemia, monitoring blood sugars, taking medications as prescribed, benefits of exercising 30 minutes per day and prevention of complications of DM. Marland Kitchen Past Goals  Follow MY Plate-more vegetables.--improved Walk treadmill 20 minutes twice a day-still working on. Cut back on the alcohol-improved Cut out using salt and processed foods and increase herbs and spices.- still work in progress Cut out grease-- improved Increase high fiber foods.-imptob Lose 1 per week Goal BS less than 140-150 before breakfast and supper. Don't skip lunch. Get A1C down to 7%   New Goals:   Teaching Method Utilized:  Visual Auditory Hands on  Handouts given during visit include:  The  Plate Method  Meal Plan Card  Diabetes Instructions   Barriers to learning/adherence to lifestyle change: none  Demonstrated degree of understanding via:  Teach Back   Monitoring/Evaluation:  Dietary intake, exercise,  and body weight in 3 month(s).

## 2019-09-08 DIAGNOSIS — Z23 Encounter for immunization: Secondary | ICD-10-CM | POA: Diagnosis not present

## 2019-09-12 DIAGNOSIS — E782 Mixed hyperlipidemia: Secondary | ICD-10-CM | POA: Diagnosis not present

## 2019-09-12 DIAGNOSIS — I10 Essential (primary) hypertension: Secondary | ICD-10-CM | POA: Diagnosis not present

## 2019-09-12 DIAGNOSIS — E1142 Type 2 diabetes mellitus with diabetic polyneuropathy: Secondary | ICD-10-CM | POA: Diagnosis not present

## 2019-11-15 ENCOUNTER — Other Ambulatory Visit: Payer: Self-pay

## 2019-11-15 ENCOUNTER — Encounter: Payer: Medicare Other | Attending: Family Medicine | Admitting: Nutrition

## 2019-11-15 ENCOUNTER — Encounter: Payer: Self-pay | Admitting: Nutrition

## 2019-11-15 VITALS — Ht 70.0 in | Wt 311.0 lb

## 2019-11-15 DIAGNOSIS — Z6841 Body Mass Index (BMI) 40.0 and over, adult: Secondary | ICD-10-CM

## 2019-11-15 DIAGNOSIS — E1169 Type 2 diabetes mellitus with other specified complication: Secondary | ICD-10-CM | POA: Diagnosis present

## 2019-11-15 DIAGNOSIS — N183 Chronic kidney disease, stage 3 unspecified: Secondary | ICD-10-CM

## 2019-11-15 DIAGNOSIS — I10 Essential (primary) hypertension: Secondary | ICD-10-CM

## 2019-11-15 DIAGNOSIS — E785 Hyperlipidemia, unspecified: Secondary | ICD-10-CM

## 2019-11-15 NOTE — Patient Instructions (Addendum)
Goal  Look into joining [employer] and getting a trainer to develop a workout routine. Talk to Dr about referral to kidney specialist Talk to Dr about your labs next visit. Cut out processed meats and salty foods Increase cooking at home and cut down on eating out. Watch portions and better balanced meats Cut out late night snacks. Get A1C to 6.5% or less.

## 2019-11-15 NOTE — Progress Notes (Signed)
Medical Nutrition Therapy:  Appt start time: 0830 end time:  0900 Assessment:  Primary concerns today: DIabetes Type 2, LIves with wife.  Has been trying to do yardwork and a little walking. Has been eating out more and not cooking at months. Wears dentures. 70/ 30 units 16 units BID. FBS: 80-130's.  Would benefit from referral to a nephrologist since is CKD is more chronic vs acute. Results for Gregory Lindsey, Gregory Lindsey (MRN [id number]) as of 08/15/2019 08:14  Ref. Range 07/19/2019 09:58  Creatinine Latest Ref Range: 0.61 - 1.24 mg/dL 1.72 (H)  Calcium Latest Ref Range: 8.9 - 10.3 mg/dL 9.6  Anion gap Latest Ref Range: 5 - 15  10  Alkaline Phosphatase Latest Ref Range: 38 - 126 U/L 72  Albumin Latest Ref Range: 3.5 - 5.0 g/dL 4.6  AST Latest Ref Range: 15 - 41 U/L 24  ALT Latest Ref Range: 0 - 44 U/L 23  Total Protein Latest Ref Range: 6.5 - 8.1 g/dL 8.2 (H)  Total Bilirubin Latest Ref Range: 0.3 - 1.2 mg/dL 0.7  GFR, Est Non African American Latest Ref Range: >60 mL/min 41 (L)  GFR, Est African American Latest Ref Range: >60 mL/min 47 (L)      Admits to needing to eat more fresh fruits, vegetables. Wants to get an air fryer. Saw Dr. Karie Kirks in Dec 2020.Marland Kitchen  TCHOl 234 mg,, TG 185 mg/dl, LDL 135 mg/dl. Glu 223 mg/sl, . Lab Results  Component Value Date   HGBA1C 9.8 (H) 02/22/2019   CMP Latest Ref Rng & Units 07/19/2019 03/09/2019 03/08/2019  Glucose 70 - 99 mg/dL 89 127(H) 209(H)  BUN 8 - 23 mg/dL 26(H) 16 15  Creatinine 0.61 - 1.24 mg/dL 1.72(H) 1.79(H) 1.82(H)  Sodium 135 - 145 mmol/L 138 138 136  Potassium 3.5 - 5.1 mmol/L 3.9 4.1 4.4  Chloride 98 - 111 mmol/L 102 102 104  CO2 22 - 32 mmol/L 26 25 23   Calcium 8.9 - 10.3 mg/dL 9.6 9.0 8.4(L)  Total Protein 6.5 - 8.1 g/dL 8.2(H) 7.1 -  Total Bilirubin 0.3 - 1.2 mg/dL 0.7 0.7 -  Alkaline Phos 38 - 126 U/L 72 85 -  AST 15 - 41 U/L 24 21 -  ALT 0 - 44 U/L 23 22 -    Preferred Learning Style:  No preference indicated   Learning  Readiness:   Ready  Change in progress   MEDICATIONS:    DIETARY INTAKE:  24-hr recall:  B ( AM): Fiber one cereal, 1 cup milk, gala apple and boiled egg,water Snk ( AM):  L ( PM): skipping lunch often; or can salmon or veggies. water Snk ( PM): D ( PM):  Baked ribs, turnip greens and 1/2 baked potato,  water  Snk ( PM):  Beverages: water  Usual physical activity: ADL  Estimated energy needs: 1800 calories 200 g carbohydrates 135 g protein 50 g fat  Progress Towards Goal(s):  In progress.   Nutritional Diagnosis:  NB-1.1 Food and nutrition-related knowledge deficit As related to Diabetes and OBesity.  As evidenced by A1C 8.9% and BMI 44.    Intervention:  Nutrition and Diabetes education provided on My Plate, CHO counting, meal planning, portion sizes, timing of meals, avoiding snacks between meals unless having a low blood sugar, target ranges for A1C and blood sugars, signs/symptoms and treatment of hyper/hypoglycemia, monitoring blood sugars, taking medications as prescribed, benefits of exercising 30 minutes per day and prevention of complications of DM. Marland Kitchen Past Goals  Goal  Look into joining [employer] and getting a trainer to develop a workout routine. Talk to Dr about referral to kidney specialist Talk to Dr about your labs next visit. Cut out processed meats and salty foods Increase cooking at home and cut down on eating out. Watch portions and better balanced meats Cut out late night snacks. Get A1C to 6.5% or less.  New Goals:   Teaching Method Utilized:  Visual Auditory Hands on  Handouts given during visit include:  The Plate Method  Meal Plan Card  Diabetes Instructions   Barriers to learning/adherence to lifestyle change: none  Demonstrated degree of understanding via:  Teach Back   Monitoring/Evaluation:  Dietary intake, exercise,  and body weight in 3 month(s). He would benefit from seeing a Nephrologist and Endocrinologist.

## 2019-12-13 ENCOUNTER — Encounter: Payer: Self-pay | Admitting: Nutrition

## 2019-12-14 DIAGNOSIS — E1165 Type 2 diabetes mellitus with hyperglycemia: Secondary | ICD-10-CM | POA: Diagnosis not present

## 2019-12-14 DIAGNOSIS — E1142 Type 2 diabetes mellitus with diabetic polyneuropathy: Secondary | ICD-10-CM | POA: Diagnosis not present

## 2019-12-14 DIAGNOSIS — R944 Abnormal results of kidney function studies: Secondary | ICD-10-CM | POA: Diagnosis not present

## 2019-12-14 DIAGNOSIS — D649 Anemia, unspecified: Secondary | ICD-10-CM | POA: Diagnosis not present

## 2019-12-14 DIAGNOSIS — Z125 Encounter for screening for malignant neoplasm of prostate: Secondary | ICD-10-CM | POA: Diagnosis not present

## 2019-12-14 DIAGNOSIS — I10 Essential (primary) hypertension: Secondary | ICD-10-CM | POA: Diagnosis not present

## 2019-12-14 DIAGNOSIS — E162 Hypoglycemia, unspecified: Secondary | ICD-10-CM | POA: Diagnosis not present

## 2019-12-14 DIAGNOSIS — F649 Gender identity disorder, unspecified: Secondary | ICD-10-CM | POA: Diagnosis not present

## 2019-12-14 DIAGNOSIS — E119 Type 2 diabetes mellitus without complications: Secondary | ICD-10-CM | POA: Diagnosis not present

## 2019-12-14 DIAGNOSIS — E781 Pure hyperglyceridemia: Secondary | ICD-10-CM | POA: Diagnosis not present

## 2019-12-14 DIAGNOSIS — E782 Mixed hyperlipidemia: Secondary | ICD-10-CM | POA: Diagnosis not present

## 2020-01-23 ENCOUNTER — Inpatient Hospital Stay (HOSPITAL_COMMUNITY): Payer: Medicare Other | Attending: Hematology

## 2020-01-23 ENCOUNTER — Ambulatory Visit (HOSPITAL_COMMUNITY)
Admission: RE | Admit: 2020-01-23 | Discharge: 2020-01-23 | Disposition: A | Discharge status: Home / Self Care | Payer: Medicare Other | Source: Ambulatory Visit | Attending: Hematology | Admitting: Hematology

## 2020-01-23 ENCOUNTER — Other Ambulatory Visit: Payer: Self-pay

## 2020-01-23 DIAGNOSIS — Z87891 Personal history of nicotine dependence: Secondary | ICD-10-CM | POA: Diagnosis not present

## 2020-01-23 DIAGNOSIS — I2510 Atherosclerotic heart disease of native coronary artery without angina pectoris: Secondary | ICD-10-CM | POA: Diagnosis not present

## 2020-01-23 DIAGNOSIS — E119 Type 2 diabetes mellitus without complications: Secondary | ICD-10-CM | POA: Insufficient documentation

## 2020-01-23 DIAGNOSIS — I10 Essential (primary) hypertension: Secondary | ICD-10-CM | POA: Diagnosis not present

## 2020-01-23 DIAGNOSIS — C3491 Malignant neoplasm of unspecified part of right bronchus or lung: Secondary | ICD-10-CM | POA: Insufficient documentation

## 2020-01-23 DIAGNOSIS — Z79899 Other long term (current) drug therapy: Secondary | ICD-10-CM | POA: Diagnosis not present

## 2020-01-23 DIAGNOSIS — J432 Centrilobular emphysema: Secondary | ICD-10-CM | POA: Diagnosis not present

## 2020-01-23 DIAGNOSIS — I700 Atherosclerosis of aorta: Secondary | ICD-10-CM | POA: Diagnosis not present

## 2020-01-23 LAB — CBC WITH DIFFERENTIAL/PLATELET
Abs Immature Granulocytes: 0.02 10*3/uL (ref 0.00–0.07)
Basophils Absolute: 0 10*3/uL (ref 0.0–0.1)
Basophils Relative: 1 %
Eosinophils Absolute: 0.1 10*3/uL (ref 0.0–0.5)
Eosinophils Relative: 1 %
HCT: 38.7 % — ABNORMAL LOW (ref 39.0–52.0)
Hemoglobin: 12.2 g/dL — ABNORMAL LOW (ref 13.0–17.0)
Immature Granulocytes: 0 %
Lymphocytes Relative: 29 %
Lymphs Abs: 2.2 10*3/uL (ref 0.7–4.0)
MCH: 28.6 pg (ref 26.0–34.0)
MCHC: 31.5 g/dL (ref 30.0–36.0)
MCV: 90.6 fL (ref 80.0–100.0)
Monocytes Absolute: 0.5 10*3/uL (ref 0.1–1.0)
Monocytes Relative: 7 %
Neutro Abs: 4.9 10*3/uL (ref 1.7–7.7)
Neutrophils Relative %: 62 %
Platelets: 222 10*3/uL (ref 150–400)
RBC: 4.27 MIL/uL (ref 4.22–5.81)
RDW: 13.5 % (ref 11.5–15.5)
WBC: 7.8 10*3/uL (ref 4.0–10.5)
nRBC: 0 % (ref 0.0–0.2)

## 2020-01-23 LAB — COMPREHENSIVE METABOLIC PANEL
ALT: 23 U/L (ref 0–44)
AST: 23 U/L (ref 15–41)
Albumin: 4.1 g/dL (ref 3.5–5.0)
Alkaline Phosphatase: 66 U/L (ref 38–126)
Anion gap: 10 (ref 5–15)
BUN: 25 mg/dL — ABNORMAL HIGH (ref 8–23)
CO2: 23 mmol/L (ref 22–32)
Calcium: 9.2 mg/dL (ref 8.9–10.3)
Chloride: 105 mmol/L (ref 98–111)
Creatinine, Ser: 1.41 mg/dL — ABNORMAL HIGH (ref 0.61–1.24)
GFR calc Af Amer: 60 mL/min — ABNORMAL LOW (ref >60)
GFR calc non Af Amer: 52 mL/min — ABNORMAL LOW (ref >60)
Glucose, Bld: 91 mg/dL (ref 70–99)
Potassium: 3.8 mmol/L (ref 3.5–5.1)
Sodium: 138 mmol/L (ref 135–145)
Total Bilirubin: 1 mg/dL (ref 0.3–1.2)
Total Protein: 7.7 g/dL (ref 6.5–8.1)

## 2020-01-23 MED ORDER — IOHEXOL 300 MG/ML  SOLN
75.0000 mL | Freq: Once | INTRAMUSCULAR | Status: AC | PRN
  Administered 2020-01-23: 75 mL via INTRAVENOUS

## 2020-01-30 ENCOUNTER — Inpatient Hospital Stay (HOSPITAL_COMMUNITY): Payer: Medicare Other | Admitting: Hematology

## 2020-02-05 ENCOUNTER — Other Ambulatory Visit: Payer: Self-pay

## 2020-02-05 ENCOUNTER — Inpatient Hospital Stay (HOSPITAL_BASED_OUTPATIENT_CLINIC_OR_DEPARTMENT_OTHER): Payer: Medicare Other | Admitting: Hematology

## 2020-02-05 VITALS — BP 146/81 | HR 95 | Temp 97.1°F | Resp 18 | Wt 300.6 lb

## 2020-02-05 DIAGNOSIS — I10 Essential (primary) hypertension: Secondary | ICD-10-CM | POA: Diagnosis not present

## 2020-02-05 DIAGNOSIS — Z79899 Other long term (current) drug therapy: Secondary | ICD-10-CM | POA: Diagnosis not present

## 2020-02-05 DIAGNOSIS — C3491 Malignant neoplasm of unspecified part of right bronchus or lung: Secondary | ICD-10-CM

## 2020-02-05 DIAGNOSIS — E119 Type 2 diabetes mellitus without complications: Secondary | ICD-10-CM | POA: Diagnosis not present

## 2020-02-05 DIAGNOSIS — Z87891 Personal history of nicotine dependence: Secondary | ICD-10-CM | POA: Diagnosis not present

## 2020-02-05 NOTE — Patient Instructions (Signed)
[place] at [facility] Discharge Instructions  You were seen today by Dr. Delton Coombes. He went over your recent results and scans. Drink plenty of water daily, at least 60 ounces. Dr. Delton Coombes will see you back in 6 months for labs and follow up.   Thank you for choosing [place] at [facility] to provide your oncology and hematology care.  To afford each patient quality time with our provider, please arrive at least 15 minutes before your scheduled appointment time.   If you have a lab appointment with the [facility] please come in thru the Main Entrance and check in at the main information desk  You need to re-schedule your appointment should you arrive 10 or more minutes late.  We strive to give you quality time with our providers, and arriving late affects you and other patients whose appointments are after yours.  Also, if you no show three or more times for appointments you may be dismissed from the clinic at the providers discretion.     Again, thank you for choosing [facility].  Our hope is that these requests will decrease the amount of time that you wait before being seen by our physicians.       _____________________________________________________________  Should you have questions after your visit to [facility], please contact our office at [phone] between the hours of 8:00 a.m. and 4:30 p.m.  Voicemails left after 4:00 p.m. will not be returned until the following business day.  For prescription refill requests, have your pharmacy contact our office and allow 72 hours.    Cancer Center Support Programs:   > Cancer Support Group  2nd Tuesday of the month 1pm-2pm, Journey Room

## 2020-02-05 NOTE — Progress Notes (Signed)
[place], [place] [address]   CLINIC:  Medical Oncology/Hematology  PCP:  Lemmie Evens, MD [place]. / [place] [address] [phone]   REASON FOR VISIT:  Follow-up for right lung adenocarcinoma  PRIOR THERAPY: Right lobectomy and lymph node biopsy on 03/07/2019  NGS Results: Not done  CURRENT THERAPY: Observation  BRIEF ONCOLOGIC HISTORY:  Oncology History  Adenocarcinoma of right lung, stage 1 ([place])  03/22/2019 Initial Diagnosis   Adenocarcinoma of right lung, stage 1 ([place])   03/22/2019 Cancer Staging   Staging form: Lung, AJCC 8th Edition - Clinical: cT1, cN0, cM0 - Signed by Derek Jack, MD on 03/22/2019     CANCER STAGING: Cancer Staging Adenocarcinoma of right lung, stage 1 ([place]) Staging form: Lung, AJCC 8th Edition - Clinical: cT1, cN0, cM0 - Signed by Derek Jack, MD on 03/22/2019   INTERVAL HISTORY:  [name], a 69 y.o. male, returns for routine follow-up of his right lung adenocarcinoma. Gregory Lindsey was last seen on 07/26/2019.   Today he reports feeling well. He denies having CP, SOB, new coughs or hemoptysis. He has started working on the weekends. His neuropathy in his feet is stable.   REVIEW OF SYSTEMS:  Review of Systems  Constitutional: Positive for fatigue (mild). Negative for appetite change.  Neurological: Positive for numbness (feet).    PAST MEDICAL/SURGICAL HISTORY:  Past Medical History:  Diagnosis Date  . AKI (acute kidney injury) ([place]) 02/2019  . Diabetes mellitus   . Hypercholesterolemia   . Hypertension   . Pneumonia    Past Surgical History:  Procedure Laterality Date  . CHOLECYSTECTOMY N/A 01/22/2016   Procedure: LAPAROSCOPIC CHOLECYSTECTOMY;  Surgeon: Aviva Signs, MD;  Location: [facility];  Service: General;  Laterality: N/A;  . COLONOSCOPY  2007 SLF   1 CM SIMPLE ADENOMA  . COLONOSCOPY N/A 04/26/2015   Procedure: COLONOSCOPY;  Surgeon: Danie Binder, MD;  [facility];  Service: Endoscopy;  Laterality: N/A;  930   . VIDEO ASSISTED THORACOSCOPY (VATS)/ LOBECTOMY Right 03/07/2019   Procedure: VIDEO ASSISTED THORACOSCOPY (VATS)/RIGHT LOWER LOBECTOMY;  Surgeon: Lajuana Matte, MD;  Location: [facility];  Service: Thoracic;  Laterality: Right;  Marland Kitchen VIDEO BRONCHOSCOPY N/A 03/07/2019   Procedure: VIDEO BRONCHOSCOPY;  Surgeon: Lajuana Matte, MD;  Location: [facility];  Service: Thoracic;  Laterality: N/A;  . VIDEO BRONCHOSCOPY WITH ENDOBRONCHIAL NAVIGATION N/A 02/22/2019   Procedure: VIDEO BRONCHOSCOPY WITH ENDOBRONCHIAL NAVIGATION WITH BIOPIES;  Surgeon: Lajuana Matte, MD;  [facility];  Service: Thoracic;  Laterality: N/A;    SOCIAL HISTORY:  Social History   Socioeconomic History  . Marital status: Married    Spouse name: Not on file  . Number of children: Not on file  . Years of education: Not on file  . Highest education level: Not on file  Occupational History  . Occupation: retired  Tobacco Use  . Smoking status: Former Research scientist (life sciences)  . Smokeless tobacco: Never Used  Vaping Use  . Vaping Use: Never used  Substance and Sexual Activity  . Alcohol use: Yes    Alcohol/week: 0.0 standard drinks    Comment: beer 2-3 times per week, 2 shots of scotch on saturday night , rarely since June  . Drug use: No  . Sexual activity: Not Currently  Other Topics Concern  . Not on file  Social History Narrative  . Not on file   Social Determinants of Health   Financial  Resource Strain:   . Difficulty of Paying Living Expenses: Not on file  Food Insecurity:   . Worried About Charity fundraiser in the Last Year: Not on file  . Ran Out of Food in the Last Year: Not on file  Transportation Needs:   . Lack of Transportation (Medical): Not on file  . Lack of Transportation (Non-Medical): Not on file  Physical Activity:   . Days of Exercise per Week: Not on file  . Minutes of Exercise per Session: Not on file  Stress:     . Feeling of Stress : Not on file  Social Connections:   . Frequency of Communication with Friends and Family: Not on file  . Frequency of Social Gatherings with Friends and Family: Not on file  . Attends Religious Services: Not on file  . Active Member of Clubs or Organizations: Not on file  . Attends Archivist Meetings: Not on file  . Marital Status: Not on file  Intimate Partner Violence:   . Fear of Current or Ex-Partner: Not on file  . Emotionally Abused: Not on file  . Physically Abused: Not on file  . Sexually Abused: Not on file    FAMILY HISTORY:  Family History  Problem Relation Age of Onset  . Cancer Paternal Grandfather   . Colon cancer Neg Hx     CURRENT MEDICATIONS:  Current Outpatient Medications  Medication Sig Dispense Refill  . amLODipine (NORVASC) 10 MG tablet Take 1 tablet (10 mg total) by mouth daily. 30 tablet 3  . aspirin 81 MG tablet Take 81 mg by mouth every evening.     . blood glucose meter kit and supplies KIT Dispense based on patient and insurance preference. Use up to four times daily as directed. (FOR ICD-9 250.00, 250.01). 1 each 0  . cyanocobalamin 2000 MCG tablet Take 2,000 mcg by mouth 2 (two) times daily.     . ferrous sulfate (IRON SUPPLEMENT) 325 (65 FE) MG tablet Take 325 mg by mouth 2 (two) times daily.      . insulin aspart protamine- aspart (NOVOLOG MIX 70/30) (70-30) 100 UNIT/ML injection Inject 0.08 mLs (8 Units total) into the skin 2 (two) times daily with a meal. (Patient taking differently: Inject 16 Units into the skin 2 (two) times daily with a meal. ) 10 mL 11  . metoprolol succinate (TOPROL-XL) 25 MG 24 hr tablet Take 25 mg by mouth daily.    . Multiple Vitamin (MULTIVITAMIN WITH MINERALS) TABS tablet Take 1 tablet by mouth daily.    . pravastatin (PRAVACHOL) 40 MG tablet Take 40 mg by mouth daily.     . traMADol (ULTRAM) 50 MG tablet Take 1-2 tablets (50-100 mg total) by mouth every 6 (six) hours as needed (mild  pain). 30 tablet 0   No current facility-administered medications for this visit.    ALLERGIES:  No Known Allergies  PHYSICAL EXAM:  Performance status (ECOG): 1 - Symptomatic but completely ambulatory  Vitals:   02/05/20 1453  BP: (!) 146/81  Pulse: 95  Resp: 18  Temp: (!) 97.1 F (36.2 C)  SpO2: 99%   Wt Readings from Last 3 Encounters:  02/05/20 (!) 300 lb 9.6 oz (136.4 kg)  11/15/19 (!) 311 lb (141.1 kg)  08/15/19 (!) 308 lb 14.4 oz (140.1 kg)   Physical Exam Vitals reviewed.  Constitutional:      Appearance: Normal appearance. He is obese.  Cardiovascular:     Rate and Rhythm: Normal  rate and regular rhythm.     Pulses: Normal pulses.     Heart sounds: Normal heart sounds.  Pulmonary:     Effort: Pulmonary effort is normal.     Breath sounds: Normal breath sounds.  Abdominal:     Palpations: Abdomen is soft. There is no mass.     Tenderness: There is no abdominal tenderness.  Musculoskeletal:     Right lower leg: No edema.     Left lower leg: No edema.  Lymphadenopathy:     Cervical: No cervical adenopathy.     Upper Body:     Right upper body: No supraclavicular or axillary adenopathy.     Left upper body: No supraclavicular or axillary adenopathy.  Neurological:     General: No focal deficit present.     Mental Status: He is alert and oriented to person, place, and time.  Psychiatric:        Mood and Affect: Mood normal.        Behavior: Behavior normal.      LABORATORY DATA:  I have reviewed the labs as listed.  CBC Latest Ref Rng & Units 01/23/2020 07/19/2019 03/09/2019  WBC 4.0 - 10.5 K/uL 7.8 9.8 16.4(H)  Hemoglobin 13.0 - 17.0 g/dL 12.2(L) 12.0(L) 10.2(L)  Hematocrit 39 - 52 % 38.7(L) 39.3 31.1(L)  Platelets 150 - 400 K/uL 222 295 246   CMP Latest Ref Rng & Units 01/23/2020 07/19/2019 03/09/2019  Glucose 70 - 99 mg/dL 91 89 127(H)  BUN 8 - 23 mg/dL 25(H) 26(H) 16  Creatinine 0.61 - 1.24 mg/dL 1.41(H) 1.72(H) 1.79(H)  Sodium 135 - 145 mmol/L 138  138 138  Potassium 3.5 - 5.1 mmol/L 3.8 3.9 4.1  Chloride 98 - 111 mmol/L 105 102 102  CO2 22 - 32 mmol/L _0 Calcium 8.9 - 10.3 mg/dL 9.2 9.6 9.0  Total Protein 6.5 - 8.1 g/dL 7.7 8.2(H) 7.1  Total Bilirubin 0.3 - 1.2 mg/dL 1.0 0.7 0.7  Alkaline Phos 38 - 126 U/L 66 72 85  AST 15 - 41 U/L _1 ALT 0 - 44 U/L _2 Lab Results  Component Value Date   LDH 139 07/19/2019   Lab Results  Component Value Date   VD25OH 32.69 07/19/2019    DIAGNOSTIC IMAGING:  I have independently reviewed the scans and discussed with the patient. CT Chest W Contrast  Result Date: 01/23/2020 CLINICAL DATA:  Follow-up right lung adenocarcinoma, status post right lower lobectomy EXAM: CT CHEST WITH CONTRAST TECHNIQUE: Multidetector CT imaging of the chest was performed during intravenous contrast administration. CONTRAST:  66m OMNIPAQUE IOHEXOL 300 MG/ML  SOLN COMPARISON:  07/19/2019 FINDINGS: Cardiovascular: Aortic atherosclerosis. Normal heart size. Left coronary artery calcifications. No pericardial effusion. Mediastinum/Nodes: No enlarged mediastinal, hilar, or axillary lymph nodes. Thyroid gland, trachea, and esophagus demonstrate no significant findings. Lungs/Pleura: Minimal centrilobular emphysema. Status post right lower lobectomy. Occasional tiny pulmonary nodules are stable and almost certainly benign and incidental, for example a 2 mm nodule in the peripheral left upper lobe (series 4, image 40). No pleural effusion or pneumothorax. Upper Abdomen: No acute abnormality.  Status post cholecystectomy. Musculoskeletal: No chest wall mass or suspicious bone lesions identified. IMPRESSION: 1. Status post right lower lobectomy. No evidence of recurrent or metastatic disease in the chest. 2. Minimal emphysema. Emphysema (ICD10-J43.9). 3. Coronary artery disease. Aortic Atherosclerosis (ICD10-I70.0). Electronically Signed   By: AEddie CandleM.D.   On: 01/23/2020 16:20  ASSESSMENT:  1.  Right  lung adenocarcinoma (PT1CPN0): -Incidental right lung mass on a CT CAP done for abdominal pain. -Quit smoking 19 years ago, smoked 1/4 pack of cigarettes a day for 18 years followed by cigars and pipes for 10 years. - PET scan on 01/16/2019 shows 1.5 x 2.4 cm right lower lobe mass with SUV 3.2 with no adenopathy or metastatic disease. -MRI of the brain on 01/03/2019 was negative for metastatic disease. - He underwent right lobectomy and lymph node biopsy on 03/07/2019 which showed 2.3 cm adenocarcinoma, acinar predominant.  0/8 lymph nodes from level 7, 11, 9, 12 were negative.  Margins were negative.  No visceral pleural invasion.  No lymphovascular invasion. -CT chest on 01/23/2020 shows right lower lobectomy.  No evidence of recurrent or metastatic disease in the chest.  PLAN:  1.  Right lung adenocarcinoma (PT1CPN0): -He does not report any chest pains or hemoptysis.  He reports that he has been working part-time over the weekends. -We reviewed CT scan of the chest from 01/23/2020 which did not show any evidence of recurrence. -We will reevaluate him in 6 months with repeat labs.  2.  Diabetic neuropathy: -Numbness in the feet has been stable.    Orders placed this encounter:  No orders of the defined types were placed in this encounter.    Derek Jack, MD [place] [phone]   I, Milinda Antis, am acting as a scribe for Dr. Sanda Linger.  I, Derek Jack MD, have reviewed the above documentation for accuracy and completeness, and I agree with the above.

## 2020-02-20 ENCOUNTER — Other Ambulatory Visit: Payer: Self-pay

## 2020-02-20 ENCOUNTER — Encounter: Payer: Medicare Other | Attending: "Endocrinology | Admitting: Nutrition

## 2020-02-20 ENCOUNTER — Encounter: Payer: Self-pay | Admitting: Nutrition

## 2020-02-20 VITALS — Ht 69.0 in | Wt 307.8 lb

## 2020-02-20 DIAGNOSIS — N183 Chronic kidney disease, stage 3 unspecified: Secondary | ICD-10-CM

## 2020-02-20 DIAGNOSIS — E1165 Type 2 diabetes mellitus with hyperglycemia: Secondary | ICD-10-CM | POA: Diagnosis not present

## 2020-02-20 DIAGNOSIS — E785 Hyperlipidemia, unspecified: Secondary | ICD-10-CM | POA: Diagnosis not present

## 2020-02-20 DIAGNOSIS — I10 Essential (primary) hypertension: Secondary | ICD-10-CM | POA: Diagnosis not present

## 2020-02-20 DIAGNOSIS — Z6841 Body Mass Index (BMI) 40.0 and over, adult: Secondary | ICD-10-CM | POA: Diagnosis not present

## 2020-02-20 DIAGNOSIS — IMO0002 Reserved for concepts with insufficient information to code with codable children: Secondary | ICD-10-CM

## 2020-02-20 DIAGNOSIS — E118 Type 2 diabetes mellitus with unspecified complications: Secondary | ICD-10-CM | POA: Insufficient documentation

## 2020-02-20 NOTE — Progress Notes (Signed)
Medical Nutrition Therapy:  Appt start time: 0830 end time:  0900 Assessment:  Primary concerns today: DIabetes Type 2, LIves with wife. A1C 5.7% down from 8.6% He is doing sercuity now and gets in more walking.Walking with a cane.  Novolin 70/30 insulin was on 16 units twice an now down to 12 units twice a day.  BS 97-150 mg/dl.  He has cut out eating out and eating  smaller portions.  Cutting out snacks.  Blood sugars 104-140's. Feels better. Has more energy. He notes he has had a few times when he feels a little shaky and eats a pack of nabs or something when he feels his BS drop. He had lung surgery November 2020. CKD has improved from 1.72 to 1.4 with eGFR now > 60 when it was 45 before. Has cut out a lot of high salt and processed foods. Wears dentures.  Would benefit from referral to a nephrologist since is CKD is more chronic vs acute. Also would benefit from a GLP for needed weight loss.   Results for MENDEL, BINSFELD (MRN [id number]) as of 08/15/2019 08:14  Ref. Range 07/19/2019 09:58  Creatinine Latest Ref Range: 0.61 - 1.24 mg/dL 1.72 (H)  Calcium Latest Ref Range: 8.9 - 10.3 mg/dL 9.6  Anion gap Latest Ref Range: 5 - 15  10  Alkaline Phosphatase Latest Ref Range: 38 - 126 U/L 72  Albumin Latest Ref Range: 3.5 - 5.0 g/dL 4.6  AST Latest Ref Range: 15 - 41 U/L 24  ALT Latest Ref Range: 0 - 44 U/L 23  Total Protein Latest Ref Range: 6.5 - 8.1 g/dL 8.2 (H)  Total Bilirubin Latest Ref Range: 0.3 - 1.2 mg/dL 0.7  GFR, Est Non African American Latest Ref Range: >60 mL/min 41 (L)  GFR, Est African American Latest Ref Range: >60 mL/min 47 (L)    Lab Results  Component Value Date   HGBA1C 9.8 (H) 02/22/2019   CMP Latest Ref Rng & Units 01/23/2020 07/19/2019 03/09/2019  Glucose 70 - 99 mg/dL 91 89 127(H)  BUN 8 - 23 mg/dL 25(H) 26(H) 16  Creatinine 0.61 - 1.24 mg/dL 1.41(H) 1.72(H) 1.79(H)  Sodium 135 - 145 mmol/L 138 138 138  Potassium 3.5 - 5.1 mmol/L 3.8 3.9 4.1  Chloride 98 - 111  mmol/L 105 102 102  CO2 22 - 32 mmol/L _0 Calcium 8.9 - 10.3 mg/dL 9.2 9.6 9.0  Total Protein 6.5 - 8.1 g/dL 7.7 8.2(H) 7.1  Total Bilirubin 0.3 - 1.2 mg/dL 1.0 0.7 0.7  Alkaline Phos 38 - 126 U/L 66 72 85  AST 15 - 41 U/L _1 ALT 0 - 44 U/L _2 Preferred Learning Style:  No preference indicated   Learning Readiness:   Ready  Change in progress   MEDICATIONS:    DIETARY INTAKE:  24-hr recall:  B ( AM): Fiber one cereal, 1 cup milk, gala apple and boiled egg,water Snk ( AM):  L ( PM): Spring salad, strawberries, nuts, vinegarettte, Water or cy Snk ( PM): D ( PM): Northern beans, [place] and green beans,  Snk ( PM):  Beverages: water  Usual physical activity: ADL  Estimated energy needs: 1800 calories 200 g carbohydrates 135 g protein 50 g fat  Progress Towards Goal(s):  In progress.   Nutritional Diagnosis:  NB-1.1 Food and nutrition-related knowledge deficit As related to Diabetes and OBesity.  As evidenced by A1C 8.9% and BMI 44.  Intervention:  Nutrition and Diabetes education provided on My Plate, CHO counting, meal planning, portion sizes, timing of meals, avoiding snacks between meals unless having a low blood sugar, target ranges for A1C and blood sugars, signs/symptoms and treatment of hyper/hypoglycemia, monitoring blood sugars, taking medications as prescribed, benefits of exercising 30 minutes per day and prevention of complications of DM. . Goals Keep p the GREAT JOB!!!!  A1C of 5.7%!!!! Increase exercise  30 minutes 3-4 times per week Avoid snacks of  Nabs  Eat 45 g CHO at meals Avoid salty foods for kidneys Lose 1-2 lbs per week Try UTUBE videos for strength exercise and chair exercises.   Teaching Method Utilized:  Visual Auditory Hands on  Handouts given during visit include:  The Plate Method  Meal Plan Card  Diabetes Instructions   Barriers to learning/adherence to lifestyle change: none  Demonstrated  degree of understanding via:  Teach Back   Monitoring/Evaluation:  Dietary intake, exercise,  and body weight in 3 month(s). He would benefit from seeing a Nephrologist and Endocrinologist. Recommend to consider using a long acting insulin daily instead of a mixed dose and adding a GLP 1 (Ozempic or Truliciity)  for needed weight loss. 

## 2020-02-20 NOTE — Patient Instructions (Addendum)
Goals Keep p the GREAT JOB!!!!  A1C of 5.7%!!!! Increase exercise  30 minutes 3-4 times per week Avoid snacks of  Nabs  Eat 45 g CHO at meals Avoid salty foods for kidneys Lose 1-2 lbs per week Try UTUBE videos for strength exercise and chair exercises.

## 2020-03-13 DIAGNOSIS — M25561 Pain in right knee: Secondary | ICD-10-CM | POA: Diagnosis not present

## 2020-03-13 DIAGNOSIS — Z23 Encounter for immunization: Secondary | ICD-10-CM | POA: Diagnosis not present

## 2020-03-13 DIAGNOSIS — E1142 Type 2 diabetes mellitus with diabetic polyneuropathy: Secondary | ICD-10-CM | POA: Diagnosis not present

## 2020-03-13 DIAGNOSIS — I10 Essential (primary) hypertension: Secondary | ICD-10-CM | POA: Diagnosis not present

## 2020-03-13 DIAGNOSIS — E782 Mixed hyperlipidemia: Secondary | ICD-10-CM | POA: Diagnosis not present

## 2020-04-02 ENCOUNTER — Other Ambulatory Visit: Payer: Self-pay | Admitting: Thoracic Surgery (Cardiothoracic Vascular Surgery)

## 2020-04-02 DIAGNOSIS — C3491 Malignant neoplasm of unspecified part of right bronchus or lung: Secondary | ICD-10-CM

## 2020-04-20 DIAGNOSIS — Z23 Encounter for immunization: Secondary | ICD-10-CM | POA: Diagnosis not present

## 2020-04-25 ENCOUNTER — Other Ambulatory Visit: Payer: Medicare Other

## 2020-04-25 ENCOUNTER — Ambulatory Visit: Payer: Medicare Other | Admitting: Thoracic Surgery (Cardiothoracic Vascular Surgery)

## 2020-04-26 ENCOUNTER — Ambulatory Visit: Payer: Medicare Other | Admitting: Thoracic Surgery (Cardiothoracic Vascular Surgery)

## 2020-05-02 DIAGNOSIS — M25562 Pain in left knee: Secondary | ICD-10-CM | POA: Diagnosis not present

## 2020-05-02 DIAGNOSIS — M25461 Effusion, right knee: Secondary | ICD-10-CM | POA: Diagnosis not present

## 2020-05-02 DIAGNOSIS — M170 Bilateral primary osteoarthritis of knee: Secondary | ICD-10-CM | POA: Diagnosis not present

## 2020-05-02 DIAGNOSIS — M25361 Other instability, right knee: Secondary | ICD-10-CM | POA: Diagnosis not present

## 2020-05-02 DIAGNOSIS — M1711 Unilateral primary osteoarthritis, right knee: Secondary | ICD-10-CM | POA: Diagnosis not present

## 2020-05-02 DIAGNOSIS — M21161 Varus deformity, not elsewhere classified, right knee: Secondary | ICD-10-CM | POA: Diagnosis not present

## 2020-05-02 DIAGNOSIS — M25362 Other instability, left knee: Secondary | ICD-10-CM | POA: Diagnosis not present

## 2020-05-02 DIAGNOSIS — M25561 Pain in right knee: Secondary | ICD-10-CM | POA: Diagnosis not present

## 2020-05-02 DIAGNOSIS — M21162 Varus deformity, not elsewhere classified, left knee: Secondary | ICD-10-CM | POA: Diagnosis not present

## 2020-05-07 DIAGNOSIS — M25561 Pain in right knee: Secondary | ICD-10-CM | POA: Diagnosis not present

## 2020-05-07 DIAGNOSIS — M1711 Unilateral primary osteoarthritis, right knee: Secondary | ICD-10-CM | POA: Diagnosis not present

## 2020-05-08 ENCOUNTER — Encounter: Payer: Self-pay | Admitting: Internal Medicine

## 2020-05-15 DIAGNOSIS — M25561 Pain in right knee: Secondary | ICD-10-CM | POA: Diagnosis not present

## 2020-05-15 DIAGNOSIS — M1711 Unilateral primary osteoarthritis, right knee: Secondary | ICD-10-CM | POA: Diagnosis not present

## 2020-05-16 ENCOUNTER — Ambulatory Visit
Admission: RE | Admit: 2020-05-16 | Discharge: 2020-05-16 | Disposition: A | Discharge status: Home / Self Care | Payer: Medicare Other | Source: Ambulatory Visit | Attending: Thoracic Surgery (Cardiothoracic Vascular Surgery) | Admitting: Thoracic Surgery (Cardiothoracic Vascular Surgery)

## 2020-05-16 ENCOUNTER — Other Ambulatory Visit: Payer: Self-pay

## 2020-05-16 DIAGNOSIS — J432 Centrilobular emphysema: Secondary | ICD-10-CM | POA: Diagnosis not present

## 2020-05-16 DIAGNOSIS — J929 Pleural plaque without asbestos: Secondary | ICD-10-CM | POA: Diagnosis not present

## 2020-05-16 DIAGNOSIS — I2510 Atherosclerotic heart disease of native coronary artery without angina pectoris: Secondary | ICD-10-CM | POA: Diagnosis not present

## 2020-05-16 DIAGNOSIS — J984 Other disorders of lung: Secondary | ICD-10-CM | POA: Diagnosis not present

## 2020-05-16 DIAGNOSIS — C3491 Malignant neoplasm of unspecified part of right bronchus or lung: Secondary | ICD-10-CM

## 2020-05-17 ENCOUNTER — Ambulatory Visit (INDEPENDENT_AMBULATORY_CARE_PROVIDER_SITE_OTHER): Payer: Self-pay | Admitting: Thoracic Surgery (Cardiothoracic Vascular Surgery)

## 2020-05-17 ENCOUNTER — Encounter: Payer: Self-pay | Admitting: Thoracic Surgery (Cardiothoracic Vascular Surgery)

## 2020-05-17 VITALS — BP 174/89 | HR 96 | Resp 20 | Ht 69.0 in | Wt 303.2 lb

## 2020-05-17 DIAGNOSIS — Z85118 Personal history of other malignant neoplasm of bronchus and lung: Secondary | ICD-10-CM

## 2020-05-17 NOTE — Progress Notes (Signed)
[address]       [place] [address]             [phone]                    Gregory Lindsey [facility] Medical Record #[id number] Date of Birth: [birth year]  Referring: Derek Jack, MD Primary Care: Lemmie Evens, MD Primary Cardiologist: No primary care provider on file.  Chief Complaint:    Chief Complaint  Patient presents with  . Follow-up    hx of VATs for lobectomy, f/u for lesions     History of Present Illness:    Gregory Lindsey 69 y.o. male comes in for his 15month appointment.  He has no complaints.  He has been doing well      Zubrod Score: At the time of surgery this patient's most appropriate activity status/level should be described as: $RemoveBefor'[]'EUCQUAPGiKmB$     0    Normal activity, no symptoms $RemoveBef'[x]'WtNotVbCDQ$     1    Restricted in physical strenuous activity but ambulatory, able to do out light work $RemoveBe'[]'CwbawoClp$     2    Ambulatory and capable of self care, unable to do work activities, up and about               >50 % of waking hours                              '[]'$     3    Only limited self care, in bed greater than 50% of waking hours $RemoveBefo'[]'hvVDBABzccN$     4    Completely disabled, no self care, confined to bed or chair $Remove'[]'ShGQTEO$     5    Moribund   Past Medical History:  Diagnosis Date  . AKI (acute kidney injury) ([facility]) 02/2019  . Diabetes mellitus   . Hypercholesterolemia   . Hypertension   . Pneumonia     Past Surgical History:  Procedure Laterality Date  . CHOLECYSTECTOMY N/A 01/22/2016   Procedure: LAPAROSCOPIC CHOLECYSTECTOMY;  Surgeon: Aviva Signs, MD;  [facility];  Service: General;  Laterality: N/A;  . COLONOSCOPY  2007 [facility]   1 CM SIMPLE ADENOMA  . COLONOSCOPY N/A 04/26/2015   Procedure: COLONOSCOPY;  Surgeon: Danie Binder, MD;  [facility];  Service: Endoscopy;  Laterality: N/A;  930   . VIDEO ASSISTED THORACOSCOPY (VATS)/ LOBECTOMY Right 03/07/2019   Procedure: VIDEO ASSISTED THORACOSCOPY (VATS)/RIGHT LOWER LOBECTOMY;  Surgeon: Lajuana Matte, MD;  Location: [facility];  Service: Thoracic;  Laterality: Right;  Marland Kitchen VIDEO BRONCHOSCOPY N/A 03/07/2019   Procedure: VIDEO BRONCHOSCOPY;  Surgeon: Lajuana Matte, MD;  [facility];  Service: Thoracic;  Laterality: N/A;  . VIDEO BRONCHOSCOPY WITH ENDOBRONCHIAL NAVIGATION N/A 02/22/2019   Procedure: VIDEO BRONCHOSCOPY WITH ENDOBRONCHIAL NAVIGATION WITH BIOPIES;  Surgeon: Lajuana Matte, MD;  [facility];  Service: Thoracic;  Laterality: N/A;    Family History  Problem Relation Age of Onset  . Cancer Paternal Grandfather   . Colon cancer Neg Hx      Social History   Tobacco Use  Smoking Status Former Smoker  Smokeless Tobacco Never Used    Social History   Substance and Sexual Activity  Alcohol Use Yes  . Alcohol/week: 0.0 standard drinks   Comment: beer 2-3 times per week, 2 shots of scotch on saturday night , rarely since  June     No Known Allergies  Current Outpatient Medications  Medication Sig Dispense Refill  . amLODipine (NORVASC) 10 MG tablet Take 1 tablet (10 mg total) by mouth daily. 30 tablet 3  . aspirin 81 MG tablet Take 81 mg by mouth every evening.     . blood glucose meter kit and supplies KIT Dispense based on patient and insurance preference. Use up to four times daily as directed. (FOR ICD-9 250.00, 250.01). 1 each 0  . cyanocobalamin 2000 MCG tablet Take 2,000 mcg by mouth 2 (two) times daily.     . ferrous sulfate (IRON SUPPLEMENT) 325 (65 FE) MG tablet Take 325 mg by mouth 2 (two) times daily.      . insulin aspart protamine- aspart (NOVOLOG MIX 70/30) (70-30) 100 UNIT/ML injection Inject 0.08 mLs (8 Units total) into the skin 2 (two) times daily with a meal. (Patient taking differently: Inject 16 Units into the skin 2 (two) times daily with a meal. ) 10 mL 11  . metoprolol succinate (TOPROL-XL) 25 MG 24 hr tablet Take 25 mg by mouth daily.    . Multiple Vitamin (MULTIVITAMIN WITH MINERALS) TABS tablet Take 1 tablet by mouth daily.      . pravastatin (PRAVACHOL) 40 MG tablet Take 40 mg by mouth daily.     . traMADol (ULTRAM) 50 MG tablet Take 1-2 tablets (50-100 mg total) by mouth every 6 (six) hours as needed (mild pain). 30 tablet 0   No current facility-administered medications for this visit.    Review of Systems  Musculoskeletal: Positive for myalgias.  All other systems reviewed and are negative.    PHYSICAL EXAMINATION: BP (!) 174/89 (BP Location: Left Arm, Patient Position: Sitting)   Pulse 96   Resp 20   Ht $R'5\' 9"'Kv$  (1.753 m)   Wt (!) 303 lb 3.2 oz (137.5 kg)   SpO2 97% Comment: RA with mask on  BMI 44.77 kg/m  Physical Exam Constitutional:      General: He is not in acute distress.    Appearance: Normal appearance. He is obese. He is not ill-appearing.  HENT:     Head: Normocephalic and atraumatic.  Eyes:     Extraocular Movements: Extraocular movements intact.  Cardiovascular:     Rate and Rhythm: Normal rate.  Pulmonary:     Effort: Pulmonary effort is normal. No respiratory distress.  Abdominal:     General: There is no distension.  Musculoskeletal:        General: Normal range of motion.     Cervical back: Normal range of motion.  Skin:    General: Skin is warm and dry.  Neurological:     General: No focal deficit present.     Mental Status: He is alert and oriented to person, place, and time.     Diagnostic Studies & Laboratory data:     Recent Radiology Findings:   CT CHEST WO CONTRAST  Result Date: 05/16/2020 CLINICAL DATA:  Previous right lower lobectomy for adenocarcinoma EXAM: CT CHEST WITHOUT CONTRAST TECHNIQUE: Multidetector CT imaging of the chest was performed following the standard protocol without IV contrast. COMPARISON:  1821, 07/19/2019 FINDINGS: Cardiovascular: Limited without IV contrast. Aorta atherosclerotic. Native coronary atherosclerosis noted. No significant aneurysm. No mediastinal hemorrhage or hematoma. Normal heart size. No pericardial effusion.  Mediastinum/Nodes: Within the limits of noncontrast imaging. No adenopathy appreciated. Stable thyroid enlargement. Trachea and esophagus unremarkable. No hiatal hernia. Lungs/Pleura: Very minor upper lobe centrilobular emphysema. Postop changes from right  lower lobectomy. Similar scarring pleural thickening in the right lower chest at the surgical area. Slight volume loss in the right hemithorax. No new or suspicious finding by noncontrast imaging. Similar scattered tiny pulmonary nodules all measuring 2-3 mm or less in size. No new or enlarging nodule. No acute airspace process, new collapse or consolidation. No pleural abnormality, effusion or pneumothorax. Upper Abdomen: Remote cholecystectomy. Abdominal atherosclerosis noted. No acute upper abdominal finding. Musculoskeletal: Degenerative changes noted of the thoracic spine. No acute osseous finding. No compression fracture. Intact sternum. IMPRESSION: Stable appearance status post right lower lobectomy. No evidence of recurrent or metastatic disease in the chest by noncontrast imaging. No acute intrathoracic finding. Aortic Atherosclerosis (ICD10-I70.0) and Emphysema (ICD10-J43.9). Electronically Signed   By: Jerilynn Mages.  Shick M.D.   On: 05/16/2020 09:49       I have independently reviewed the above radiology studies  and reviewed the findings with the patient.   Recent Lab Findings: Lab Results  Component Value Date   WBC 7.8 01/23/2020   HGB 12.2 (L) 01/23/2020   HCT 38.7 (L) 01/23/2020   PLT 222 01/23/2020   GLUCOSE 91 01/23/2020   ALT 23 01/23/2020   AST 23 01/23/2020   NA 138 01/23/2020   K 3.8 01/23/2020   CL 105 01/23/2020   CREATININE 1.41 (H) 01/23/2020   BUN 25 (H) 01/23/2020   CO2 23 01/23/2020   INR 1.0 03/01/2019   HGBA1C 9.8 (H) 02/22/2019       Assessment / Plan:   69 yo male s/p R VATS, RLLectomy for T1cN0M0 adenocarcinoma of the lung Doing well Will f/u in 15month with a LD CT chest for surveillance He will f/u as a  virtual appointment.     I  spent 10 minutes with  the patient face to face and greater then 50% of the time was spent in counseling and coordination of care.    HLajuana Matte12/08/2019 10:25 AM

## 2020-05-22 ENCOUNTER — Encounter: Payer: Self-pay | Admitting: Nutrition

## 2020-05-22 ENCOUNTER — Encounter: Payer: Medicare Other | Attending: "Endocrinology | Admitting: Nutrition

## 2020-05-22 ENCOUNTER — Other Ambulatory Visit: Payer: Self-pay

## 2020-05-22 VITALS — Ht 70.0 in | Wt 306.0 lb

## 2020-05-22 DIAGNOSIS — I10 Essential (primary) hypertension: Secondary | ICD-10-CM

## 2020-05-22 DIAGNOSIS — E119 Type 2 diabetes mellitus without complications: Secondary | ICD-10-CM | POA: Diagnosis not present

## 2020-05-22 DIAGNOSIS — E785 Hyperlipidemia, unspecified: Secondary | ICD-10-CM

## 2020-05-22 DIAGNOSIS — N183 Chronic kidney disease, stage 3 unspecified: Secondary | ICD-10-CM

## 2020-05-22 DIAGNOSIS — Z6841 Body Mass Index (BMI) 40.0 and over, adult: Secondary | ICD-10-CM | POA: Insufficient documentation

## 2020-05-22 NOTE — Patient Instructions (Addendum)
Goal  Increases physical activity gradually. Increase lower carb vegetables. Cut back on starches, Lose 2-3 lbs per month Keep A1C 6% or less.

## 2020-05-22 NOTE — Progress Notes (Signed)
Medical Nutrition Therapy:  Appt start time: 0930 end time:  0945  Assessment:  Primary concerns today: DIabetes Type 2, LIves with wife.  FBS"  92-150's.  Bedtime 99-160's. BS logs look fantastic. A1C  Will get done at the end of this month. He has worked on cutting out sweets, desserts and grazing. Trying to eat more vegetables.   Using Flexogenics in both knees and it has helped quite a bit with his mobility. Has a lot  Pain in his knees otherwise. Novolin 70/30 insulin  12 units twice a day.   Would benefit from referral to a nephrologist since is CKD is more chronic vs acute. Also would benefit from a GLP for needed weight loss.   Results for HADY, NIEMCZYK (MRN [id number]) as of 08/15/2019 08:14  Ref. Range 07/19/2019 09:58  Creatinine Latest Ref Range: 0.61 - 1.24 mg/dL 1.72 (H)  Calcium Latest Ref Range: 8.9 - 10.3 mg/dL 9.6  Anion gap Latest Ref Range: 5 - 15  10  Alkaline Phosphatase Latest Ref Range: 38 - 126 U/L 72  Albumin Latest Ref Range: 3.5 - 5.0 g/dL 4.6  AST Latest Ref Range: 15 - 41 U/L 24  ALT Latest Ref Range: 0 - 44 U/L 23  Total Protein Latest Ref Range: 6.5 - 8.1 g/dL 8.2 (H)  Total Bilirubin Latest Ref Range: 0.3 - 1.2 mg/dL 0.7  GFR, Est Non African American Latest Ref Range: >60 mL/min 41 (L)  GFR, Est African American Latest Ref Range: >60 mL/min 47 (L)    Lab Results  Component Value Date   HGBA1C 9.8 (H) 02/22/2019   CMP Latest Ref Rng & Units 01/23/2020 07/19/2019 03/09/2019  Glucose 70 - 99 mg/dL 91 89 127(H)  BUN 8 - 23 mg/dL 25(H) 26(H) 16  Creatinine 0.61 - 1.24 mg/dL 1.41(H) 1.72(H) 1.79(H)  Sodium 135 - 145 mmol/L 138 138 138  Potassium 3.5 - 5.1 mmol/L 3.8 3.9 4.1  Chloride 98 - 111 mmol/L 105 102 102  CO2 22 - 32 mmol/L 23 26 25   Calcium 8.9 - 10.3 mg/dL 9.2 9.6 9.0  Total Protein 6.5 - 8.1 g/dL 7.7 8.2(H) 7.1  Total Bilirubin 0.3 - 1.2 mg/dL 1.0 0.7 0.7  Alkaline Phos 38 - 126 U/L 66 72 85  AST 15 - 41 U/L 23 24 21   ALT 0 - 44 U/L 23 23  22     Preferred Learning Style:  No preference indicated   Learning Readiness:   Ready  Change in progress   MEDICATIONS:    DIETARY INTAKE:  24-hr recall:  B ( AM): Fiber one cereal, 1 cup milk, gala apple and boiled egg,water Snk ( AM):  L ( PM): Salad grilled chicken, water, 4 crackrs Snk ( PM): D ( PM): Northern beans, [place] and green beans,  Snk ( PM):  Beverages: water  Usual physical activity: ADL  Estimated energy needs: 1800 calories 200 g carbohydrates 135 g protein 50 g fat  Progress Towards Goal(s):  In progress.   Nutritional Diagnosis:  NB-1.1 Food and nutrition-related knowledge deficit As related to Diabetes and OBesity.  As evidenced by A1C 8.9% and BMI 44.    Intervention:  Nutrition and Diabetes education provided on My Plate, CHO counting, meal planning, portion sizes, timing of meals, avoiding snacks between meals unless having a low blood sugar, target ranges for A1C and blood sugars, signs/symptoms and treatment of hyper/hypoglycemia, monitoring blood sugars, taking medications as prescribed, benefits of exercising 30 minutes per day and  prevention of complications of DM.   Goal  Increases physical activity gradually. Increase lower carb vegetables. Cut back on starches, Lose 2-3 lbs per month Keep A1C 6% or less.  Teaching Method Utilized:  Visual Auditory Hands on  Handouts given during visit include:  The Plate Method  Meal Plan Card  Diabetes Instructions   Barriers to learning/adherence to lifestyle change: none  Demonstrated degree of understanding via:  Teach Back   Monitoring/Evaluation:  Dietary intake, exercise,  and body weight in 3 month(s). Refer to Nephrology if not already seeing someone.

## 2020-05-29 DIAGNOSIS — M1711 Unilateral primary osteoarthritis, right knee: Secondary | ICD-10-CM | POA: Diagnosis not present

## 2020-05-29 DIAGNOSIS — M25562 Pain in left knee: Secondary | ICD-10-CM | POA: Diagnosis not present

## 2020-05-29 DIAGNOSIS — M25561 Pain in right knee: Secondary | ICD-10-CM | POA: Diagnosis not present

## 2020-06-05 DIAGNOSIS — M1711 Unilateral primary osteoarthritis, right knee: Secondary | ICD-10-CM | POA: Diagnosis not present

## 2020-06-05 DIAGNOSIS — M25561 Pain in right knee: Secondary | ICD-10-CM | POA: Diagnosis not present

## 2020-08-07 ENCOUNTER — Inpatient Hospital Stay (HOSPITAL_COMMUNITY): Payer: Medicare Other | Attending: Hematology

## 2020-08-07 ENCOUNTER — Other Ambulatory Visit: Payer: Self-pay

## 2020-08-07 ENCOUNTER — Ambulatory Visit (HOSPITAL_COMMUNITY)
Admission: RE | Admit: 2020-08-07 | Discharge: 2020-08-07 | Disposition: A | Discharge status: Home / Self Care | Payer: Medicare Other | Source: Ambulatory Visit | Attending: Hematology | Admitting: Hematology

## 2020-08-07 DIAGNOSIS — C3431 Malignant neoplasm of lower lobe, right bronchus or lung: Secondary | ICD-10-CM | POA: Insufficient documentation

## 2020-08-07 DIAGNOSIS — C3491 Malignant neoplasm of unspecified part of right bronchus or lung: Secondary | ICD-10-CM

## 2020-08-07 LAB — CBC WITH DIFFERENTIAL/PLATELET
Abs Immature Granulocytes: 0.03 10*3/uL (ref 0.00–0.07)
Basophils Absolute: 0 10*3/uL (ref 0.0–0.1)
Basophils Relative: 1 %
Eosinophils Absolute: 0.1 10*3/uL (ref 0.0–0.5)
Eosinophils Relative: 2 %
HCT: 38.8 % — ABNORMAL LOW (ref 39.0–52.0)
Hemoglobin: 12.1 g/dL — ABNORMAL LOW (ref 13.0–17.0)
Immature Granulocytes: 0 %
Lymphocytes Relative: 33 %
Lymphs Abs: 2.5 10*3/uL (ref 0.7–4.0)
MCH: 28.5 pg (ref 26.0–34.0)
MCHC: 31.2 g/dL (ref 30.0–36.0)
MCV: 91.5 fL (ref 80.0–100.0)
Monocytes Absolute: 0.7 10*3/uL (ref 0.1–1.0)
Monocytes Relative: 9 %
Neutro Abs: 4.2 10*3/uL (ref 1.7–7.7)
Neutrophils Relative %: 55 %
Platelets: 222 10*3/uL (ref 150–400)
RBC: 4.24 MIL/uL (ref 4.22–5.81)
RDW: 12.8 % (ref 11.5–15.5)
WBC: 7.6 10*3/uL (ref 4.0–10.5)
nRBC: 0 % (ref 0.0–0.2)

## 2020-08-07 LAB — COMPREHENSIVE METABOLIC PANEL
ALT: 26 U/L (ref 0–44)
AST: 23 U/L (ref 15–41)
Albumin: 4 g/dL (ref 3.5–5.0)
Alkaline Phosphatase: 63 U/L (ref 38–126)
Anion gap: 10 (ref 5–15)
BUN: 30 mg/dL — ABNORMAL HIGH (ref 8–23)
CO2: 24 mmol/L (ref 22–32)
Calcium: 9.2 mg/dL (ref 8.9–10.3)
Chloride: 102 mmol/L (ref 98–111)
Creatinine, Ser: 1.58 mg/dL — ABNORMAL HIGH (ref 0.61–1.24)
GFR, Estimated: 48 mL/min — ABNORMAL LOW (ref >60)
Glucose, Bld: 85 mg/dL (ref 70–99)
Potassium: 4.2 mmol/L (ref 3.5–5.1)
Sodium: 136 mmol/L (ref 135–145)
Total Bilirubin: 0.7 mg/dL (ref 0.3–1.2)
Total Protein: 7.7 g/dL (ref 6.5–8.1)

## 2020-08-07 MED ORDER — IOHEXOL 300 MG/ML  SOLN
75.0000 mL | Freq: Once | INTRAMUSCULAR | Status: AC | PRN
  Administered 2020-08-07: 75 mL via INTRAVENOUS

## 2020-08-14 ENCOUNTER — Other Ambulatory Visit: Payer: Self-pay

## 2020-08-14 ENCOUNTER — Inpatient Hospital Stay (HOSPITAL_COMMUNITY): Payer: Medicare Other | Attending: Hematology | Admitting: Hematology

## 2020-08-14 VITALS — BP 148/65 | HR 82 | Temp 96.8°F | Resp 18 | Wt 298.2 lb

## 2020-08-14 DIAGNOSIS — E7800 Pure hypercholesterolemia, unspecified: Secondary | ICD-10-CM | POA: Insufficient documentation

## 2020-08-14 DIAGNOSIS — Z79899 Other long term (current) drug therapy: Secondary | ICD-10-CM | POA: Insufficient documentation

## 2020-08-14 DIAGNOSIS — I700 Atherosclerosis of aorta: Secondary | ICD-10-CM | POA: Insufficient documentation

## 2020-08-14 DIAGNOSIS — Z87891 Personal history of nicotine dependence: Secondary | ICD-10-CM | POA: Insufficient documentation

## 2020-08-14 DIAGNOSIS — E1140 Type 2 diabetes mellitus with diabetic neuropathy, unspecified: Secondary | ICD-10-CM | POA: Diagnosis not present

## 2020-08-14 DIAGNOSIS — C3491 Malignant neoplasm of unspecified part of right bronchus or lung: Secondary | ICD-10-CM | POA: Diagnosis not present

## 2020-08-14 DIAGNOSIS — Z8701 Personal history of pneumonia (recurrent): Secondary | ICD-10-CM | POA: Diagnosis not present

## 2020-08-14 DIAGNOSIS — C3431 Malignant neoplasm of lower lobe, right bronchus or lung: Secondary | ICD-10-CM | POA: Diagnosis present

## 2020-08-14 DIAGNOSIS — I10 Essential (primary) hypertension: Secondary | ICD-10-CM | POA: Diagnosis not present

## 2020-08-14 NOTE — Progress Notes (Signed)
[name], [name] [address]   CLINIC:  Medical Oncology/Hematology  PCP:  Lemmie Evens, MD [place]. / [place] [address] [phone]   REASON FOR VISIT:  Follow-up for right lung adenocarcinoma  PRIOR THERAPY: Right lobectomy and lymph node biopsy on 03/07/2019  NGS Results: Not done  CURRENT THERAPY: Surveillance  BRIEF ONCOLOGIC HISTORY:  Oncology History  Adenocarcinoma of right lung, stage 1 ([place])  03/22/2019 Initial Diagnosis   Adenocarcinoma of right lung, stage 1 ([place])   03/22/2019 Cancer Staging   Staging form: Lung, AJCC 8th Edition - Clinical: cT1, cN0, cM0 - Signed by Derek Jack, MD on 03/22/2019     CANCER STAGING: Cancer Staging Adenocarcinoma of right lung, stage 1 ([place]) Staging form: Lung, AJCC 8th Edition - Clinical: cT1, cN0, cM0 - Signed by Derek Jack, MD on 03/22/2019   INTERVAL HISTORY:  Gregory Lindsey, a 70 y.o. male, returns for routine follow-up of his right lung adenocarcinoma. Joncarlo was last seen on 02/05/2020.   Today he reports feeling well. He denies having recent infections, F/C, cough, hemoptysis or leg swelling, though he reports having some SOB when he wakes up in the morning.  He is able to do his ADL's and chores.   REVIEW OF SYSTEMS:  Review of Systems  Constitutional: Positive for fatigue (75%). Negative for appetite change, chills and fever.  Respiratory: Positive for shortness of breath (in AM). Negative for cough and hemoptysis.   Cardiovascular: Negative for leg swelling.  All other systems reviewed and are negative.   PAST MEDICAL/SURGICAL HISTORY:  Past Medical History:  Diagnosis Date  . AKI (acute kidney injury) ([place]) 02/2019  . Diabetes mellitus   . Hypercholesterolemia   . Hypertension   . Pneumonia    Past Surgical History:  Procedure Laterality Date  . CHOLECYSTECTOMY N/A 01/22/2016   Procedure: LAPAROSCOPIC CHOLECYSTECTOMY;   Surgeon: Aviva Signs, MD;  Location: [facility];  Service: General;  Laterality: N/A;  . COLONOSCOPY  2007 [facility]   1 CM SIMPLE ADENOMA  . COLONOSCOPY N/A 04/26/2015   Procedure: COLONOSCOPY;  Surgeon: Danie Binder, MD;  Location: [facility];  Service: Endoscopy;  Laterality: N/A;  930   . VIDEO ASSISTED THORACOSCOPY (VATS)/ LOBECTOMY Right 03/07/2019   Procedure: VIDEO ASSISTED THORACOSCOPY (VATS)/RIGHT LOWER LOBECTOMY;  Surgeon: Lajuana Matte, MD;  Location: [facility];  Service: Thoracic;  Laterality: Right;  Marland Kitchen VIDEO BRONCHOSCOPY N/A 03/07/2019   Procedure: VIDEO BRONCHOSCOPY;  Surgeon: Lajuana Matte, MD;  Location: [facility];  Service: Thoracic;  Laterality: N/A;  . VIDEO BRONCHOSCOPY WITH ENDOBRONCHIAL NAVIGATION N/A 02/22/2019   Procedure: VIDEO BRONCHOSCOPY WITH ENDOBRONCHIAL NAVIGATION WITH BIOPIES;  Surgeon: Lajuana Matte, MD;  Location: [facility];  Service: Thoracic;  Laterality: N/A;    SOCIAL HISTORY:  Social History   Socioeconomic History  . Marital status: Married    Spouse name: Not on file  . Number of children: Not on file  . Years of education: Not on file  . Highest education level: Not on file  Occupational History  . Occupation: retired  Tobacco Use  . Smoking status: Former Research scientist (life sciences)  . Smokeless tobacco: Never Used  Vaping Use  . Vaping Use: Never used  Substance and Sexual Activity  . Alcohol use: Yes    Alcohol/week: 0.0 standard drinks    Comment: beer 2-3 times per week, 2 shots of scotch on saturday night , rarely since June  .  Drug use: No  . Sexual activity: Not Currently  Other Topics Concern  . Not on file  Social History Narrative  . Not on file   Social Determinants of Health   Financial Resource Strain: Not on file  Food Insecurity: Not on file  Transportation Needs: Not on file  Physical Activity: Not on file  Stress: Not on file  Social Connections: Not on file  Intimate Partner Violence: Not on file    FAMILY HISTORY:  Family  History  Problem Relation Age of Onset  . Cancer Paternal Grandfather   . Colon cancer Neg Hx     CURRENT MEDICATIONS:  Current Outpatient Medications  Medication Sig Dispense Refill  . amLODipine (NORVASC) 10 MG tablet Take 1 tablet (10 mg total) by mouth daily. 30 tablet 3  . aspirin 81 MG tablet Take 81 mg by mouth every evening.     . blood glucose meter kit and supplies KIT Dispense based on patient and insurance preference. Use up to four times daily as directed. (FOR ICD-9 250.00, 250.01). 1 each 0  . cyanocobalamin 2000 MCG tablet Take 2,000 mcg by mouth 2 (two) times daily.     . ferrous sulfate 325 (65 FE) MG tablet Take 325 mg by mouth 2 (two) times daily.    . insulin aspart protamine- aspart (NOVOLOG MIX 70/30) (70-30) 100 UNIT/ML injection Inject 0.08 mLs (8 Units total) into the skin 2 (two) times daily with a meal. (Patient taking differently: Inject 16 Units into the skin 2 (two) times daily with a meal.) 10 mL 11  . metoprolol succinate (TOPROL-XL) 25 MG 24 hr tablet Take 25 mg by mouth daily.    . Multiple Vitamin (MULTIVITAMIN WITH MINERALS) TABS tablet Take 1 tablet by mouth daily.    . pravastatin (PRAVACHOL) 40 MG tablet Take 40 mg by mouth daily.     . traMADol (ULTRAM) 50 MG tablet Take 1-2 tablets (50-100 mg total) by mouth every 6 (six) hours as needed (mild pain). (Patient not taking: Reported on 08/14/2020) 30 tablet 0   No current facility-administered medications for this visit.    ALLERGIES:  No Known Allergies  PHYSICAL EXAM:  Performance status (ECOG): 1 - Symptomatic but completely ambulatory  Vitals:   08/14/20 0757  BP: (!) 148/65  Pulse: 82  Resp: 18  Temp: (!) 96.8 F (36 C)  SpO2: 100%   Wt Readings from Last 3 Encounters:  08/14/20 298 lb 3.2 oz (135.3 kg)  05/22/20 (!) 306 lb (138.8 kg)  05/17/20 (!) 303 lb 3.2 oz (137.5 kg)   Physical Exam Vitals reviewed.  Constitutional:      Appearance: Normal appearance. He is obese.   Cardiovascular:     Rate and Rhythm: Normal rate and regular rhythm.     Pulses: Normal pulses.     Heart sounds: Normal heart sounds.  Pulmonary:     Effort: Pulmonary effort is normal.     Breath sounds: Normal breath sounds.  Musculoskeletal:     Right lower leg: No edema.     Left lower leg: No edema.  Neurological:     General: No focal deficit present.     Mental Status: He is alert and oriented to person, place, and time.  Psychiatric:        Mood and Affect: Mood normal.        Behavior: Behavior normal.      LABORATORY DATA:  I have reviewed the labs as listed.  CBC  Latest Ref Rng & Units 08/07/2020 01/23/2020 07/19/2019  WBC 4.0 - 10.5 K/uL 7.6 7.8 9.8  Hemoglobin 13.0 - 17.0 g/dL 12.1(L) 12.2(L) 12.0(L)  Hematocrit 39.0 - 52.0 % 38.8(L) 38.7(L) 39.3  Platelets 150 - 400 K/uL 222 222 295   CMP Latest Ref Rng & Units 08/07/2020 01/23/2020 07/19/2019  Glucose 70 - 99 mg/dL 85 91 89  BUN 8 - 23 mg/dL 30(H) 25(H) 26(H)  Creatinine 0.61 - 1.24 mg/dL 1.58(H) 1.41(H) 1.72(H)  Sodium 135 - 145 mmol/L 136 138 138  Potassium 3.5 - 5.1 mmol/L 4.2 3.8 3.9  Chloride 98 - 111 mmol/L 102 105 102  CO2 22 - 32 mmol/L 24 23 26   Calcium 8.9 - 10.3 mg/dL 9.2 9.2 9.6  Total Protein 6.5 - 8.1 g/dL 7.7 7.7 8.2(H)  Total Bilirubin 0.3 - 1.2 mg/dL 0.7 1.0 0.7  Alkaline Phos 38 - 126 U/L 63 66 72  AST 15 - 41 U/L 23 23 24   ALT 0 - 44 U/L 26 23 23     DIAGNOSTIC IMAGING:  I have independently reviewed the scans and discussed with the patient. CT Chest W Contrast  Result Date: 08/08/2020 CLINICAL DATA:  Follow-up non-small cell lung cancer in a 70 year old male. EXAM: CT CHEST WITH CONTRAST TECHNIQUE: Multidetector CT imaging of the chest was performed during intravenous contrast administration. CONTRAST:  20m OMNIPAQUE IOHEXOL 300 MG/ML  SOLN COMPARISON:  May 16, 2020 FINDINGS: Cardiovascular: Calcified and noncalcified atheromatous plaque in the thoracic aorta. Heart size normal. No  pericardial effusion. No signs of aortic dilation. Central pulmonary vasculature is unremarkable aside from some distortion following RIGHT lower lobectomy. Mediastinum/Nodes: No thoracic inlet adenopathy. No mediastinal lymphadenopathy. No axillary lymphadenopathy. No hilar lymphadenopathy. Esophagus grossly normal. Lungs/Pleura: 3 mm RIGHT upper lobe pulmonary nodule (image 59, series 4) unchanged dating back to February of 2021. Postoperative changes in the RIGHT chest as before related to RIGHT lower lobectomy. Tiny LEFT upper lobe pulmonary nodule on reconstructed series 100, image 24 is unchanged as well in the LEFT lung apex approximately 3 mm. No consolidation, effusion or suspicious pulmonary nodule. Upper Abdomen: Low-density RIGHT hepatic lobe lesion on image 122 of series 2 measures 19 mm, perhaps faintly seen on the study of February of 2021 some enhancement in this location on the study of June of 2020 suggests underlying hemangioma. No uptake in this location on remote PET exam. Better seen on today's exam than on other evaluations. Post cholecystectomy. Imaged portions of spleen, pancreas and adrenal glands are normal. No acute upper abdominal findings. Musculoskeletal: No acute musculoskeletal process. Spinal degenerative changes. IMPRESSION: 1. Postoperative changes in the RIGHT chest related to RIGHT lower lobectomy. No evidence of recurrent or metastatic disease. 2. Low-density lesion in the RIGHT hepatic lobe, perhaps faintly seen on the study of February of 2021, some enhancement in this location on the study of June of 2020 suggests underlying hemangioma. Given that this may be larger than on prior studies and is now clearly demonstrated on the current scan with suggest MRI for definitive characterization. Increased size and or conspicuity could also be due to differences in phases and the appearance of surrounding liver perhaps more steatotic on prior imaging. 3. Aortic atherosclerosis.  Aortic Atherosclerosis (ICD10-I70.0). Electronically Signed   By: GZetta BillsM.D.   On: 08/08/2020 12:15     ASSESSMENT:  1. Right lung adenocarcinoma (PT1CPN0): -Incidental right lung mass on a CT CAP done for abdominal pain. -Quit smoking 19 years ago, smoked 1/4 pack of  cigarettes a day for 18 years followed by cigars and pipes for 10 years. -PET scan on 01/16/2019 shows 1.5 x 2.4 cm right lower lobe mass with SUV 3.2 with no adenopathy or metastatic disease. -MRI of the brain on 01/03/2019 was negative for metastatic disease. -He underwent right lobectomy and lymph node biopsy on 03/07/2019 which showed 2.3 cm adenocarcinoma, acinar predominant. 0/8 lymph nodes from level 7, 11, 9, 12 were negative. Margins were negative. No visceral pleural invasion. No lymphovascular invasion. -CT chest on 01/23/2020 shows right lower lobectomy.  No evidence of recurrent or metastatic disease in the chest.   PLAN:  1. Right lung adenocarcinoma (PT1CPN0): -He does not have any chest pains or hemoptysis.  No new cough reported. -We reviewed CT chest from 08/07/2020.  No evidence of recurrence in the chest.  There is a hypodense lesion in the right lobe of the liver, with differential including hemangioma. -Will order MRI of the abdomen with and without contrast to further delineate this lesion. -We will talk to him on the phone after the MRI. -Otherwise he will come back in 6 months for follow-up with repeat CT scan of the chest.  2.  Diabetic neuropathy: -Numbness in the feet has been stable.   Orders placed this encounter:  No orders of the defined types were placed in this encounter.    Derek Jack, MD [place] [phone]   I, Milinda Antis, am acting as a scribe for Dr. Sanda Linger.  I, Derek Jack MD, have reviewed the above documentation for accuracy and completeness, and I agree with the above.

## 2020-08-14 NOTE — Patient Instructions (Signed)
[place] at [facility] Discharge Instructions  You were seen today by Dr. Delton Coombes. He went over your recent results and scans. In order to better visualize the spot on your liver, you will be scheduled to have an MRI of your abdomen; Dr. Delton Coombes will contact you by phone with the results. You will also be scheduled to have a CT scan of your chest before your next visit. Dr. Delton Coombes will see you back in 6 months for labs and follow up.   Thank you for choosing [place] at [facility] to provide your oncology and hematology care.  To afford each patient quality time with our provider, please arrive at least 15 minutes before your scheduled appointment time.   If you have a lab appointment with the [facility] please come in thru the Main Entrance and check in at the main information desk  You need to re-schedule your appointment should you arrive 10 or more minutes late.  We strive to give you quality time with our providers, and arriving late affects you and other patients whose appointments are after yours.  Also, if you no show three or more times for appointments you may be dismissed from the clinic at the providers discretion.     Again, thank you for choosing [facility].  Our hope is that these requests will decrease the amount of time that you wait before being seen by our physicians.       _____________________________________________________________  Should you have questions after your visit to [facility], please contact our office at [phone] between the hours of 8:00 a.m. and 4:30 p.m.  Voicemails left after 4:00 p.m. will not be returned until the following business day.  For prescription refill requests, have your pharmacy contact our office and allow 72 hours.    Cancer Center Support Programs:   > Cancer Support Group  2nd Tuesday of the month 1pm-2pm, Journey Room

## 2020-08-15 ENCOUNTER — Ambulatory Visit (HOSPITAL_COMMUNITY)
Admission: RE | Admit: 2020-08-15 | Discharge: 2020-08-15 | Disposition: A | Discharge status: Home / Self Care | Payer: Medicare Other | Source: Ambulatory Visit | Attending: Hematology | Admitting: Hematology

## 2020-08-15 DIAGNOSIS — C3491 Malignant neoplasm of unspecified part of right bronchus or lung: Secondary | ICD-10-CM | POA: Insufficient documentation

## 2020-08-15 MED ORDER — GADOBUTROL 1 MMOL/ML IV SOLN
10.0000 mL | Freq: Once | INTRAVENOUS | Status: AC | PRN
  Administered 2020-08-15: 10 mL via INTRAVENOUS

## 2020-08-19 ENCOUNTER — Telehealth (HOSPITAL_COMMUNITY): Payer: Medicare Other | Admitting: Hematology

## 2020-08-21 ENCOUNTER — Inpatient Hospital Stay (HOSPITAL_BASED_OUTPATIENT_CLINIC_OR_DEPARTMENT_OTHER): Payer: Medicare Other | Admitting: Hematology

## 2020-08-21 ENCOUNTER — Other Ambulatory Visit: Payer: Self-pay

## 2020-08-21 DIAGNOSIS — K7689 Other specified diseases of liver: Secondary | ICD-10-CM

## 2020-08-21 NOTE — Progress Notes (Signed)
Virtual Visit via Telephone Note  I connected with Gregory Lindsey on 08/21/20 at  4:00 PM EST by telephone and verified that I am speaking with the correct person using two identifiers.  Location: Patient: At home Provider: In the office   I discussed the limitations, risks, security and privacy concerns of performing an evaluation and management service by telephone and the availability of in person appointments. I also discussed with the patient that there may be a patient responsible charge related to this service. The patient expressed understanding and agreed to proceed.   History of Present Illness: Gregory Lindsey is seen in our clinic for follow-up of right lung cancer for which he underwent right lobectomy and lymph node biopsy on 03/07/2019 which showed 2.3 cm adenocarcinoma, 0/8 lymph nodes positive, margins negative.   Observations/Objective: He denies any abdominal pains.  He has constipation from taking iron pills.  Appetite is 7%.  Energy levels are 75%.  Assessment and Plan:  1.  Hypodense lesion in the right lobe of the liver: -We have reviewed MRI of the abdomen with and without contrast from 08/15/2020 which showed lesion in the right lobe of the liver is consistent with benign hepatic angioma. -Tiny cystic lesions in the tail of the pancreas without ductal dilatation consistent with intraductal papillary mucinous neoplasms.  2.  Right lung adenocarcinoma: -He will come back in 6 months with repeat CT scan of the chest.   Follow Up Instructions: RTC 6 months with lab and scan.   I discussed the assessment and treatment plan with the patient. The patient was provided an opportunity to ask questions and all were answered. The patient agreed with the plan and demonstrated an understanding of the instructions.   The patient was advised to call back or seek an in-person evaluation if the symptoms worsen or if the condition fails to improve as anticipated.  I provided 11  minutes of non-face-to-face time during this encounter.   Derek Jack, MD

## 2020-09-23 ENCOUNTER — Encounter: Payer: Medicare Other | Attending: Family Medicine | Admitting: Nutrition

## 2020-09-23 ENCOUNTER — Encounter: Payer: Self-pay | Admitting: Nutrition

## 2020-09-23 ENCOUNTER — Other Ambulatory Visit: Payer: Self-pay

## 2020-09-23 VITALS — Ht 70.0 in | Wt 307.0 lb

## 2020-09-23 DIAGNOSIS — Z6841 Body Mass Index (BMI) 40.0 and over, adult: Secondary | ICD-10-CM | POA: Diagnosis present

## 2020-09-23 DIAGNOSIS — E785 Hyperlipidemia, unspecified: Secondary | ICD-10-CM | POA: Diagnosis present

## 2020-09-23 DIAGNOSIS — N183 Chronic kidney disease, stage 3 unspecified: Secondary | ICD-10-CM

## 2020-09-23 DIAGNOSIS — E1122 Type 2 diabetes mellitus with diabetic chronic kidney disease: Secondary | ICD-10-CM | POA: Diagnosis not present

## 2020-09-23 DIAGNOSIS — I120 Hypertensive chronic kidney disease with stage 5 chronic kidney disease or end stage renal disease: Secondary | ICD-10-CM | POA: Diagnosis present

## 2020-09-23 DIAGNOSIS — I10 Essential (primary) hypertension: Secondary | ICD-10-CM

## 2020-09-23 NOTE — Progress Notes (Signed)
1000  Assessment:  Primary concerns today: DIabetes Type 2, LIves with wife.  FBS"  90-200's..  Bedtime  160-200's  He notes he has slipped up and been eating late  at night, skipping lunch and eating too much at night.  Going to get blood work in the next few weeks.  He notes he has to start cooking his own meals again since his wife cooks more [place] style foods.  Has joined the [employer]. Goes up there 4 times per week. Uses some of the equipment.  Using Flexogenics in both knees and it has helped quite a bit with his mobility. Has a lot  Pain in his knees otherwise. Novolin 70/30 insulin  12 units twice a day.   Would benefit from referral to a nephrologist since is CKD is more chronic vs acute. Also would benefit from a GLP for needed weight loss.   Results for LOYE, VENTO (MRN [id number]) as of 08/15/2019 08:14  Ref. Range 07/19/2019 09:58  Creatinine Latest Ref Range: 0.61 - 1.24 mg/dL 1.72 (H)  Calcium Latest Ref Range: 8.9 - 10.3 mg/dL 9.6  Anion gap Latest Ref Range: 5 - 15  10  Alkaline Phosphatase Latest Ref Range: 38 - 126 U/L 72  Albumin Latest Ref Range: 3.5 - 5.0 g/dL 4.6  AST Latest Ref Range: 15 - 41 U/L 24  ALT Latest Ref Range: 0 - 44 U/L 23  Total Protein Latest Ref Range: 6.5 - 8.1 g/dL 8.2 (H)  Total Bilirubin Latest Ref Range: 0.3 - 1.2 mg/dL 0.7  GFR, Est Non African American Latest Ref Range: >60 mL/min 41 (L)  GFR, Est African American Latest Ref Range: >60 mL/min 47 (L)    Lab Results  Component Value Date   HGBA1C 9.8 (H) 02/22/2019   CMP Latest Ref Rng & Units 08/07/2020 01/23/2020 07/19/2019  Glucose 70 - 99 mg/dL 85 91 89  BUN 8 - 23 mg/dL 30(H) 25(H) 26(H)  Creatinine 0.61 - 1.24 mg/dL 1.58(H) 1.41(H) 1.72(H)  Sodium 135 - 145 mmol/L 136 138 138  Potassium 3.5 - 5.1 mmol/L 4.2 3.8 3.9  Chloride 98 - 111 mmol/L 102 105 102  CO2 22 - 32 mmol/L 24 23 26   Calcium 8.9 - 10.3 mg/dL 9.2 9.2 9.6  Total Protein 6.5 - 8.1 g/dL 7.7 7.7 8.2(H)  Total  Bilirubin 0.3 - 1.2 mg/dL 0.7 1.0 0.7  Alkaline Phos 38 - 126 U/L 63 66 72  AST 15 - 41 U/L 23 23 24   ALT 0 - 44 U/L 26 23 23     Preferred Learning Style:  No preference indicated   Learning Readiness:   Ready  Change in progress   MEDICATIONS:    DIETARY INTAKE:  24-hr recall:  B ( AM): Fiber one cereal, 1 cup milk, gala apple and boiled egg,water Snk ( AM):  L ( PM): Salad grilled chicken, water, 4 crackrs Snk ( PM): D ( PM): Northern beans, [place] and green beans,  Snk ( PM):  Beverages: water  Usual physical activity: ADL  Estimated energy needs: 1800 calories 200 g carbohydrates 135 g protein 50 g fat  Progress Towards Goal(s):  In progress.   Nutritional Diagnosis:  NB-1.1 Food and nutrition-related knowledge deficit As related to Diabetes and OBesity.  As evidenced by A1C 8.9% and BMI 44.    Intervention:  Nutrition and Diabetes education provided on My Plate, CHO counting, meal planning, portion sizes, timing of meals, avoiding snacks between meals unless having a  low blood sugar, target ranges for A1C and blood sugars, signs/symptoms and treatment of hyper/hypoglycemia, monitoring blood sugars, taking medications as prescribed, benefits of exercising 30 minutes per day and prevention of complications of DM.   Goals  Don't skip lunch Start cooking meals at home. Exercise 30-45 minutes 4 days per week. Cut out eating after supper. Work on portion sizes. Lose 1 lb per week.   Teaching Method Utilized:  Visual Auditory Hands on  Handouts given during visit include:  The Plate Method  Meal Plan Card  Diabetes Instructions   Barriers to learning/adherence to lifestyle change: none  Demonstrated degree of understanding via:  Teach Back   Monitoring/Evaluation:  Dietary intake, exercise,  and body weight in 3 month(s). Refer to Nephrology if not already seeing someone. Would benefit from a GLP 1 for needed weight loss.

## 2020-09-23 NOTE — Patient Instructions (Signed)
Goals  Don't skip lunch Start cooking meals at home. Exercise 30-45 minutes 4 days per week. Cut out eating after supper. Work on portion sizes. Lose 1 lb per week.

## 2020-11-15 ENCOUNTER — Other Ambulatory Visit: Payer: Self-pay

## 2020-11-15 ENCOUNTER — Telehealth: Payer: Medicare Other | Admitting: Thoracic Surgery (Cardiothoracic Vascular Surgery)

## 2021-01-22 ENCOUNTER — Other Ambulatory Visit: Payer: Self-pay

## 2021-01-22 ENCOUNTER — Encounter: Payer: Medicare Other | Attending: Family Medicine | Admitting: Nutrition

## 2021-01-22 ENCOUNTER — Encounter: Payer: Self-pay | Admitting: Nutrition

## 2021-01-22 VITALS — Ht 70.0 in | Wt 307.8 lb

## 2021-01-22 DIAGNOSIS — Z6841 Body Mass Index (BMI) 40.0 and over, adult: Secondary | ICD-10-CM | POA: Insufficient documentation

## 2021-01-22 DIAGNOSIS — E118 Type 2 diabetes mellitus with unspecified complications: Secondary | ICD-10-CM | POA: Diagnosis present

## 2021-01-22 DIAGNOSIS — IMO0002 Reserved for concepts with insufficient information to code with codable children: Secondary | ICD-10-CM

## 2021-01-22 DIAGNOSIS — I10 Essential (primary) hypertension: Secondary | ICD-10-CM | POA: Diagnosis present

## 2021-01-22 DIAGNOSIS — E785 Hyperlipidemia, unspecified: Secondary | ICD-10-CM | POA: Insufficient documentation

## 2021-01-22 DIAGNOSIS — E1165 Type 2 diabetes mellitus with hyperglycemia: Secondary | ICD-10-CM | POA: Insufficient documentation

## 2021-01-22 DIAGNOSIS — N183 Chronic kidney disease, stage 3 unspecified: Secondary | ICD-10-CM | POA: Insufficient documentation

## 2021-01-22 NOTE — Progress Notes (Signed)
Begin time: 0930 End Time 1000  Assessment:  Primary concerns today: DIabetes Type 2, LIves with wife.   FBS 70/30 units 10 units twice a day. Sometimes takes 12 units depending on his BS. FBS 130-170's in am an before bed; 100-170's before dinner. BS are doing better.  PCP DR. Knowlton Most recent A1C   10/09/20 5.9%. 10-12 units twice per day due to lower blood sugars.  Says his biggest issues is snacking at night.  He notes he has to start cooking his own meals again since his wife cooks more [place] style foods.  Has joined the [facility] [employer]. Goes up there 4 times per week. Uses some of the equipment.  Using Flexogenics in both knees and it has helped quite a bit with his mobility. Has a lot  Pain in his knees otherwise.  Would benefit from referral to a nephrologist since is CKD is more chronic vs acute. Also would benefit from a GLP for needed weight loss.   Results for Gregory Lindsey, Gregory Lindsey (MRN [id number]) as of 08/15/2019 08:14  Ref. Range 07/19/2019 09:58  Creatinine Latest Ref Range: 0.61 - 1.24 mg/dL 1.72 (H)  Calcium Latest Ref Range: 8.9 - 10.3 mg/dL 9.6  Anion gap Latest Ref Range: 5 - 15  10  Alkaline Phosphatase Latest Ref Range: 38 - 126 U/L 72  Albumin Latest Ref Range: 3.5 - 5.0 g/dL 4.6  AST Latest Ref Range: 15 - 41 U/L 24  ALT Latest Ref Range: 0 - 44 U/L 23  Total Protein Latest Ref Range: 6.5 - 8.1 g/dL 8.2 (H)  Total Bilirubin Latest Ref Range: 0.3 - 1.2 mg/dL 0.7  GFR, Est Non African American Latest Ref Range: >60 mL/min 41 (L)  GFR, Est African American Latest Ref Range: >60 mL/min 47 (L)    Lab Results  Component Value Date   HGBA1C 9.8 (H) 02/22/2019   CMP Latest Ref Rng & Units 08/07/2020 01/23/2020 07/19/2019  Glucose 70 - 99 mg/dL 85 91 89  BUN 8 - 23 mg/dL 30(H) 25(H) 26(H)  Creatinine 0.61 - 1.24 mg/dL 1.58(H) 1.41(H) 1.72(H)  Sodium 135 - 145 mmol/L 136 138 138  Potassium 3.5 - 5.1 mmol/L 4.2 3.8 3.9  Chloride 98 - 111 mmol/L 102 105 102  CO2 22 -  32 mmol/L 24 23 26   Calcium 8.9 - 10.3 mg/dL 9.2 9.2 9.6  Total Protein 6.5 - 8.1 g/dL 7.7 7.7 8.2(H)  Total Bilirubin 0.3 - 1.2 mg/dL 0.7 1.0 0.7  Alkaline Phos 38 - 126 U/L 63 66 72  AST 15 - 41 U/L 23 23 24   ALT 0 - 44 U/L 26 23 23     Preferred Learning Style: No preference indicated   Learning Readiness:  Ready Change in progress   MEDICATIONS:    DIETARY INTAKE:  24-hr recall:  B ( AM): Fiber one cereal, 1 cup milk, gala apple and boiled egg,water Snk ( AM):  L ( PM): Salad grilled chicken, water, 4 crackrs Snk ( PM): D ( PM): Northern beans, [place] and green beans,  Snk ( PM):  Beverages: water  Usual physical activity: ADL  Estimated energy needs: 1800 calories 200 g carbohydrates 135 g protein 50 g fat  Progress Towards Goal(s):  In progress.   Nutritional Diagnosis:  NB-1.1 Food and nutrition-related knowledge deficit As related to Diabetes and OBesity.  As evidenced by A1C 8.9% and BMI 44.    Intervention:  Nutrition and Diabetes education provided on My Plate, CHO counting, meal  planning, portion sizes, timing of meals, avoiding snacks between meals unless having a low blood sugar, target ranges for A1C and blood sugars, signs/symptoms and treatment of hyper/hypoglycemia, monitoring blood sugars, taking medications as prescribed, benefits of exercising 30 minutes per day and prevention of complications of DM.   Goals   Lose 10 lbs in 6 months Check into Silver Sneakers program Watch portion sizes Increase lower carb vegetables Avoid eating after 7 pm Prevent hypoglycemia  Teaching Method Utilized:  Visual Auditory Hands on  Handouts given during visit include: Weight loss tips. Increasing activity  Barriers to learning/adherence to lifestyle change: none  Demonstrated degree of understanding via:  Teach Back   Monitoring/Evaluation:  Dietary intake, exercise,  and body weight in 3 month(s). Refer to  Would benefit from referral to a  nephrologist since is CKD is more chronic vs acute. Also would benefit from a GLP for needed weight loss.  >>>>Recommend to reduce Lantus to 12 units ONCE a day and see how his blood sugars respond. Do not recommend 2 injections daily with such low dose of long acting insulin.

## 2021-01-22 NOTE — Patient Instructions (Addendum)
Goals   Lose 10 lbs in 6 months Check into Silver Sneakers program Watch portion sizes Increase lower carb vegetables Avoid eating after 7 pm Prevent hypoglycemia

## 2021-02-04 ENCOUNTER — Encounter: Payer: Self-pay | Admitting: Nutrition

## 2021-02-20 ENCOUNTER — Ambulatory Visit (HOSPITAL_COMMUNITY): Payer: Medicare Other

## 2021-02-20 ENCOUNTER — Inpatient Hospital Stay (HOSPITAL_COMMUNITY): Payer: Medicare Other

## 2021-02-27 ENCOUNTER — Ambulatory Visit (HOSPITAL_COMMUNITY): Payer: Medicare Other | Admitting: Hematology

## 2021-03-07 ENCOUNTER — Inpatient Hospital Stay (HOSPITAL_COMMUNITY): Payer: Medicare Other | Attending: Hematology

## 2021-03-07 ENCOUNTER — Ambulatory Visit (HOSPITAL_COMMUNITY)
Admission: RE | Admit: 2021-03-07 | Discharge: 2021-03-07 | Disposition: A | Discharge status: Home / Self Care | Payer: Medicare Other | Source: Ambulatory Visit | Attending: Hematology | Admitting: Hematology

## 2021-03-07 ENCOUNTER — Other Ambulatory Visit: Payer: Self-pay

## 2021-03-07 DIAGNOSIS — C3491 Malignant neoplasm of unspecified part of right bronchus or lung: Secondary | ICD-10-CM

## 2021-03-07 LAB — CBC WITH DIFFERENTIAL/PLATELET
Abs Immature Granulocytes: 0.02 10*3/uL (ref 0.00–0.07)
Basophils Absolute: 0 10*3/uL (ref 0.0–0.1)
Basophils Relative: 1 %
Eosinophils Absolute: 0.1 10*3/uL (ref 0.0–0.5)
Eosinophils Relative: 1 %
HCT: 36.2 % — ABNORMAL LOW (ref 39.0–52.0)
Hemoglobin: 11.4 g/dL — ABNORMAL LOW (ref 13.0–17.0)
Immature Granulocytes: 0 %
Lymphocytes Relative: 26 %
Lymphs Abs: 2.2 10*3/uL (ref 0.7–4.0)
MCH: 29.8 pg (ref 26.0–34.0)
MCHC: 31.5 g/dL (ref 30.0–36.0)
MCV: 94.5 fL (ref 80.0–100.0)
Monocytes Absolute: 0.5 10*3/uL (ref 0.1–1.0)
Monocytes Relative: 6 %
Neutro Abs: 5.8 10*3/uL (ref 1.7–7.7)
Neutrophils Relative %: 66 %
Platelets: 264 10*3/uL (ref 150–400)
RBC: 3.83 MIL/uL — ABNORMAL LOW (ref 4.22–5.81)
RDW: 13 % (ref 11.5–15.5)
WBC: 8.7 10*3/uL (ref 4.0–10.5)
nRBC: 0 % (ref 0.0–0.2)

## 2021-03-07 LAB — COMPREHENSIVE METABOLIC PANEL
ALT: 25 U/L (ref 0–44)
AST: 21 U/L (ref 15–41)
Albumin: 4 g/dL (ref 3.5–5.0)
Alkaline Phosphatase: 72 U/L (ref 38–126)
Anion gap: 7 (ref 5–15)
BUN: 27 mg/dL — ABNORMAL HIGH (ref 8–23)
CO2: 23 mmol/L (ref 22–32)
Calcium: 9 mg/dL (ref 8.9–10.3)
Chloride: 108 mmol/L (ref 98–111)
Creatinine, Ser: 1.48 mg/dL — ABNORMAL HIGH (ref 0.61–1.24)
GFR, Estimated: 51 mL/min — ABNORMAL LOW (ref >60)
Glucose, Bld: 127 mg/dL — ABNORMAL HIGH (ref 70–99)
Potassium: 4 mmol/L (ref 3.5–5.1)
Sodium: 138 mmol/L (ref 135–145)
Total Bilirubin: 0.7 mg/dL (ref 0.3–1.2)
Total Protein: 7.7 g/dL (ref 6.5–8.1)

## 2021-03-07 MED ORDER — IOHEXOL 350 MG/ML SOLN
50.0000 mL | Freq: Once | INTRAVENOUS | Status: AC | PRN
  Administered 2021-03-07: 50 mL via INTRAVENOUS

## 2021-03-17 NOTE — Progress Notes (Signed)
[place], [place] [address]   CLINIC:  Medical Oncology/Hematology  PCP:  Lemmie Evens, MD [place]. / [place] [address] [phone]   REASON FOR VISIT:  Follow-up for right lung adenocarcinoma  PRIOR THERAPY: Right lobectomy and lymph node biopsy on 03/07/2019  NGS Results: not done  CURRENT THERAPY: surevillance  BRIEF ONCOLOGIC HISTORY:  Oncology History  Adenocarcinoma of right lung, stage 1 ([place])  03/22/2019 Initial Diagnosis   Adenocarcinoma of right lung, stage 1 ([place])   03/22/2019 Cancer Staging   Staging form: Lung, AJCC 8th Edition - Clinical: cT1, cN0, cM0 - Signed by Derek Jack, MD on 03/22/2019     CANCER STAGING: Cancer Staging Adenocarcinoma of right lung, stage 1 ([place]) Staging form: Lung, AJCC 8th Edition - Clinical: cT1, cN0, cM0 - Signed by Derek Jack, MD on 03/22/2019   INTERVAL HISTORY:  [name], a 70 y.o. male, returns for routine follow-up of his right lung adenocarcinoma. Gregory Lindsey was last seen on 08/14/2020.   Today Gregory Lindsey reports feeling good. Gregory Lindsey denies cough, SOB, infections, fever. 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 Gregory Lindsey reports occasional numbness in his feet.  REVIEW OF SYSTEMS:  Review of Systems  Constitutional:  Negative for appetite change, fatigue (70%) and fever.  Respiratory:  Negative for cough and shortness of breath.   Neurological:  Positive for numbness.  All other systems reviewed and are negative.  PAST MEDICAL/SURGICAL HISTORY:  Past Medical History:  Diagnosis Date   AKI (acute kidney injury) ([place]) 02/2019   Diabetes mellitus    Hypercholesterolemia    Hypertension    Pneumonia    Past Surgical History:  Procedure Laterality Date   CHOLECYSTECTOMY N/A 01/22/2016   Procedure: LAPAROSCOPIC CHOLECYSTECTOMY;  Surgeon: Aviva Signs, MD;  Location: [facility];  Service: General;  Laterality: N/A;   COLONOSCOPY  2007 [facility]   1 CM SIMPLE  ADENOMA   COLONOSCOPY N/A 04/26/2015   Procedure: COLONOSCOPY;  Surgeon: Danie Binder, MD;  Location: [facility];  Service: Endoscopy;  Laterality: N/A;  [place] (VATS)/ LOBECTOMY Right 03/07/2019   Procedure: VIDEO ASSISTED THORACOSCOPY (VATS)/RIGHT LOWER LOBECTOMY;  Surgeon: Lajuana Matte, MD;  Location: [facility];  Service: Thoracic;  Laterality: Right;   VIDEO BRONCHOSCOPY N/A 03/07/2019   Procedure: VIDEO BRONCHOSCOPY;  Surgeon: Lajuana Matte, MD;  Location: [facility];  Service: Thoracic;  Laterality: N/A;   VIDEO BRONCHOSCOPY WITH ENDOBRONCHIAL NAVIGATION N/A 02/22/2019   Procedure: VIDEO BRONCHOSCOPY WITH ENDOBRONCHIAL NAVIGATION WITH BIOPIES;  Surgeon: Lajuana Matte, MD;  Location: [facility] OR;  Service: Thoracic;  Laterality: N/A;    SOCIAL HISTORY:  Social History   Socioeconomic History   Marital status: Married    Spouse name: Not on file   Number of children: Not on file   Years of education: Not on file   Highest education level: Not on file  Occupational History   Occupation: retired  Tobacco Use   Smoking status: Former   Smokeless tobacco: Never  Scientific laboratory technician Use: Never used  Substance and Sexual Activity   Alcohol use: Yes    Alcohol/week: 0.0 standard drinks    Comment: beer 2-3 times per week, 2 shots of scotch on saturday night , rarely since June   Drug use: No   Sexual activity: Not Currently  Other Topics Concern   Not on file  Social History Narrative  Not on file   Social Determinants of Health   Financial Resource Strain: Not on file  Food Insecurity: Not on file  Transportation Needs: Not on file  Physical Activity: Not on file  Stress: Not on file  Social Connections: Not on file  Intimate Partner Violence: Not on file    FAMILY HISTORY:  Family History  Problem Relation Age of Onset   Cancer Paternal Grandfather    Colon cancer Neg Hx     CURRENT MEDICATIONS:  Current Outpatient  Medications  Medication Sig Dispense Refill   amLODipine (NORVASC) 10 MG tablet Take 1 tablet (10 mg total) by mouth daily. 30 tablet 3   aspirin 81 MG tablet Take 81 mg by mouth every evening.      blood glucose meter kit and supplies KIT Dispense based on patient and insurance preference. Use up to four times daily as directed. (FOR ICD-9 250.00, 250.01). 1 each 0   cyanocobalamin 2000 MCG tablet Take 2,000 mcg by mouth 2 (two) times daily.      ferrous sulfate 325 (65 FE) MG tablet Take 325 mg by mouth 2 (two) times daily.     insulin aspart protamine- aspart (NOVOLOG MIX 70/30) (70-30) 100 UNIT/ML injection Inject 0.08 mLs (8 Units total) into the skin 2 (two) times daily with a meal. (Patient taking differently: Inject 16 Units into the skin 2 (two) times daily with a meal.) 10 mL 11   metoprolol succinate (TOPROL-XL) 25 MG 24 hr tablet Take 25 mg by mouth daily.     Multiple Vitamin (MULTIVITAMIN WITH MINERALS) TABS tablet Take 1 tablet by mouth daily.     pravastatin (PRAVACHOL) 40 MG tablet Take 40 mg by mouth daily.      traMADol (ULTRAM) 50 MG tablet Take 1-2 tablets (50-100 mg total) by mouth every 6 (six) hours as needed (mild pain). 30 tablet 0   No current facility-administered medications for this visit.    ALLERGIES:  No Known Allergies  PHYSICAL EXAM:  Performance status (ECOG): 1 - Symptomatic but completely ambulatory  There were no vitals filed for this visit. Wt Readings from Last 3 Encounters:  01/22/21 (!) 307 lb 12.8 oz (139.6 kg)  09/23/20 (!) 307 lb (139.3 kg)  08/14/20 298 lb 3.2 oz (135.3 kg)   Physical Exam Vitals reviewed.  Constitutional:      Appearance: Normal appearance. Gregory Lindsey is obese.  Cardiovascular:     Rate and Rhythm: Normal rate and regular rhythm.     Pulses: Normal pulses.     Heart sounds: Normal heart sounds.  Pulmonary:     Effort: Pulmonary effort is normal.     Breath sounds: Normal breath sounds.  Neurological:     General: No  focal deficit present.     Mental Status: Gregory Lindsey is alert and oriented to person, place, and time.  Psychiatric:        Mood and Affect: Mood normal.        Behavior: Behavior normal.     LABORATORY DATA:  I have reviewed the labs as listed.  CBC Latest Ref Rng & Units 03/07/2021 08/07/2020 01/23/2020  WBC 4.0 - 10.5 K/uL 8.7 7.6 7.8  Hemoglobin 13.0 - 17.0 g/dL 11.4(L) 12.1(L) 12.2(L)  Hematocrit 39.0 - 52.0 % 36.2(L) 38.8(L) 38.7(L)  Platelets 150 - 400 K/uL 264 222 222   CMP Latest Ref Rng & Units 03/07/2021 08/07/2020 01/23/2020  Glucose 70 - 99 mg/dL 127(H) 85 91  BUN 8 - 23 mg/dL  27(H) 30(H) 25(H)  Creatinine 0.61 - 1.24 mg/dL 1.48(H) 1.58(H) 1.41(H)  Sodium 135 - 145 mmol/L 138 136 138  Potassium 3.5 - 5.1 mmol/L 4.0 4.2 3.8  Chloride 98 - 111 mmol/L 108 102 105  CO2 22 - 32 mmol/L 23 24 23   Calcium 8.9 - 10.3 mg/dL 9.0 9.2 9.2  Total Protein 6.5 - 8.1 g/dL 7.7 7.7 7.7  Total Bilirubin 0.3 - 1.2 mg/dL 0.7 0.7 1.0  Alkaline Phos 38 - 126 U/L 72 63 66  AST 15 - 41 U/L 21 23 23   ALT 0 - 44 U/L 25 26 23     DIAGNOSTIC IMAGING:  I have independently reviewed the scans and discussed with the patient. CT Chest W Contrast  Result Date: 03/10/2021 CLINICAL DATA:  Non-small cell lung cancer.  Restaging. EXAM: CT CHEST WITH CONTRAST TECHNIQUE: Multidetector CT imaging of the chest was performed during intravenous contrast administration. CONTRAST:  69m OMNIPAQUE IOHEXOL 350 MG/ML SOLN COMPARISON:  08/07/2020 FINDINGS: Cardiovascular: Normal heart size. Aortic atherosclerosis and LAD coronary artery calcification. Mediastinum/Nodes: No enlarged mediastinal, hilar, or axillary lymph nodes. Thyroid gland, trachea, and esophagus demonstrate no significant findings. Lungs/Pleura: Status post right lower lobectomy. No signs of locally recurrent tumor. 3 mm right upper lobe lung nodule is stable, image 69/4. 2 mm nodule in the left apex is unchanged, image 31/4. 3 mm nodule in the periphery of the left  upper lobe is unchanged, image 46/4. No new or enlarging lung nodules. Upper Abdomen: No acute abnormality. Cholecystectomy. Low-attenuation structure within the right hepatic lobe is again seen measuring 1.8 cm corresponding to benign liver hemangioma as characterized on recent MRI. Musculoskeletal: No chest wall abnormality. No acute or significant osseous findings. IMPRESSION: 1. No acute cardiopulmonary abnormalities. No specific findings identified to suggest residual/recurrent tumor or metastatic disease. 2. Aortic Atherosclerosis (ICD10-I70.0). Electronically Signed   By: TKerby MoorsM.D.   On: 03/10/2021 09:51     ASSESSMENT:  1.  Right lung adenocarcinoma (PT1CPN0): -Incidental right lung mass on a CT CAP done for abdominal pain. -Quit smoking 19 years ago, smoked 1/4 pack of cigarettes a day for 18 years followed by cigars and pipes for 10 years. - PET scan on 01/16/2019 shows 1.5 x 2.4 cm right lower lobe mass with SUV 3.2 with no adenopathy or metastatic disease. -MRI of the brain on 01/03/2019 was negative for metastatic disease. - Gregory Lindsey underwent right lobectomy and lymph node biopsy on 03/07/2019 which showed 2.3 cm adenocarcinoma, acinar predominant.  0/8 lymph nodes from level 7, 11, 9, 12 were negative.  Margins were negative.  No visceral pleural invasion.  No lymphovascular invasion. -CT chest on 01/23/2020 shows right lower lobectomy.  No evidence of recurrent or metastatic disease in the chest.   PLAN:  1.  Right lung adenocarcinoma (PT1CPN0): - Gregory Lindsey does not have any clinical signs or symptoms of recurrence.  No new cough or hemoptysis.  Gregory Lindsey quit smoking about 20 years ago. - Reviewed CT chest with contrast from 03/07/2021 which did not show any evidence of recurrence or metastatic disease. - RTC 6 months with repeat CT scan without contrast.   2.  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 - Repeat imaging was recommended in 2 years.  3.  Mild normocytic anemia: - Likely etiology includes CKD and relative  iron deficiency.  Creatinine is stable around 1.4. - We will check ferritin, iron panel at next visit.   Orders placed this encounter:  No orders of the defined types were placed in this encounter.    Derek Jack, MD [place] [phone]   I, Thana Ates, am acting as a scribe for Dr. Derek Jack.  I, Derek Jack MD, have reviewed the above documentation for accuracy and completeness, and I agree with the above.

## 2021-03-18 ENCOUNTER — Other Ambulatory Visit: Payer: Self-pay

## 2021-03-18 ENCOUNTER — Ambulatory Visit (HOSPITAL_COMMUNITY): Payer: Medicare Other | Admitting: Hematology

## 2021-03-18 ENCOUNTER — Inpatient Hospital Stay (HOSPITAL_COMMUNITY): Payer: Medicare Other | Attending: Hematology | Admitting: Hematology

## 2021-03-18 VITALS — BP 160/83 | HR 88 | Temp 97.0°F | Resp 18 | Wt 310.5 lb

## 2021-03-18 DIAGNOSIS — K862 Cyst of pancreas: Secondary | ICD-10-CM | POA: Diagnosis not present

## 2021-03-18 DIAGNOSIS — D649 Anemia, unspecified: Secondary | ICD-10-CM | POA: Insufficient documentation

## 2021-03-18 DIAGNOSIS — C3491 Malignant neoplasm of unspecified part of right bronchus or lung: Secondary | ICD-10-CM | POA: Diagnosis present

## 2021-03-18 DIAGNOSIS — Z87891 Personal history of nicotine dependence: Secondary | ICD-10-CM | POA: Diagnosis not present

## 2021-03-18 NOTE — Patient Instructions (Addendum)
[place] at [facility] Discharge Instructions  You were seen today by Dr. Delton Coombes. He went over your recent results and scans. You will be scheduled for a CT scan of your chest prior to your next appointment. Dr. Delton Coombes will see you back in 6 months for labs and follow up.   Thank you for choosing [place] at [facility] to provide your oncology and hematology care.  To afford each patient quality time with our provider, please arrive at least 15 minutes before your scheduled appointment time.   If you have a lab appointment with the [facility] please come in thru the Main Entrance and check in at the main information desk  You need to re-schedule your appointment should you arrive 10 or more minutes late.  We strive to give you quality time with our providers, and arriving late affects you and other patients whose appointments are after yours.  Also, if you no show three or more times for appointments you may be dismissed from the clinic at the providers discretion.     Again, thank you for choosing [facility].  Our hope is that these requests will decrease the amount of time that you wait before being seen by our physicians.       _____________________________________________________________  Should you have questions after your visit to [facility], please contact our office at [phone] between the hours of 8:00 a.m. and 4:30 p.m.  Voicemails left after 4:00 p.m. will not be returned until the following business day.  For prescription refill requests, have your pharmacy contact our office and allow 72 hours.    Cancer Center Support Programs:   > Cancer Support Group  2nd Tuesday of the month 1pm-2pm, Journey Room

## 2021-04-09 ENCOUNTER — Telehealth: Payer: Self-pay | Admitting: Nutrition

## 2021-04-09 NOTE — Telephone Encounter (Signed)
Patient dropped off his lab work from last MD visit. A1C 5.8%. BUN 27 high, Creat 1.58 H and eGFR is 47. Reviewed his recent eating choices. Encouraged more fresh fruits, vegetables and plant based foods. Avoid processed and high fat, high salt foods. His wife still does some cooking. Drink only water. Recommended he talk to PCP about referral to kidney doctor. He verbalized understanding. Suggested maybe trying Healthy Choice meals.

## 2021-07-11 IMAGING — MR MRI HEAD WITHOUT AND WITH CONTRAST
9 of 13 series · 31 of 48 positions shown · IV contrast (gadavist)
Comparison: No prior imaging of the brain available for comparison.

CLINICAL DATA: Right lower lobe lung mass, rule out Mets, staging
of lung cancer.

EXAM:
MRI HEAD WITHOUT AND WITH CONTRAST
TECHNIQUE: Multiplanar, multiecho pulse sequences of the brain and surrounding
structures were obtained without and with intravenous contrast.
CONTRAST:  10 mL intravenous Gadavist

[Series 2: T1 · sagittal · 5.0mm · 0.42mm/px · 2 of 20 slices shown]
[im 1/20]
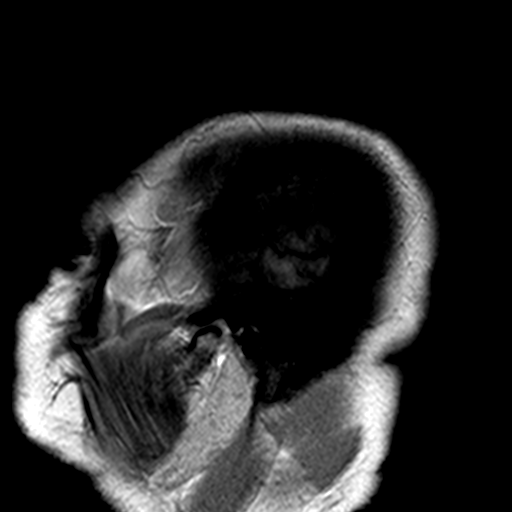
[im 20/20]
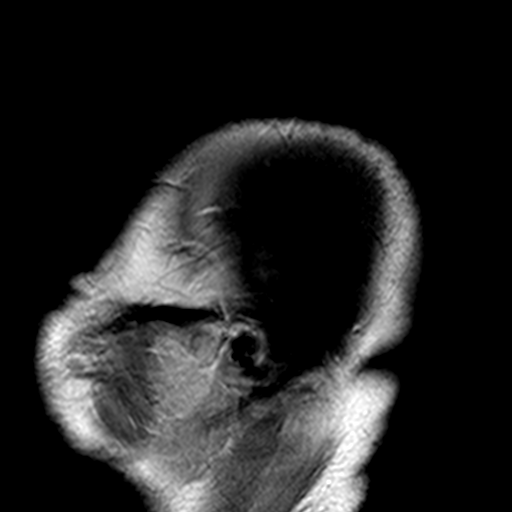

[Series 3: DWI · axial · 3.0mm · 0.74mm/px · z∈[-57,+103]mm · 5 of 56 slices shown (1 of 2)]
[im 1/56]
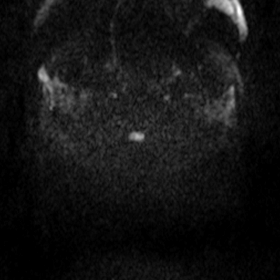
[im 14/56]
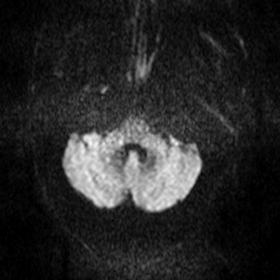
[im 28/56]
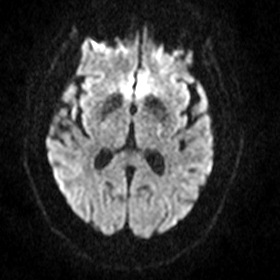
[im 42/56]
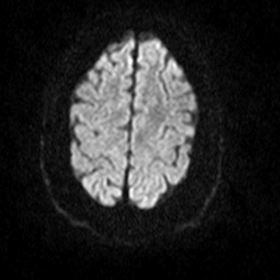
[im 56/56]
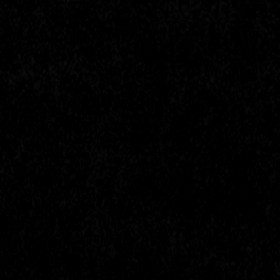

[Series 5: DWI · coronal · 5.0mm · 0.48mm/px · 3 of 34 slices shown (2 of 2)]
[im 1/34]
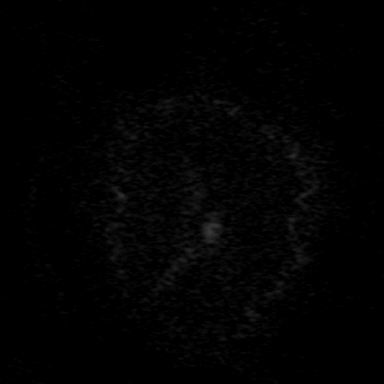
[im 17/34]
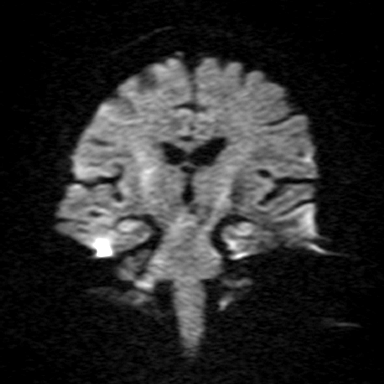
[im 34/34]
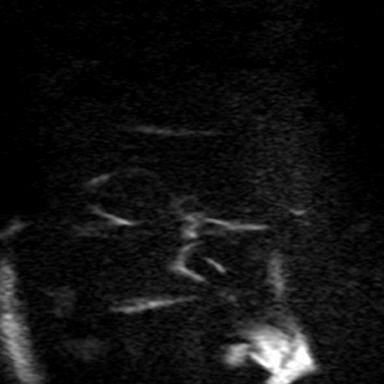

[Series 7: T2 · axial · 5.0mm · 0.69mm/px · z∈[-45,+96]mm · 2 of 23 slices shown (1 of 3)]
[im 1/23]
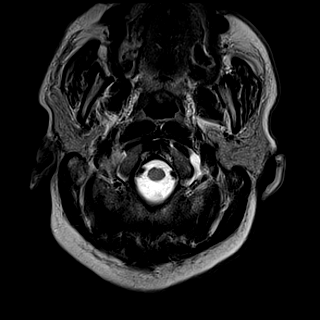
[im 23/23]
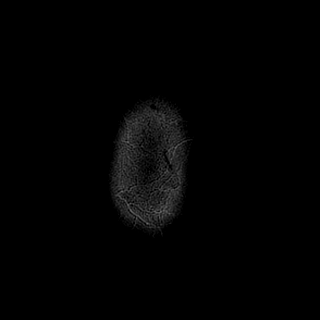

[Series 8: T2 · axial · 5.0mm · 0.41mm/px · z∈[-40,+88]mm · 2 of 21 slices shown (2 of 3)]
[im 1/21]
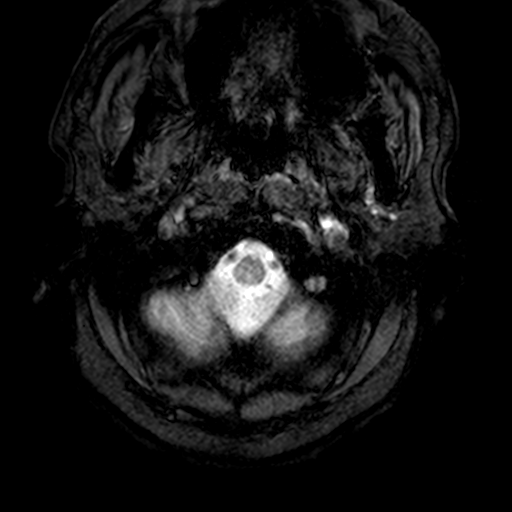
[im 21/21]
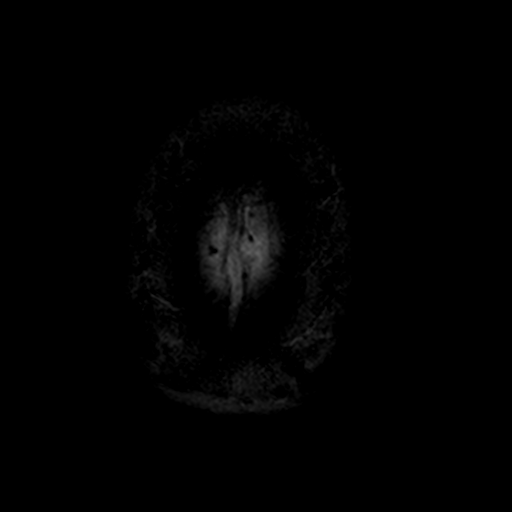

[Series 9: FLAIR · axial · 3.0mm · 0.85mm/px · z∈[-43,+93]mm · 4 of 47 slices shown]
[im 1/47]
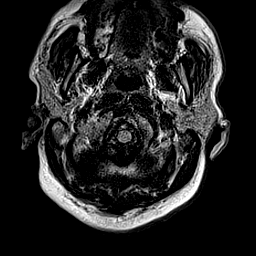
[im 16/47]
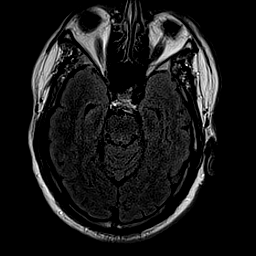
[im 31/47]
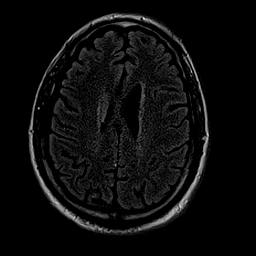
[im 47/47]
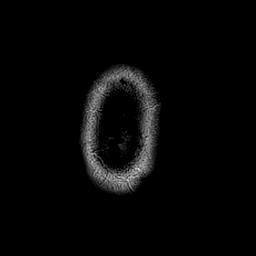

[Series 11: T1 post-contrast · axial · 2.0mm · 0.44mm/px · z∈[-53,+107]mm · 8 of 82 slices shown (1 of 2)]
[im 1/82]
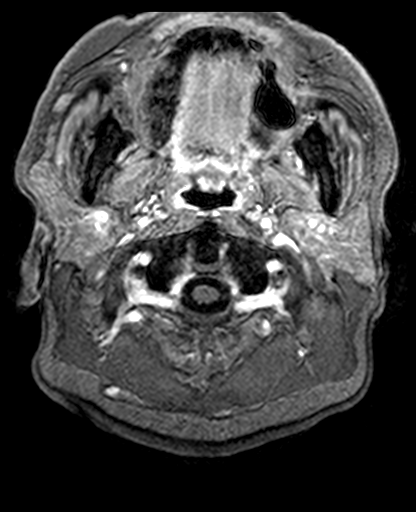
[im 12/82]
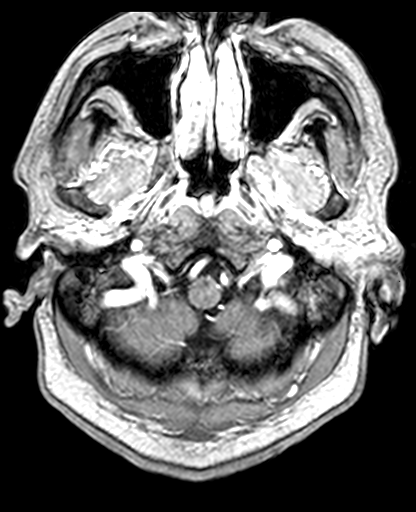
[im 24/82]
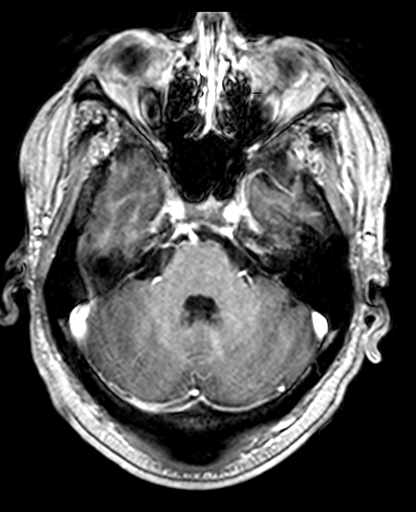
[im 35/82]
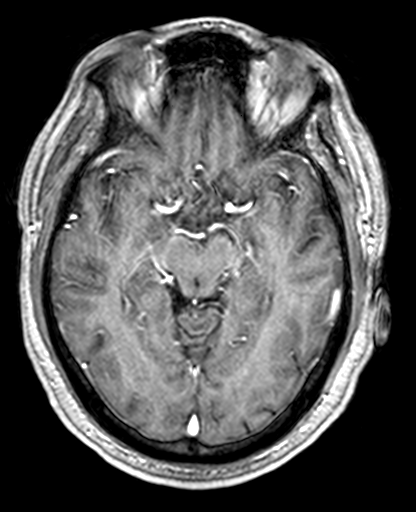
[im 47/82]
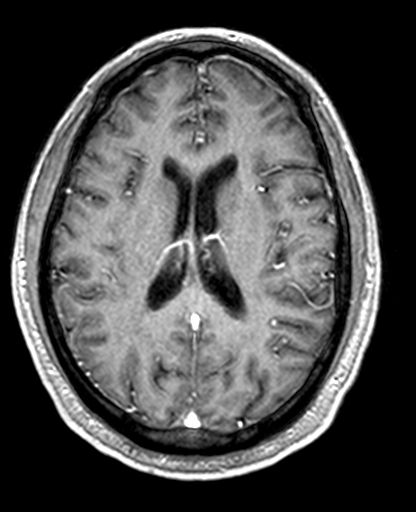
[im 58/82]
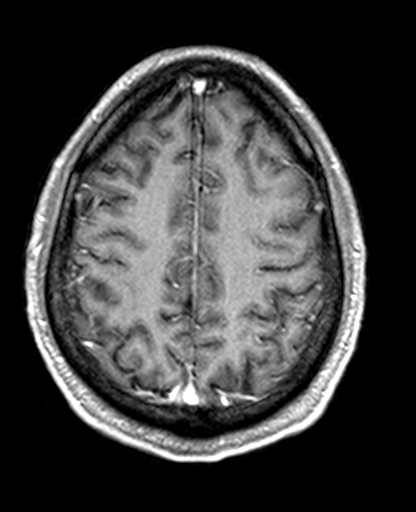
[im 70/82]
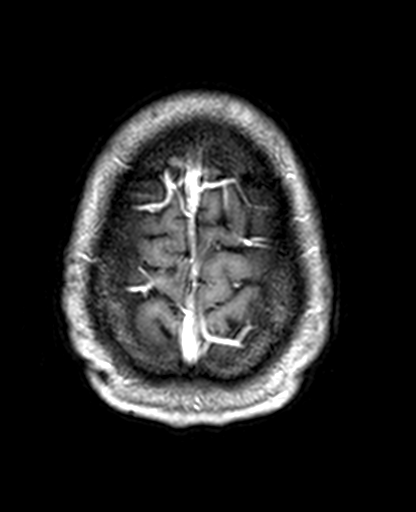
[im 82/82]
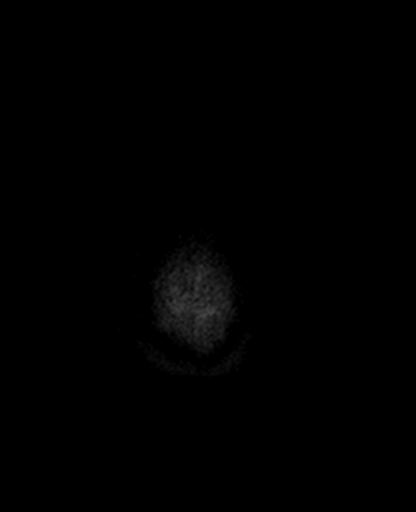

[Series 12: T1 post-contrast · coronal · 5.0mm · 0.40mm/px · 2 of 24 slices shown (2 of 2)]
[im 1/24]
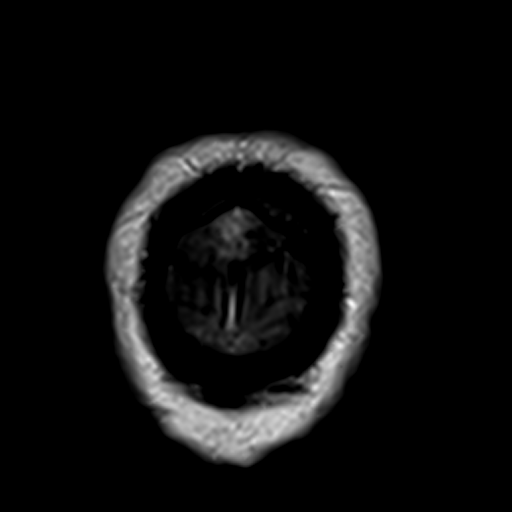
[im 24/24]
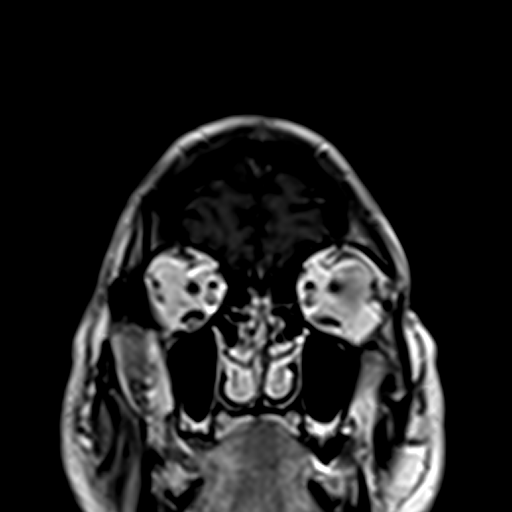

[Series 13: T2 · coronal · 5.0mm · 0.63mm/px · 3 of 28 slices shown (3 of 3)]
[im 1/28]
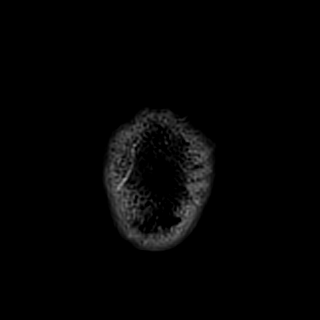
[im 14/28]
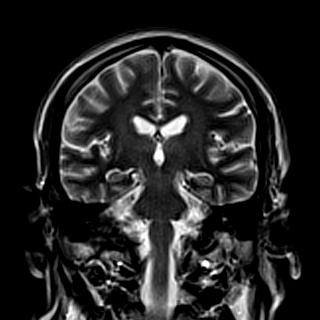
[im 28/28]
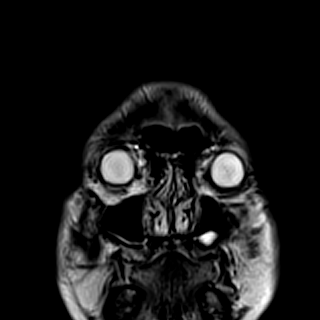

[31 of 48 positions shown; findings below may reference images not displayed]

FINDINGS: Brain: Mildly motion degraded examination. No enhancing lesions are
demonstrated to suggest intracranial metastatic disease. Minimal
ill-defined T2/FLAIR hyperintensity within the cerebral white matter
is nonspecific, but consistent with chronic small vessel ischemic
disease. No intracranial mass, intracranial hemorrhage, midline
shift or extra-axial collection. Cerebral volume is age appropriate.

Vascular: Flow voids within the proximal large vessels.

Skull and upper cervical spine: Normal calvarial marrow signal.
Nonspecific mildly heterogeneous marrow signal within the imaged
upper cervical spine.

Sinuses/Orbits: The globes and orbits are normal. Small bilateral
maxillary sinus mucous retention cysts. No significant mastoid
effusion.
IMPRESSION: No evidence of intracranial metastatic disease.

Minimal scattered cerebral white matter signal abnormality is
nonspecific, but consistent with chronic small vessel ischemic
disease.

## 2021-07-16 ENCOUNTER — Emergency Department (HOSPITAL_COMMUNITY)
Admission: EM | Admit: 2021-07-16 | Discharge: 2021-07-16 | Disposition: A | Discharge status: Home / Self Care | Payer: Medicare Other | Attending: Emergency Medicine | Admitting: Emergency Medicine

## 2021-07-16 ENCOUNTER — Encounter (HOSPITAL_COMMUNITY): Payer: Self-pay

## 2021-07-16 ENCOUNTER — Emergency Department (HOSPITAL_COMMUNITY): Payer: Medicare Other

## 2021-07-16 ENCOUNTER — Other Ambulatory Visit: Payer: Self-pay

## 2021-07-16 DIAGNOSIS — Z85118 Personal history of other malignant neoplasm of bronchus and lung: Secondary | ICD-10-CM | POA: Insufficient documentation

## 2021-07-16 DIAGNOSIS — M5441 Lumbago with sciatica, right side: Secondary | ICD-10-CM | POA: Diagnosis present

## 2021-07-16 DIAGNOSIS — E119 Type 2 diabetes mellitus without complications: Secondary | ICD-10-CM | POA: Diagnosis not present

## 2021-07-16 DIAGNOSIS — I10 Essential (primary) hypertension: Secondary | ICD-10-CM | POA: Diagnosis not present

## 2021-07-16 DIAGNOSIS — Z7982 Long term (current) use of aspirin: Secondary | ICD-10-CM | POA: Diagnosis not present

## 2021-07-16 DIAGNOSIS — M5431 Sciatica, right side: Secondary | ICD-10-CM

## 2021-07-16 DIAGNOSIS — Z794 Long term (current) use of insulin: Secondary | ICD-10-CM | POA: Diagnosis not present

## 2021-07-16 DIAGNOSIS — M25551 Pain in right hip: Secondary | ICD-10-CM | POA: Diagnosis not present

## 2021-07-16 DIAGNOSIS — Z79899 Other long term (current) drug therapy: Secondary | ICD-10-CM | POA: Insufficient documentation

## 2021-07-16 LAB — CBG MONITORING, ED: Glucose-Capillary: 147 mg/dL — ABNORMAL HIGH (ref 70–99)

## 2021-07-16 MED ORDER — NAPROXEN 500 MG PO TABS: 500.0000 mg | ORAL_TABLET | Freq: Two times a day (BID) | ORAL | 0 refills | Status: AC

## 2021-07-16 MED ORDER — LIDOCAINE 5 % EX PTCH
1.0000 | MEDICATED_PATCH | CUTANEOUS | Status: DC
  Administered 2021-07-16: 1 via TRANSDERMAL
  Filled 2021-07-16: qty 1

## 2021-07-16 MED ORDER — HYDROCODONE-ACETAMINOPHEN 5-325 MG PO TABS: 1.0000 | ORAL_TABLET | Freq: Four times a day (QID) | ORAL | 0 refills | Status: AC | PRN

## 2021-07-16 MED ORDER — NAPROXEN 250 MG PO TABS
500.0000 mg | ORAL_TABLET | Freq: Once | ORAL | Status: AC
  Administered 2021-07-16: 500 mg via ORAL
  Filled 2021-07-16: qty 2

## 2021-07-16 MED ORDER — HYDROCODONE-ACETAMINOPHEN 5-325 MG PO TABS
1.0000 | ORAL_TABLET | Freq: Once | ORAL | Status: AC
  Administered 2021-07-16: 1 via ORAL
  Filled 2021-07-16: qty 1

## 2021-07-16 MED ORDER — LIDOCAINE 5 % EX PTCH: 1.0000 | MEDICATED_PATCH | CUTANEOUS | 0 refills | Status: AC

## 2021-07-16 NOTE — Discharge Instructions (Addendum)
Take the medications as directed.  Do not drive within 4 hours of taking hydrocodone as this will make you drowsy.  Avoid lifting,  Bending,  Twisting or any other activity that worsens your pain over the next week.  Apply a heating pad to your lower back for 20 minutes 3 times daily.   You should get rechecked if your symptoms are not better over the next 5 days,  Or you develop increased pain,  Weakness in your leg(s) or loss of bladder or bowel function - these can be symptoms of a worsening problem with your lower back.

## 2021-07-16 NOTE — ED Triage Notes (Signed)
Pt presents to ED with complaint of right hip pain radiating down leg since Saturday.

## 2021-07-16 NOTE — ED Provider Notes (Signed)
[facility] EMERGENCY DEPARTMENT Provider Note   CSN: [id number] Arrival date & time: 07/16/21  1433     History  Chief Complaint  Patient presents with   Hip Pain    Gregory Lindsey is a 71 y.o. male with a history including hypertension, type 2 diabetes and dyslipidemia, also history of occasional low back pain presenting for evaluation of exacerbation of back pain which is radiating into his right hip region.  He denies any injuries, falls, overuse.  He states he woke up Saturday morning with pain that radiates from his right lower back region into his right buttock region.  He describes a burning type pain which is constant but worse with movement and certain positions.  He denies weakness or numbness in his lower extremities.  He has had no urinary or fecal incontinence or retention.  No fevers or chills.  No history of IV drug use.  He does have a history of stage I lung cancer, with lobectomy in 2020.  CT imaging for surveillance completed 9/22 was negative for any recurrence of metastatic disease.  Patient has taken ibuprofen with no significant improvement in his pain symptoms.  The history is provided by the patient.      Home Medications Prior to Admission medications   Medication Sig Start Date End Date Taking? Authorizing Provider  HYDROcodone-acetaminophen (NORCO/VICODIN) 5-325 MG tablet Take 1 tablet by mouth every 6 (six) hours as needed. 07/16/21  Yes Sumaiya Arruda, Almyra Free, PA-C  lidocaine (LIDODERM) 5 % Place 1 patch onto the skin daily. Remove & Discard patch within 12 hours or as directed by MD 07/16/21  Yes Ulanda Tackett, Almyra Free, PA-C  naproxen (NAPROSYN) 500 MG tablet Take 1 tablet (500 mg total) by mouth 2 (two) times daily. 07/16/21  Yes Brinnley Lacap, Almyra Free, PA-C  amLODipine (NORVASC) 10 MG tablet Take 1 tablet (10 mg total) by mouth daily. 03/10/19   Barrett, Lodema Hong, PA-C  aspirin 81 MG tablet Take 81 mg by mouth every evening.     [provider]  blood glucose meter kit and supplies KIT  Dispense based on patient and insurance preference. Use up to four times daily as directed. (FOR ICD-9 250.00, 250.01). 03/09/19   Barrett, Erin R, PA-C  cyanocobalamin 2000 MCG tablet Take 2,000 mcg by mouth 2 (two) times daily.     [provider]  ferrous sulfate 325 (65 FE) MG tablet Take 325 mg by mouth 2 (two) times daily.    [provider]  insulin aspart protamine- aspart (NOVOLOG MIX 70/30) (70-30) 100 UNIT/ML injection Inject 0.08 mLs (8 Units total) into the skin 2 (two) times daily with a meal. Patient taking differently: Inject 16 Units into the skin 2 (two) times daily with a meal. 03/09/19   Barrett, Erin R, PA-C  metoprolol succinate (TOPROL-XL) 25 MG 24 hr tablet Take 25 mg by mouth daily. 12/26/19   [provider]  Multiple Vitamin (MULTIVITAMIN WITH MINERALS) TABS tablet Take 1 tablet by mouth daily.    [provider]  pravastatin (PRAVACHOL) 40 MG tablet Take 40 mg by mouth daily.  03/20/15   [provider]      Allergies    Patient has no known allergies.    Review of Systems   Review of Systems  Constitutional:  Negative for chills and fever.  Respiratory:  Negative for shortness of breath.   Cardiovascular:  Negative for chest pain and leg swelling.  Gastrointestinal:  Negative for abdominal distention, abdominal pain and  constipation.  Genitourinary:  Negative for difficulty urinating, dysuria, flank pain, frequency and urgency.  Musculoskeletal:  Positive for back pain. Negative for gait problem and joint swelling.  Skin:  Negative for rash.  Neurological:  Negative for weakness and numbness.  All other systems reviewed and are negative.  Physical Exam Updated Vital Signs BP (!) 144/68 (BP Location: Left Arm)    Pulse 72    Temp 98.4 F (36.9 C) (Oral)    Resp 16    Ht _0  (1.778 m)    Wt (!) 140.6 kg    SpO2 96%    BMI 44.48 kg/m  Physical Exam Vitals and nursing note reviewed.  Constitutional:      Appearance:  He is well-developed.  HENT:     Head: Normocephalic.  Eyes:     Conjunctiva/sclera: Conjunctivae normal.  Cardiovascular:     Rate and Rhythm: Normal rate.     Comments: Pedal pulses normal. Pulmonary:     Effort: Pulmonary effort is normal.  Abdominal:     General: Bowel sounds are normal. There is no distension.     Palpations: Abdomen is soft. There is no mass.  Musculoskeletal:        General: Tenderness present. No deformity. Normal range of motion.     Cervical back: Normal range of motion and neck supple.     Lumbar back: Tenderness present. No swelling, edema or spasms. Negative right straight leg raise test and negative left straight leg raise test.     Comments: Tender to palpation right paralumbar region, this tenderness radiates along his right upper buttock ending near his right hip greater trochanter.  There is no palpable deformity.  No significant midline lumbar tenderness.  Skin:    General: Skin is warm and dry.  Neurological:     Mental Status: He is alert.     Sensory: No sensory deficit.     Motor: No tremor or atrophy.     Gait: Gait normal.     Deep Tendon Reflexes:     Reflex Scores:      Patellar reflexes are 2+ on the right side and 2+ on the left side.    Comments: No strength deficit noted in hip and knee flexor and extensor muscle groups.  Ankle flexion and extension intact.    ED Results / Procedures / Treatments   Labs (all labs ordered are listed, but only abnormal results are displayed) Labs Reviewed  CBG MONITORING, ED - Abnormal; Notable for the following components:      Result Value   Glucose-Capillary 147 (*)    All other components within normal limits    EKG None  Radiology DG Lumbar Spine Complete  Result Date: 07/16/2021 CLINICAL DATA:  Low back and right hip pain for several days, initial encounter EXAM: LUMBAR SPINE - COMPLETE 4+ VIEW COMPARISON:  None. FINDINGS: Five lumbar type vertebral bodies are well visualized. Vertebral  body height is well maintained. No pars defects are noted. Mild degenerative anterolisthesis of L4 on L5 is noted. Facet hypertrophic changes are seen as well as osteophytic changes. No soft tissue abnormality is noted. IMPRESSION: Degenerative change with anterolisthesis of L4 on L5. Electronically Signed   By: Inez Catalina M.D.   On: 07/16/2021 22:56   DG Hip Unilat W or Wo Pelvis 2-3 Views Right  Result Date: 07/16/2021 CLINICAL DATA:  pain EXAM: DG HIP (WITH OR WITHOUT PELVIS) 2-3V RIGHT COMPARISON:  None. FINDINGS: There is no evidence of  hip fracture or dislocation of the right hip. No acute displaced fracture or diastasis of the bones of the pelvis. Left hip is grossly unremarkable on frontal view. There is no evidence of arthropathy or other focal bone abnormality. IMPRESSION: Negative. Electronically Signed   By: Iven Finn M.D.   On: 07/16/2021 15:45    Procedures Procedures    Medications Ordered in ED Medications  naproxen (NAPROSYN) tablet 500 mg (500 mg Oral Given 07/16/21 2214)  HYDROcodone-acetaminophen (NORCO/VICODIN) 5-325 MG per tablet 1 tablet (1 tablet Oral Given 07/16/21 2215)    ED Course/ Medical Decision Making/ A&P                           Medical Decision Making Patient with acute on intermittent chronic lumbar pain with radiculopathy to right greater trochanter region.  He has no neurodeficits based on history or exam today.  Imaging was obtained.  Amount and/or Complexity of Data Reviewed External Data Reviewed: notes.    Details: Dr. Tomie China last OV note and imaging. Radiology: ordered.    Details: Right hip and lumbar spine were imaged, there are no bony lesions.  He does have some mild lumbar degenerative changes.  Risk OTC drugs. Prescription drug management. Decision regarding hospitalization. Risk Details: No indication for hospitalization at this time.  He has no neurodeficits on exam.  He was prescribed naproxen, hydrocodone with precautions  given regarding sedation.  He was also given a Lidoderm patch and a prescription for additional patches if he finds this medication is helpful.  We discussed other home treatments including activity as tolerated, heat therapy.  Return precautions were discussed.  He was advised follow-up with his PCP for recheck if symptoms are not improving over the next week.           Final Clinical Impression(s) / ED Diagnoses Final diagnoses:  Sciatica of right side    Rx / DC Orders ED Discharge Orders          Ordered    naproxen (NAPROSYN) 500 MG tablet  2 times daily        07/16/21 2302    HYDROcodone-acetaminophen (NORCO/VICODIN) 5-325 MG tablet  Every 6 hours PRN        07/16/21 2302    lidocaine (LIDODERM) 5 %  Every 24 hours        07/16/21 2302              Evalee [place], PA-C 07/17/21 1951    Daleen Bo, MD 07/20/21 [phone]

## 2021-07-24 ENCOUNTER — Ambulatory Visit: Payer: Medicare Other | Admitting: Nutrition

## 2021-09-16 ENCOUNTER — Inpatient Hospital Stay (HOSPITAL_COMMUNITY): Payer: Medicare Other | Attending: Hematology

## 2021-09-16 ENCOUNTER — Ambulatory Visit (HOSPITAL_COMMUNITY)
Admission: RE | Admit: 2021-09-16 | Discharge: 2021-09-16 | Disposition: A | Discharge status: Home / Self Care | Payer: Medicare Other | Source: Ambulatory Visit | Attending: Hematology | Admitting: Hematology

## 2021-09-16 DIAGNOSIS — D649 Anemia, unspecified: Secondary | ICD-10-CM | POA: Insufficient documentation

## 2021-09-16 DIAGNOSIS — C3491 Malignant neoplasm of unspecified part of right bronchus or lung: Secondary | ICD-10-CM | POA: Insufficient documentation

## 2021-09-16 DIAGNOSIS — E119 Type 2 diabetes mellitus without complications: Secondary | ICD-10-CM | POA: Diagnosis not present

## 2021-09-16 DIAGNOSIS — Z87891 Personal history of nicotine dependence: Secondary | ICD-10-CM | POA: Diagnosis not present

## 2021-09-16 DIAGNOSIS — Z85118 Personal history of other malignant neoplasm of bronchus and lung: Secondary | ICD-10-CM | POA: Insufficient documentation

## 2021-09-16 DIAGNOSIS — I10 Essential (primary) hypertension: Secondary | ICD-10-CM | POA: Insufficient documentation

## 2021-09-16 LAB — COMPREHENSIVE METABOLIC PANEL
ALT: 22 U/L (ref 0–44)
AST: 19 U/L (ref 15–41)
Albumin: 4.3 g/dL (ref 3.5–5.0)
Alkaline Phosphatase: 69 U/L (ref 38–126)
Anion gap: 9 (ref 5–15)
BUN: 38 mg/dL — ABNORMAL HIGH (ref 8–23)
CO2: 24 mmol/L (ref 22–32)
Calcium: 9.1 mg/dL (ref 8.9–10.3)
Chloride: 103 mmol/L (ref 98–111)
Creatinine, Ser: 1.91 mg/dL — ABNORMAL HIGH (ref 0.61–1.24)
GFR, Estimated: 38 mL/min — ABNORMAL LOW (ref >60)
Glucose, Bld: 154 mg/dL — ABNORMAL HIGH (ref 70–99)
Potassium: 3.6 mmol/L (ref 3.5–5.1)
Sodium: 136 mmol/L (ref 135–145)
Total Bilirubin: 0.8 mg/dL (ref 0.3–1.2)
Total Protein: 7.9 g/dL (ref 6.5–8.1)

## 2021-09-16 LAB — IRON AND TIBC
Iron: 131 ug/dL (ref 45–182)
Saturation Ratios: 48 % — ABNORMAL HIGH (ref 17.9–39.5)
TIBC: 272 ug/dL (ref 250–450)
UIBC: 141 ug/dL

## 2021-09-16 LAB — CBC WITH DIFFERENTIAL/PLATELET
Abs Immature Granulocytes: 0.02 10*3/uL (ref 0.00–0.07)
Basophils Absolute: 0 10*3/uL (ref 0.0–0.1)
Basophils Relative: 0 %
Eosinophils Absolute: 0.1 10*3/uL (ref 0.0–0.5)
Eosinophils Relative: 2 %
HCT: 38.4 % — ABNORMAL LOW (ref 39.0–52.0)
Hemoglobin: 12.2 g/dL — ABNORMAL LOW (ref 13.0–17.0)
Immature Granulocytes: 0 %
Lymphocytes Relative: 30 %
Lymphs Abs: 2.7 10*3/uL (ref 0.7–4.0)
MCH: 29.4 pg (ref 26.0–34.0)
MCHC: 31.8 g/dL (ref 30.0–36.0)
MCV: 92.5 fL (ref 80.0–100.0)
Monocytes Absolute: 0.7 10*3/uL (ref 0.1–1.0)
Monocytes Relative: 8 %
Neutro Abs: 5.5 10*3/uL (ref 1.7–7.7)
Neutrophils Relative %: 60 %
Platelets: 229 10*3/uL (ref 150–400)
RBC: 4.15 MIL/uL — ABNORMAL LOW (ref 4.22–5.81)
RDW: 12.6 % (ref 11.5–15.5)
WBC: 9.1 10*3/uL (ref 4.0–10.5)
nRBC: 0 % (ref 0.0–0.2)

## 2021-09-16 LAB — FERRITIN: Ferritin: 967 ng/mL — ABNORMAL HIGH (ref 24–336)

## 2021-09-23 ENCOUNTER — Inpatient Hospital Stay (HOSPITAL_BASED_OUTPATIENT_CLINIC_OR_DEPARTMENT_OTHER): Payer: Medicare Other | Admitting: Hematology

## 2021-09-23 VITALS — BP 160/73 | HR 76 | Temp 97.0°F | Resp 18 | Ht 70.0 in | Wt 309.0 lb

## 2021-09-23 DIAGNOSIS — D649 Anemia, unspecified: Secondary | ICD-10-CM | POA: Diagnosis not present

## 2021-09-23 DIAGNOSIS — C3491 Malignant neoplasm of unspecified part of right bronchus or lung: Secondary | ICD-10-CM

## 2021-09-23 DIAGNOSIS — D508 Other iron deficiency anemias: Secondary | ICD-10-CM

## 2021-09-23 NOTE — Patient Instructions (Addendum)
[place] at [facility] ?Discharge Instructions ? ? ?You were seen and examined today by Dr. Delton Coombes. ? ?He reviewed the results of your CT scan which was normal. ? ?Return as scheduled in 6 months.  ? ? ?Thank you for choosing [place] at [facility] to provide your oncology and hematology care.  To afford each patient quality time with our provider, please arrive at least 15 minutes before your scheduled appointment time.  ? ?If you have a lab appointment with the [facility] please come in thru the Main Entrance and check in at the main information desk. ? ?You need to re-schedule your appointment should you arrive 10 or more minutes late.  We strive to give you quality time with our providers, and arriving late affects you and other patients whose appointments are after yours.  Also, if you no show three or more times for appointments you may be dismissed from the clinic at the providers discretion.     ?Again, thank you for choosing [facility].  Our hope is that these requests will decrease the amount of time that you wait before being seen by our physicians.       ?_____________________________________________________________ ? ?Should you have questions after your visit to [facility], please contact our office at [phone] and follow the prompts.  Our office hours are 8:00 a.m. and 4:30 p.m. Monday - Friday.  Please note that voicemails left after 4:00 p.m. may not be returned until the following business day.  We are closed weekends and major holidays.  You do have access to a nurse 24-7, just call the main number to the clinic [phone] and do not press any options, hold on the line and a nurse will answer the phone.   ? ?For prescription refill requests, have your pharmacy contact our office and allow 72 hours.   ? ?Due to Covid, you will need to wear a mask upon entering the hospital. If you do not have a mask, a  mask will be given to you at the Main Entrance upon arrival. For doctor visits, patients may have 1 support person age 44 or older with them. For treatment visits, patients can not have anyone with them due to social distancing guidelines and our immunocompromised population.  ? ?   ?

## 2021-09-23 NOTE — Progress Notes (Signed)
? ?[facility] ?[address] ?[place], [place] [address] ? ? ?CLINIC:  ?Medical Oncology/Hematology ? ?PCP:  ?Lemmie Evens, MD ?[address] [place] [address] ?[phone] ? ? ?REASON FOR VISIT:  ?Follow-up for right lung adenocarcinoma ? ?PRIOR THERAPY: Right lobectomy and lymph node biopsy on 03/07/2019 ? ?NGS Results: not done ? ?CURRENT THERAPY: surevillance ? ?BRIEF ONCOLOGIC HISTORY:  ?Oncology History  ?Adenocarcinoma of right lung, stage 1 ([place])  ?03/22/2019 Initial Diagnosis  ? Adenocarcinoma of right lung, stage 1 ([place]) ?  ?03/22/2019 Cancer Staging  ? Staging form: Lung, AJCC 8th Edition ?- Clinical: cT1, cN0, cM0 - Signed by Derek Jack, MD on 03/22/2019 ? ?  ? ? ?CANCER STAGING: ? Cancer Staging  ?Adenocarcinoma of right lung, stage 1 ([place]) ?Staging form: Lung, AJCC 8th Edition ?- Clinical: cT1, cN0, cM0 - Signed by Derek Jack, MD on 03/22/2019 ? ? ?INTERVAL HISTORY:  ?[name], a 71 y.o. male, returns for routine follow-up of his right lung adenocarcinoma. Kenna was last seen on 03/18/2021.  ? ?Today he reports feeling good. He denies any fevers, infections, hematuria, hematochezia, black stools, and weight loss. He is taking an iron tablet BID.  ? ?REVIEW OF SYSTEMS:  ?Review of Systems  ?Constitutional:  Negative for appetite change, fatigue, fever and unexpected weight change.  ?Gastrointestinal:  Negative for blood in stool.  ?Neurological:  Positive for numbness (R arm).  ?All other systems reviewed and are negative. ? ?PAST MEDICAL/SURGICAL HISTORY:  ?Past Medical History:  ?Diagnosis Date  ? AKI (acute kidney injury) ([place]) 02/2019  ? Diabetes mellitus   ? Hypercholesterolemia   ? Hypertension   ? Pneumonia   ? ?Past Surgical History:  ?Procedure Laterality Date  ? CHOLECYSTECTOMY N/A 01/22/2016  ? Procedure: LAPAROSCOPIC CHOLECYSTECTOMY;  Surgeon: Aviva Signs, MD;  Location: [facility] ORS;  Service: General;  Laterality: N/A;  ? COLONOSCOPY  2007 [facility]   ? 1 CM SIMPLE ADENOMA  ? COLONOSCOPY N/A 04/26/2015  ? Procedure: COLONOSCOPY;  Surgeon: Danie Binder, MD;  Location: [facility] ENDO SUITE;  Service: Endoscopy;  Laterality: N/A;  930 ?  ? VIDEO ASSISTED THORACOSCOPY (VATS)/ LOBECTOMY Right 03/07/2019  ? Procedure: VIDEO ASSISTED THORACOSCOPY (VATS)/RIGHT LOWER LOBECTOMY;  Surgeon: Lajuana Matte, MD;  Location: [facility];  Service: Thoracic;  Laterality: Right;  ? VIDEO BRONCHOSCOPY N/A 03/07/2019  ? Procedure: VIDEO BRONCHOSCOPY;  Surgeon: Lajuana Matte, MD;  Location: [facility];  Service: Thoracic;  Laterality: N/A;  ? VIDEO BRONCHOSCOPY WITH ENDOBRONCHIAL NAVIGATION N/A 02/22/2019  ? Procedure: VIDEO BRONCHOSCOPY WITH ENDOBRONCHIAL NAVIGATION WITH BIOPIES;  Surgeon: Lajuana Matte, MD;  Location: [facility];  Service: Thoracic;  Laterality: N/A;  ? ? ?SOCIAL HISTORY:  ?Social History  ? ?Socioeconomic History  ? Marital status: Married  ?  Spouse name: Not on file  ? Number of children: Not on file  ? Years of education: Not on file  ? Highest education level: Not on file  ?Occupational History  ? Occupation: retired  ?Tobacco Use  ? Smoking status: Former  ? Smokeless tobacco: Never  ?Vaping Use  ? Vaping Use: Never used  ?Substance and Sexual Activity  ? Alcohol use: Yes  ?  Alcohol/week: 0.0 standard drinks  ?  Comment: beer 2-3 times per week, 2 shots of scotch on saturday night , rarely since June  ? Drug use: No  ? Sexual activity: Not Currently  ?Other Topics Concern  ? Not on file  ?Social  History Narrative  ? Not on file  ? ?Social Determinants of Health  ? ?Financial Resource Strain: Not on file  ?Food Insecurity: Not on file  ?Transportation Needs: Not on file  ?Physical Activity: Not on file  ?Stress: Not on file  ?Social Connections: Not on file  ?Intimate Partner Violence: Not on file  ? ? ?FAMILY HISTORY:  ?Family History  ?Problem Relation Age of Onset  ? Cancer Paternal Grandfather   ? Colon cancer Neg Hx   ? ? ?CURRENT MEDICATIONS:  ?Current  Outpatient Medications  ?Medication Sig Dispense Refill  ? amLODipine (NORVASC) 10 MG tablet Take 1 tablet (10 mg total) by mouth daily. 30 tablet 3  ? aspirin 81 MG tablet Take 81 mg by mouth every evening.     ? blood glucose meter kit and supplies KIT Dispense based on patient and insurance preference. Use up to four times daily as directed. (FOR ICD-9 250.00, 250.01). 1 each 0  ? cyanocobalamin 2000 MCG tablet Take 2,000 mcg by mouth 2 (two) times daily.     ? ferrous sulfate 325 (65 FE) MG tablet Take 325 mg by mouth 2 (two) times daily.    ? hydrochlorothiazide (HYDRODIURIL) 25 MG tablet Take 25 mg by mouth daily.    ? HYDROcodone-acetaminophen (NORCO/VICODIN) 5-325 MG tablet Take 1 tablet by mouth every 6 (six) hours as needed. 15 tablet 0  ? insulin aspart protamine- aspart (NOVOLOG MIX 70/30) (70-30) 100 UNIT/ML injection Inject 0.08 mLs (8 Units total) into the skin 2 (two) times daily with a meal. (Patient taking differently: Inject 16 Units into the skin 2 (two) times daily with a meal.) 10 mL 11  ? lidocaine (LIDODERM) 5 % Place 1 patch onto the skin daily. Remove & Discard patch within 12 hours or as directed by MD 30 patch 0  ? metoprolol succinate (TOPROL-XL) 25 MG 24 hr tablet Take 25 mg by mouth daily.    ? Multiple Vitamin (MULTIVITAMIN WITH MINERALS) TABS tablet Take 1 tablet by mouth daily.    ? naproxen (NAPROSYN) 500 MG tablet Take 1 tablet (500 mg total) by mouth 2 (two) times daily. 20 tablet 0  ? pravastatin (PRAVACHOL) 40 MG tablet Take 40 mg by mouth daily.     ? terbinafine (LAMISIL) 250 MG tablet Take 250 mg by mouth daily.    ? ?No current facility-administered medications for this visit.  ? ? ?ALLERGIES:  ?No Known Allergies ? ?PHYSICAL EXAM:  ?Performance status (ECOG): 1 - Symptomatic but completely ambulatory ? ?Vitals:  ? 09/23/21 0812  ?BP: (!) 160/73  ?Pulse: 76  ?Resp: 18  ?Temp: (!) 97 ?F (36.1 ?C)  ?SpO2: 100%  ? ?Wt Readings from Last 3 Encounters:  ?09/23/21 (!) 309 lb  (140.2 kg)  ?07/16/21 (!) 310 lb (140.6 kg)  ?03/18/21 (!) 310 lb 8 oz (140.8 kg)  ? ?Physical Exam ?Vitals reviewed.  ?Constitutional:   ?   Appearance: Normal appearance. He is obese.  ?Cardiovascular:  ?   Rate and Rhythm: Normal rate and regular rhythm.  ?   Pulses: Normal pulses.  ?   Heart sounds: Normal heart sounds.  ?Pulmonary:  ?   Effort: Pulmonary effort is normal.  ?   Breath sounds: Normal breath sounds.  ?Neurological:  ?   General: No focal deficit present.  ?   Mental Status: He is alert and oriented to person, place, and time.  ?Psychiatric:     ?   Mood and Affect: Mood  normal.     ?   Behavior: Behavior normal.  ?  ? ?LABORATORY DATA:  ?I have reviewed the labs as listed.  ? ?  Latest Ref Rng & Units 09/16/2021  ?  8:16 AM 03/07/2021  ? 11:08 AM 08/07/2020  ?  9:20 AM  ?CBC  ?WBC 4.0 - 10.5 K/uL 9.1   8.7   7.6    ?Hemoglobin 13.0 - 17.0 g/dL 12.2   11.4   12.1    ?Hematocrit 39.0 - 52.0 % 38.4   36.2   38.8    ?Platelets 150 - 400 K/uL 229   264   222    ? ? ?  Latest Ref Rng & Units 09/16/2021  ?  8:16 AM 03/07/2021  ? 11:08 AM 08/07/2020  ?  9:20 AM  ?CMP  ?Glucose 70 - 99 mg/dL 154   127   85    ?BUN 8 - 23 mg/dL 38   27   30    ?Creatinine 0.61 - 1.24 mg/dL 1.91   1.48   1.58    ?Sodium 135 - 145 mmol/L 136   138   136    ?Potassium 3.5 - 5.1 mmol/L 3.6   4.0   4.2    ?Chloride 98 - 111 mmol/L 103   108   102    ?CO2 22 - 32 mmol/L _0 ?Calcium 8.9 - 10.3 mg/dL 9.1   9.0   9.2    ?Total Protein 6.5 - 8.1 g/dL 7.9   7.7   7.7    ?Total Bilirubin 0.3 - 1.2 mg/dL 0.8   0.7   0.7    ?Alkaline Phos 38 - 126 U/L 69   72   63    ?AST 15 - 41 U/L _1 ?ALT 0 - 44 U/L _2 ? ? ?DIAGNOSTIC IMAGING:  ?I have independently reviewed the scans and discussed with the patient. ?CT Chest Wo Contrast ? ?Result Date: 09/16/2021 ?CLINICAL DATA:  Follow-up history of non-small cell lung cancer (right lung adenocarcinoma post lobectomy in 2020), monitor. * Tracking Code: BO * EXAM: CT  CHEST WITHOUT CONTRAST TECHNIQUE: Multidetector CT imaging of the chest was performed following the standard protocol without IV contrast. RADIATION DOSE REDUCTION: This exam was performed according to the depart

## 2022-03-25 ENCOUNTER — Inpatient Hospital Stay: Payer: PPO | Attending: Hematology

## 2022-03-25 ENCOUNTER — Ambulatory Visit (HOSPITAL_COMMUNITY): Admission: RE | Admit: 2022-03-25 | Payer: PPO | Source: Ambulatory Visit

## 2022-03-25 DIAGNOSIS — Z85118 Personal history of other malignant neoplasm of bronchus and lung: Secondary | ICD-10-CM | POA: Diagnosis not present

## 2022-03-25 DIAGNOSIS — D649 Anemia, unspecified: Secondary | ICD-10-CM | POA: Insufficient documentation

## 2022-03-25 DIAGNOSIS — I10 Essential (primary) hypertension: Secondary | ICD-10-CM | POA: Insufficient documentation

## 2022-03-25 DIAGNOSIS — K8689 Other specified diseases of pancreas: Secondary | ICD-10-CM | POA: Insufficient documentation

## 2022-03-25 DIAGNOSIS — C3491 Malignant neoplasm of unspecified part of right bronchus or lung: Secondary | ICD-10-CM

## 2022-03-25 DIAGNOSIS — E119 Type 2 diabetes mellitus without complications: Secondary | ICD-10-CM | POA: Diagnosis not present

## 2022-03-25 DIAGNOSIS — Z87891 Personal history of nicotine dependence: Secondary | ICD-10-CM | POA: Insufficient documentation

## 2022-03-25 DIAGNOSIS — D508 Other iron deficiency anemias: Secondary | ICD-10-CM

## 2022-03-25 LAB — CBC WITH DIFFERENTIAL/PLATELET
Abs Immature Granulocytes: 0.02 10*3/uL (ref 0.00–0.07)
Basophils Absolute: 0.1 10*3/uL (ref 0.0–0.1)
Basophils Relative: 1 %
Eosinophils Absolute: 0.2 10*3/uL (ref 0.0–0.5)
Eosinophils Relative: 2 %
HCT: 38.1 % — ABNORMAL LOW (ref 39.0–52.0)
Hemoglobin: 12.1 g/dL — ABNORMAL LOW (ref 13.0–17.0)
Immature Granulocytes: 0 %
Lymphocytes Relative: 34 %
Lymphs Abs: 2.9 10*3/uL (ref 0.7–4.0)
MCH: 28.9 pg (ref 26.0–34.0)
MCHC: 31.8 g/dL (ref 30.0–36.0)
MCV: 90.9 fL (ref 80.0–100.0)
Monocytes Absolute: 0.7 10*3/uL (ref 0.1–1.0)
Monocytes Relative: 8 %
Neutro Abs: 4.6 10*3/uL (ref 1.7–7.7)
Neutrophils Relative %: 55 %
Platelets: 229 10*3/uL (ref 150–400)
RBC: 4.19 MIL/uL — ABNORMAL LOW (ref 4.22–5.81)
RDW: 12.4 % (ref 11.5–15.5)
WBC: 8.3 10*3/uL (ref 4.0–10.5)
nRBC: 0 % (ref 0.0–0.2)

## 2022-03-25 LAB — COMPREHENSIVE METABOLIC PANEL
ALT: 31 U/L (ref 0–44)
AST: 30 U/L (ref 15–41)
Albumin: 3.9 g/dL (ref 3.5–5.0)
Alkaline Phosphatase: 68 U/L (ref 38–126)
Anion gap: 9 (ref 5–15)
BUN: 31 mg/dL — ABNORMAL HIGH (ref 8–23)
CO2: 22 mmol/L (ref 22–32)
Calcium: 8.8 mg/dL — ABNORMAL LOW (ref 8.9–10.3)
Chloride: 106 mmol/L (ref 98–111)
Creatinine, Ser: 1.66 mg/dL — ABNORMAL HIGH (ref 0.61–1.24)
GFR, Estimated: 44 mL/min — ABNORMAL LOW (ref >60)
Glucose, Bld: 145 mg/dL — ABNORMAL HIGH (ref 70–99)
Potassium: 4.1 mmol/L (ref 3.5–5.1)
Sodium: 137 mmol/L (ref 135–145)
Total Bilirubin: 1.1 mg/dL (ref 0.3–1.2)
Total Protein: 7.6 g/dL (ref 6.5–8.1)

## 2022-03-25 LAB — FERRITIN: Ferritin: 1032 ng/mL — ABNORMAL HIGH (ref 24–336)

## 2022-03-25 LAB — IRON AND TIBC
Iron: 70 ug/dL (ref 45–182)
Saturation Ratios: 25 % (ref 17.9–39.5)
TIBC: 279 ug/dL (ref 250–450)
UIBC: 209 ug/dL

## 2022-03-27 ENCOUNTER — Ambulatory Visit (HOSPITAL_COMMUNITY)
Admission: RE | Admit: 2022-03-27 | Discharge: 2022-03-27 | Disposition: A | Discharge status: Home / Self Care | Payer: PPO | Source: Ambulatory Visit | Attending: Hematology | Admitting: Hematology

## 2022-03-27 DIAGNOSIS — C3491 Malignant neoplasm of unspecified part of right bronchus or lung: Secondary | ICD-10-CM | POA: Insufficient documentation

## 2022-04-01 ENCOUNTER — Inpatient Hospital Stay (HOSPITAL_BASED_OUTPATIENT_CLINIC_OR_DEPARTMENT_OTHER): Payer: PPO | Admitting: Hematology

## 2022-04-01 VITALS — BP 162/76 | HR 98 | Temp 97.9°F | Resp 18 | Wt 322.4 lb

## 2022-04-01 DIAGNOSIS — K8689 Other specified diseases of pancreas: Secondary | ICD-10-CM

## 2022-04-01 DIAGNOSIS — C3491 Malignant neoplasm of unspecified part of right bronchus or lung: Secondary | ICD-10-CM

## 2022-04-01 DIAGNOSIS — D649 Anemia, unspecified: Secondary | ICD-10-CM | POA: Diagnosis not present

## 2022-04-01 NOTE — Patient Instructions (Signed)
[place] at [facility] Discharge Instructions   You were seen and examined today by Dr. Delton Coombes.  He reviewed the results of you lab work. Your iron is high. Cut back to taking iron 1 tablet 3 times a week.  He reviewed the results of you CT scan which was normal.   We will also obtain an MRI of your abdomen prior to next visit.   Return as scheduled in 1 year.    Thank you for choosing [place] at [facility] to provide your oncology and hematology care.  To afford each patient quality time with our provider, please arrive at least 15 minutes before your scheduled appointment time.   If you have a lab appointment with the [facility] please come in thru the Main Entrance and check in at the main information desk.  You need to re-schedule your appointment should you arrive 10 or more minutes late.  We strive to give you quality time with our providers, and arriving late affects you and other patients whose appointments are after yours.  Also, if you no show three or more times for appointments you may be dismissed from the clinic at the providers discretion.     Again, thank you for choosing [facility].  Our hope is that these requests will decrease the amount of time that you wait before being seen by our physicians.       _____________________________________________________________  Should you have questions after your visit to [facility], please contact our office at [phone] and follow the prompts.  Our office hours are 8:00 a.m. and 4:30 p.m. Monday - Friday.  Please note that voicemails left after 4:00 p.m. may not be returned until the following business day.  We are closed weekends and major holidays.  You do have access to a nurse 24-7, just call the main number to the clinic [phone] and do not press any options, hold on the line and a nurse will answer the phone.    For prescription  refill requests, have your pharmacy contact our office and allow 72 hours.    Due to Covid, you will need to wear a mask upon entering the hospital. If you do not have a mask, a mask will be given to you at the Main Entrance upon arrival. For doctor visits, patients may have 1 support person age 14 or older with them. For treatment visits, patients can not have anyone with them due to social distancing guidelines and our immunocompromised population.

## 2022-04-01 NOTE — Progress Notes (Signed)
[place], [place] [address]   CLINIC:  Medical Oncology/Hematology  PCP:  Gregory Evens, MD [place]. / [place] [address] [phone]   REASON FOR VISIT:  Follow-up for right lung adenocarcinoma  PRIOR THERAPY: Right lobectomy and lymph node biopsy on 03/07/2019  NGS Results: not done  CURRENT THERAPY: surevillance  BRIEF ONCOLOGIC HISTORY:  Oncology History  Adenocarcinoma of right lung, stage 1 ([place])  03/22/2019 Initial Diagnosis   Adenocarcinoma of right lung, stage 1 ([place])   03/22/2019 Cancer Staging   Staging form: Lung, AJCC 8th Edition - Clinical: cT1, cN0, cM0 - Signed by Gregory Jack, MD on 03/22/2019     CANCER STAGING:  Cancer Staging  Adenocarcinoma of right lung, stage 1 ([place]) Staging form: Lung, AJCC 8th Edition - Clinical: cT1, cN0, cM0 - Signed by Gregory Jack, MD on 03/22/2019   INTERVAL HISTORY:  [name], a 71 y.o. male, seen for follow-up of lung cancer.  Reports energy level 75%.  Denies any chest pains or shortness of breath.  REVIEW OF SYSTEMS:  Review of Systems  All other systems reviewed and are negative.   PAST MEDICAL/SURGICAL HISTORY:  Past Medical History:  Diagnosis Date   AKI (acute kidney injury) ([place]) 02/2019   Diabetes mellitus    Hypercholesterolemia    Hypertension    Pneumonia    Past Surgical History:  Procedure Laterality Date   CHOLECYSTECTOMY N/A 01/22/2016   Procedure: LAPAROSCOPIC CHOLECYSTECTOMY;  Surgeon: Gregory Signs, MD;  Location: [facility];  Service: General;  Laterality: N/A;   COLONOSCOPY  2007 [facility]   1 CM SIMPLE ADENOMA   COLONOSCOPY N/A 04/26/2015   Procedure: COLONOSCOPY;  Surgeon: Gregory Binder, MD;  Location: [facility];  Service: Endoscopy;  Laterality: N/A;  [place] (VATS)/ LOBECTOMY Right 03/07/2019   Procedure: VIDEO ASSISTED THORACOSCOPY (VATS)/RIGHT LOWER LOBECTOMY;  Surgeon: Gregory Matte, MD;  Location: [facility];  Service: Thoracic;  Laterality: Right;   VIDEO BRONCHOSCOPY N/A 03/07/2019   Procedure: VIDEO BRONCHOSCOPY;  Surgeon: Gregory Matte, MD;  Location: [facility];  Service: Thoracic;  Laterality: N/A;   VIDEO BRONCHOSCOPY WITH ENDOBRONCHIAL NAVIGATION N/A 02/22/2019   Procedure: VIDEO BRONCHOSCOPY WITH ENDOBRONCHIAL NAVIGATION WITH BIOPIES;  Surgeon: Gregory Matte, MD;  Location: [facility];  Service: Thoracic;  Laterality: N/A;    SOCIAL HISTORY:  Social History   Socioeconomic History   Marital status: Married    Spouse name: Not on file   Number of children: Not on file   Years of education: Not on file   Highest education level: Not on file  Occupational History   Occupation: retired  Tobacco Use   Smoking status: Former   Smokeless tobacco: Never  Scientific laboratory technician Use: Never used  Substance and Sexual Activity   Alcohol use: Yes    Alcohol/week: 0.0 standard drinks of alcohol    Comment: beer 2-3 times per week, 2 shots of scotch on saturday night , rarely since June   Drug use: No   Sexual activity: Not Currently  Other Topics Concern   Not on file  Social History Narrative   Not on file   Social Determinants of Health   Financial Resource Strain: Low Risk  (12/07/2018)   Overall Financial Resource Strain (CARDIA)    Difficulty of Paying Living Expenses: Not very hard  Food Insecurity: No Food Insecurity (12/07/2018)  Hunger Vital Sign    Worried About Running Out of Food in the Last Year: Never true    Ran Out of Food in the Last Year: Never true  Transportation Needs: No Transportation Needs (12/07/2018)   PRAPARE - Hydrologist (Medical): No    Lack of Transportation (Non-Medical): No  Physical Activity: Inactive (12/07/2018)   Exercise Vital Sign    Days of Exercise per Week: 0 days    Minutes of Exercise per Session: 0 min  Stress: No Stress Concern Present (12/07/2018)   [place]    Feeling of Stress : Not at all  Social Connections: Moderately Isolated (12/07/2018)   Social Connection and Isolation Panel [NHANES]    Frequency of Communication with Friends and Family: More than three times a week    Frequency of Social Gatherings with Friends and Family: More than three times a week    Attends Religious Services: Never    Marine scientist or Organizations: No    Attends Archivist Meetings: Never    Marital Status: Married  Human resources officer Violence: Not At Risk (12/07/2018)   Humiliation, Afraid, Rape, and Kick questionnaire    Fear of Current or Ex-Partner: No    Emotionally Abused: No    Physically Abused: No    Sexually Abused: No    FAMILY HISTORY:  Family History  Problem Relation Age of Onset   Cancer Paternal Grandfather    Colon cancer Neg Hx     CURRENT MEDICATIONS:  Current Outpatient Medications  Medication Sig Dispense Refill   amLODipine (NORVASC) 10 MG tablet Take 1 tablet (10 mg total) by mouth daily. 30 tablet 3   aspirin 81 MG tablet Take 81 mg by mouth every evening.      blood glucose meter kit and supplies KIT Dispense based on patient and insurance preference. Use up to four times daily as directed. (FOR ICD-9 250.00, 250.01). 1 each 0   cyanocobalamin 2000 MCG tablet Take 2,000 mcg by mouth 2 (two) times daily.      ferrous sulfate 325 (65 FE) MG tablet Take 325 mg by mouth 2 (two) times daily.     hydrochlorothiazide (HYDRODIURIL) 25 MG tablet Take 25 mg by mouth daily.     HYDROcodone-acetaminophen (NORCO/VICODIN) 5-325 MG tablet Take 1 tablet by mouth every 6 (six) hours as needed. 15 tablet 0   insulin aspart protamine- aspart (NOVOLOG MIX 70/30) (70-30) 100 UNIT/ML injection Inject 0.08 mLs (8 Units total) into the skin 2 (two) times daily with a meal. (Patient taking differently: Inject 16 Units into the skin 2 (two) times daily with a meal.) 10 mL 11    lidocaine (LIDODERM) 5 % Place 1 patch onto the skin daily. Remove & Discard patch within 12 hours or as directed by MD 30 patch 0   metoprolol succinate (TOPROL-XL) 25 MG 24 hr tablet Take 25 mg by mouth daily.     Multiple Vitamin (MULTIVITAMIN WITH MINERALS) TABS tablet Take 1 tablet by mouth daily.     naproxen (NAPROSYN) 500 MG tablet Take 1 tablet (500 mg total) by mouth 2 (two) times daily. 20 tablet 0   pravastatin (PRAVACHOL) 40 MG tablet Take 40 mg by mouth daily.      terbinafine (LAMISIL) 250 MG tablet Take 250 mg by mouth daily.     No current facility-administered medications for this visit.    ALLERGIES:  No Known Allergies  PHYSICAL EXAM:  Performance status (ECOG): 1 - Symptomatic but completely ambulatory  There were no vitals filed for this visit.  Wt Readings from Last 3 Encounters:  09/23/21 (!) 309 lb (140.2 kg)  07/16/21 (!) 310 lb (140.6 kg)  03/18/21 (!) 310 lb 8 oz (140.8 kg)   Physical Exam Vitals reviewed.  Constitutional:      Appearance: Normal appearance. He is obese.  Cardiovascular:     Rate and Rhythm: Normal rate and regular rhythm.     Pulses: Normal pulses.     Heart sounds: Normal heart sounds.  Pulmonary:     Effort: Pulmonary effort is normal.     Breath sounds: Normal breath sounds.  Neurological:     General: No focal deficit present.     Mental Status: He is alert and oriented to person, place, and time.  Psychiatric:        Mood and Affect: Mood normal.        Behavior: Behavior normal.     LABORATORY DATA:  I have reviewed the labs as listed.     Latest Ref Rng & Units 03/25/2022    9:00 AM 09/16/2021    8:16 AM 03/07/2021   11:08 AM  CBC  WBC 4.0 - 10.5 K/uL 8.3  9.1  8.7   Hemoglobin 13.0 - 17.0 g/dL 12.1  12.2  11.4   Hematocrit 39.0 - 52.0 % 38.1  38.4  36.2   Platelets 150 - 400 K/uL 229  229  264       Latest Ref Rng & Units 03/25/2022    9:00 AM 09/16/2021    8:16 AM 03/07/2021   11:08 AM  CMP  Glucose 70 - 99  mg/dL 145  154  127   BUN 8 - 23 mg/dL 31  38  27   Creatinine 0.61 - 1.24 mg/dL 1.66  1.91  1.48   Sodium 135 - 145 mmol/L 137  136  138   Potassium 3.5 - 5.1 mmol/L 4.1  3.6  4.0   Chloride 98 - 111 mmol/L 106  103  108   CO2 22 - 32 mmol/L 22  24  23    Calcium 8.9 - 10.3 mg/dL 8.8  9.1  9.0   Total Protein 6.5 - 8.1 g/dL 7.6  7.9  7.7   Total Bilirubin 0.3 - 1.2 mg/dL 1.1  0.8  0.7   Alkaline Phos 38 - 126 U/L 68  69  72   AST 15 - 41 U/L 30  19  21    ALT 0 - 44 U/L 31  22  25      DIAGNOSTIC IMAGING:  I have independently reviewed the scans and discussed with the patient. CT Chest Wo Contrast  Result Date: 03/29/2022 CLINICAL DATA:  Non-small-cell lung cancer. Restaging. * Tracking Code: BO * EXAM: CT CHEST WITHOUT CONTRAST TECHNIQUE: Multidetector CT imaging of the chest was performed following the standard protocol without IV contrast. RADIATION DOSE REDUCTION: This exam was performed according to the departmental dose-optimization program which includes automated exposure control, adjustment of the mA and/or kV according to patient size and/or use of iterative reconstruction technique. COMPARISON:  09/16/2021. FINDINGS: Cardiovascular: The heart size is normal. No substantial pericardial effusion. Coronary artery calcification is evident. Mild atherosclerotic calcification is noted in the wall of the thoracic aorta. Mediastinum/Nodes: No mediastinal lymphadenopathy. No evidence for gross hilar lymphadenopathy although assessment is limited by the lack of intravenous contrast on the current  study. The esophagus has normal imaging features. There is no axillary lymphadenopathy. Lungs/Pleura: Surgical changes in the right hilum are compatible with prior right lower lobectomy. Centrilobular emphsyema noted. Stable 2 mm nodule medial left apex (27/4). 3 mm right lung nodule on 62/4 is stable. 3 mm subpleural left upper lobe nodule on 43/4 is unchanged. Architectural distortion/scarring noted  posterior right lower lung. No new suspicious pulmonary nodule or mass. No focal airspace consolidation. No pleural effusion. Upper Abdomen: 2.2 cm subtle hypoattenuating lesion in the right liver (127/2) corresponds to the cyst characterized on MRI of 08/15/2020. Cystic lesion in the tail of pancreas seen on that MRI is not visible by CT today. Musculoskeletal: No worrisome lytic or sclerotic osseous abnormality. IMPRESSION: 1. Stable exam. No new or progressive findings. 2. Status post right lower lobectomy. 3. Stable tiny bilateral pulmonary nodules, most likely benign. Attention on follow-up recommended. 4.  Emphysema (ICD10-J43.9) and Aortic Atherosclerosis (ICD10-170.0) Electronically Signed   By: Misty Stanley M.D.   On: 03/29/2022 15:49     ASSESSMENT:  1.  Right lung adenocarcinoma (PT1CPN0): -Incidental right lung mass on a CT CAP done for abdominal pain. -Quit smoking 19 years ago, smoked 1/4 pack of cigarettes a day for 18 years followed by cigars and pipes for 10 years. - PET scan on 01/16/2019 shows 1.5 x 2.4 cm right lower lobe mass with SUV 3.2 with no adenopathy or metastatic disease. -MRI of the brain on 01/03/2019 was negative for metastatic disease. - He underwent right lobectomy and lymph node biopsy on 03/07/2019 which showed 2.3 cm adenocarcinoma, acinar predominant.  0/8 lymph nodes from level 7, 11, 9, 12 were negative.  Margins were negative.  No visceral pleural invasion.  No lymphovascular invasion. -CT chest on 01/23/2020 shows right lower lobectomy.  No evidence of recurrent or metastatic disease in the chest.   PLAN:  1.  Right lung adenocarcinoma (PT1CPN0): - No Lindsey or symptoms of recurrence. - CT chest without contrast on 03/27/2022 with no evidence of recurrence.  Stable tiny bilateral lung nodules. - We will switch his follow-up visits to once a year with CT chest without contrast.   2.  Cystic lesions in the tail of the pancreas: - Previous MRI of the abdomen on  08/15/2020 showed cystic lesions in the tail of the pancreas measuring up to 4 mm likely representing sidebranch intraductal papillary mucinous neoplasms. - Recommend repeating MRI of the abdomen with and without contrast prior to next visit.  3.  Mild normocytic anemia: - Likely etiology is CKD and relative iron deficiency.  Hemoglobin is 12.1. - He is taking iron tablet twice daily.  Ferritin is 1032. - I have told him to cut back iron to 1 tablet 3 times weekly.   Orders placed this encounter:  No orders of the defined types were placed in this encounter.    Gregory Jack, MD [place] [phone]   the above documentation for accuracy and completeness, and I agree with the above.

## 2022-04-08 ENCOUNTER — Other Ambulatory Visit: Payer: Self-pay | Admitting: *Deleted

## 2022-04-08 DIAGNOSIS — D508 Other iron deficiency anemias: Secondary | ICD-10-CM

## 2022-04-08 DIAGNOSIS — C3491 Malignant neoplasm of unspecified part of right bronchus or lung: Secondary | ICD-10-CM

## 2023-03-29 ENCOUNTER — Inpatient Hospital Stay: Payer: PPO | Attending: Hematology

## 2023-03-29 ENCOUNTER — Other Ambulatory Visit: Payer: Self-pay | Admitting: Hematology

## 2023-03-29 ENCOUNTER — Ambulatory Visit (HOSPITAL_COMMUNITY)
Admission: RE | Admit: 2023-03-29 | Discharge: 2023-03-29 | Disposition: A | Discharge status: Home / Self Care | Payer: PPO | Source: Ambulatory Visit | Attending: Hematology | Admitting: Hematology

## 2023-03-29 DIAGNOSIS — Z902 Acquired absence of lung [part of]: Secondary | ICD-10-CM | POA: Diagnosis not present

## 2023-03-29 DIAGNOSIS — Z87891 Personal history of nicotine dependence: Secondary | ICD-10-CM | POA: Diagnosis not present

## 2023-03-29 DIAGNOSIS — I2510 Atherosclerotic heart disease of native coronary artery without angina pectoris: Secondary | ICD-10-CM | POA: Insufficient documentation

## 2023-03-29 DIAGNOSIS — C3491 Malignant neoplasm of unspecified part of right bronchus or lung: Secondary | ICD-10-CM

## 2023-03-29 DIAGNOSIS — K862 Cyst of pancreas: Secondary | ICD-10-CM | POA: Insufficient documentation

## 2023-03-29 DIAGNOSIS — K8689 Other specified diseases of pancreas: Secondary | ICD-10-CM | POA: Insufficient documentation

## 2023-03-29 DIAGNOSIS — Z85118 Personal history of other malignant neoplasm of bronchus and lung: Secondary | ICD-10-CM | POA: Diagnosis present

## 2023-03-29 DIAGNOSIS — D508 Other iron deficiency anemias: Secondary | ICD-10-CM

## 2023-03-29 DIAGNOSIS — D649 Anemia, unspecified: Secondary | ICD-10-CM | POA: Diagnosis not present

## 2023-03-29 DIAGNOSIS — N281 Cyst of kidney, acquired: Secondary | ICD-10-CM | POA: Diagnosis not present

## 2023-03-29 DIAGNOSIS — I700 Atherosclerosis of aorta: Secondary | ICD-10-CM | POA: Insufficient documentation

## 2023-03-29 LAB — CBC WITH DIFFERENTIAL/PLATELET
Abs Immature Granulocytes: 0.03 10*3/uL (ref 0.00–0.07)
Basophils Absolute: 0.1 10*3/uL (ref 0.0–0.1)
Basophils Relative: 1 %
Eosinophils Absolute: 0.2 10*3/uL (ref 0.0–0.5)
Eosinophils Relative: 2 %
HCT: 35.2 % — ABNORMAL LOW (ref 39.0–52.0)
Hemoglobin: 11.3 g/dL — ABNORMAL LOW (ref 13.0–17.0)
Immature Granulocytes: 0 %
Lymphocytes Relative: 37 %
Lymphs Abs: 3.5 10*3/uL (ref 0.7–4.0)
MCH: 29.4 pg (ref 26.0–34.0)
MCHC: 32.1 g/dL (ref 30.0–36.0)
MCV: 91.7 fL (ref 80.0–100.0)
Monocytes Absolute: 0.9 10*3/uL (ref 0.1–1.0)
Monocytes Relative: 9 %
Neutro Abs: 4.9 10*3/uL (ref 1.7–7.7)
Neutrophils Relative %: 51 %
Platelets: 181 10*3/uL (ref 150–400)
RBC: 3.84 MIL/uL — ABNORMAL LOW (ref 4.22–5.81)
RDW: 12.4 % (ref 11.5–15.5)
WBC: 9.5 10*3/uL (ref 4.0–10.5)
nRBC: 0 % (ref 0.0–0.2)

## 2023-03-29 LAB — COMPREHENSIVE METABOLIC PANEL
ALT: 29 U/L (ref 0–44)
AST: 27 U/L (ref 15–41)
Albumin: 3.8 g/dL (ref 3.5–5.0)
Alkaline Phosphatase: 73 U/L (ref 38–126)
Anion gap: 8 (ref 5–15)
BUN: 23 mg/dL (ref 8–23)
CO2: 21 mmol/L — ABNORMAL LOW (ref 22–32)
Calcium: 8.5 mg/dL — ABNORMAL LOW (ref 8.9–10.3)
Chloride: 105 mmol/L (ref 98–111)
Creatinine, Ser: 1.5 mg/dL — ABNORMAL HIGH (ref 0.61–1.24)
GFR, Estimated: 50 mL/min — ABNORMAL LOW (ref >60)
Glucose, Bld: 129 mg/dL — ABNORMAL HIGH (ref 70–99)
Potassium: 3.6 mmol/L (ref 3.5–5.1)
Sodium: 134 mmol/L — ABNORMAL LOW (ref 135–145)
Total Bilirubin: 0.6 mg/dL (ref 0.3–1.2)
Total Protein: 7.2 g/dL (ref 6.5–8.1)

## 2023-03-29 LAB — FERRITIN: Ferritin: 1042 ng/mL — ABNORMAL HIGH (ref 24–336)

## 2023-03-29 LAB — IRON AND TIBC
Iron: 94 ug/dL (ref 45–182)
Saturation Ratios: 35 % (ref 17.9–39.5)
TIBC: 267 ug/dL (ref 250–450)
UIBC: 173 ug/dL

## 2023-03-29 MED ORDER — GADOBUTROL 1 MMOL/ML IV SOLN
10.0000 mL | Freq: Once | INTRAVENOUS | Status: AC | PRN
  Administered 2023-03-29: 10 mL via INTRAVENOUS

## 2023-04-04 NOTE — Progress Notes (Signed)
[facility] [address], [place] [address]    Clinic Day:  04/05/2023  Referring physician: Gareth Morgan, MD  Patient Care Team: Gareth Morgan, MD as PCP - General (Family Medicine) West Bali, MD (Inactive) as Consulting Physician (Gastroenterology) Lanelle Bal, DO as Consulting Physician (Internal Medicine)   ASSESSMENT & PLAN:   ASSESSMENT:  1.  Right lung adenocarcinoma (PT1CPN0): -Incidental right lung mass on a CT CAP done for abdominal pain. -Quit smoking 19 years ago, smoked 1/4 pack of cigarettes a day for 18 years followed by cigars and pipes for 10 years. - PET scan on 01/16/2019 shows 1.5 x 2.4 cm right lower lobe mass with SUV 3.2 with no adenopathy or metastatic disease. -MRI of the brain on 01/03/2019 was negative for metastatic disease. - He underwent right lobectomy and lymph node biopsy on 03/07/2019 which showed 2.3 cm adenocarcinoma, acinar predominant.  0/8 lymph nodes from level 7, 11, 9, 12 were negative.  Margins were negative.  No visceral pleural invasion.  No lymphovascular invasion. -CT chest on 01/23/2020 shows right lower lobectomy.  No evidence of recurrent or metastatic disease in the chest.     PLAN:  1.  Right lung adenocarcinoma (PT1CPN0): - Denies any cough or hemoptysis.  No chest pains. - Reviewed CT chest from 03/29/2023: No evidence of local recurrence.  No metastatic disease. - Recommend RTC in 1 year with repeat CT of the chest.   2.  Cystic lesions in the tail of the pancreas: - We reviewed results of MRI of the abdomen from 03/29/2023: Tiny cystic pancreatic lesions unchanged from MRI 1 year ago.  Differential considerations include indolent cystic neoplasm or pseudocyst. - Will repeat MRI of the abdomen with and without contrast in 2 years.  Will  3.  Mild normocytic anemia: - Likely etiology CKD and functional iron deficiency.  He is taking iron tablet twice weekly. - Ferritin is 1042, percent  saturation 35 and hemoglobin 11.3.  Orders Placed This Encounter  Procedures   CT Chest W Contrast    Standing Status:   Future    Standing Expiration Date:   04/04/2024    Order Specific Question:   If indicated for the ordered procedure, I authorize the administration of contrast media per Radiology protocol    Answer:   Yes    Order Specific Question:   Does the patient have a contrast media/X-ray dye allergy?    Answer:   No    Order Specific Question:   Preferred imaging location?    Answer:   [facility]   CBC with Differential    Standing Status:   Future    Standing Expiration Date:   04/04/2024   Comprehensive metabolic panel    Standing Status:   Future    Standing Expiration Date:   04/04/2024   Iron and TIBC ([facility] ONLY)    Standing Status:   Future    Standing Expiration Date:   04/04/2024   Ferritin    Standing Status:   Future    Standing Expiration Date:   04/04/2024      I,Katie Daubenspeck,acting as a scribe for Doreatha Massed, MD.,have documented all relevant documentation on the behalf of Doreatha Massed, MD,as directed by  Doreatha Massed, MD while in the presence of Doreatha Massed, MD.   I, Doreatha Massed MD, have reviewed the above documentation for accuracy and completeness, and I agree with the above.   Doreatha Massed,  MD   10/21/20243:59 PM  CHIEF COMPLAINT:   Diagnosis: right lung adenocarcinoma    Cancer Staging  Adenocarcinoma of right lung, stage 1 (HCC) Staging form: Lung, AJCC 8th Edition - Clinical: cT1, cN0, cM0 - Signed by Doreatha Massed, MD on 03/22/2019    Prior Therapy: Right lobectomy and lymph node biopsy on 03/07/2019   Current Therapy:  surveillance   HISTORY OF PRESENT ILLNESS:   Oncology History  Adenocarcinoma of right lung, stage 1 (HCC)  03/22/2019 Initial Diagnosis   Adenocarcinoma of right lung, stage 1 (HCC)   03/22/2019 Cancer Staging   Staging  form: Lung, AJCC 8th Edition - Clinical: cT1, cN0, cM0 - Signed by Doreatha Massed, MD on 03/22/2019      INTERVAL HISTORY:   Gregory Lindsey is a 72 y.o. male presenting to clinic today for follow up of right lung adenocarcinoma. He was last seen by me on 04/01/22.  Since his last visit, he underwent restaging chest CT on 03/29/23.   He also underwent abdomen MRI the same day.   Today, he states that he is doing well overall. His appetite level is at 100%. His energy level is at 70%.  PAST MEDICAL HISTORY:   Past Medical History: Past Medical History:  Diagnosis Date   AKI (acute kidney injury) (HCC) 02/2019   Diabetes mellitus    Hypercholesterolemia    Hypertension    Pneumonia     Surgical History: Past Surgical History:  Procedure Laterality Date   CHOLECYSTECTOMY N/A 01/22/2016   Procedure: LAPAROSCOPIC CHOLECYSTECTOMY;  Surgeon: Franky Macho, MD;  Location: [facility];  Service: General;  Laterality: N/A;   COLONOSCOPY  2007 [facility]   1 CM SIMPLE ADENOMA   COLONOSCOPY N/A 04/26/2015   Procedure: COLONOSCOPY;  Surgeon: West Bali, MD;  Location: [facility];  Service: Endoscopy;  Laterality: N/A;  930    VIDEO ASSISTED THORACOSCOPY (VATS)/ LOBECTOMY Right 03/07/2019   Procedure: VIDEO ASSISTED THORACOSCOPY (VATS)/RIGHT LOWER LOBECTOMY;  Surgeon: Corliss Skains, MD;  Location: [facility] OR;  Service: Thoracic;  Laterality: Right;   VIDEO BRONCHOSCOPY N/A 03/07/2019   Procedure: VIDEO BRONCHOSCOPY;  Surgeon: Corliss Skains, MD;  Location: [facility] OR;  Service: Thoracic;  Laterality: N/A;   VIDEO BRONCHOSCOPY WITH ENDOBRONCHIAL NAVIGATION N/A 02/22/2019   Procedure: VIDEO BRONCHOSCOPY WITH ENDOBRONCHIAL NAVIGATION WITH BIOPIES;  Surgeon: Corliss Skains, MD;  Location: [facility] OR;  Service: Thoracic;  Laterality: N/A;    Social History: Social History   Socioeconomic History   Marital status: Married    Spouse name: Not on file   Number of children: Not on file   Years of  education: Not on file   Highest education level: Not on file  Occupational History   Occupation: retired  Tobacco Use   Smoking status: Former   Smokeless tobacco: Never  Vaping Use   Vaping status: Never Used  Substance and Sexual Activity   Alcohol use: Yes    Alcohol/week: 0.0 standard drinks of alcohol    Comment: beer 2-3 times per week, 2 shots of scotch on saturday night , rarely since June   Drug use: No   Sexual activity: Not Currently  Other Topics Concern   Not on file  Social History Narrative   Not on file   Social Determinants of Health   Financial Resource Strain: Low Risk  (12/07/2018)   Overall Financial Resource Strain (CARDIA)    Difficulty of Paying Living Expenses: Not very hard  Food Insecurity: No Food  Insecurity (12/07/2018)   Hunger Vital Sign    Worried About Running Out of Food in the Last Year: Never true    Ran Out of Food in the Last Year: Never true  Transportation Needs: No Transportation Needs (12/07/2018)   PRAPARE - Administrator, Civil Service (Medical): No    Lack of Transportation (Non-Medical): No  Physical Activity: Inactive (12/07/2018)   Exercise Vital Sign    Days of Exercise per Week: 0 days    Minutes of Exercise per Session: 0 min  Stress: No Stress Concern Present (12/07/2018)   [employer] of Occupational Health - Occupational Stress Questionnaire    Feeling of Stress : Not at all  Social Connections: Moderately Isolated (12/07/2018)   Social Connection and Isolation Panel [NHANES]    Frequency of Communication with Friends and Family: More than three times a week    Frequency of Social Gatherings with Friends and Family: More than three times a week    Attends Religious Services: Never    Database administrator or Organizations: No    Attends Banker Meetings: Never    Marital Status: Married  Catering manager Violence: Not At Risk (12/07/2018)   Humiliation, Afraid, Rape, and Kick questionnaire     Fear of Current or Ex-Partner: No    Emotionally Abused: No    Physically Abused: No    Sexually Abused: No    Family History: Family History  Problem Relation Age of Onset   Cancer Paternal Grandfather    Colon cancer Neg Hx     Current Medications:  Current Outpatient Medications:    amLODipine (NORVASC) 10 MG tablet, Take 1 tablet (10 mg total) by mouth daily., Disp: 30 tablet, Rfl: 3   aspirin 81 MG tablet, Take 81 mg by mouth every evening. , Disp: , Rfl:    blood glucose meter kit and supplies KIT, Dispense based on patient and insurance preference. Use up to four times daily as directed. (FOR ICD-9 250.00, 250.01)., Disp: 1 each, Rfl: 0   cyanocobalamin 2000 MCG tablet, Take 2,000 mcg by mouth 2 (two) times daily. , Disp: , Rfl:    ferrous sulfate 325 (65 FE) MG tablet, Take 325 mg by mouth 2 (two) times daily., Disp: , Rfl:    hydrochlorothiazide (HYDRODIURIL) 25 MG tablet, Take 25 mg by mouth daily., Disp: , Rfl:    HYDROcodone-acetaminophen (NORCO/VICODIN) 5-325 MG tablet, Take 1 tablet by mouth every 6 (six) hours as needed., Disp: 15 tablet, Rfl: 0   insulin aspart protamine- aspart (NOVOLOG MIX 70/30) (70-30) 100 UNIT/ML injection, Inject 0.08 mLs (8 Units total) into the skin 2 (two) times daily with a meal. (Patient taking differently: Inject 16 Units into the skin 2 (two) times daily with a meal.), Disp: 10 mL, Rfl: 11   lidocaine (LIDODERM) 5 %, Place 1 patch onto the skin daily. Remove & Discard patch within 12 hours or as directed by MD, Disp: 30 patch, Rfl: 0   metoprolol succinate (TOPROL-XL) 100 MG 24 hr tablet, Take 100 mg by mouth daily., Disp: , Rfl:    Multiple Vitamin (MULTIVITAMIN WITH MINERALS) TABS tablet, Take 1 tablet by mouth daily., Disp: , Rfl:    naproxen (NAPROSYN) 500 MG tablet, Take 1 tablet (500 mg total) by mouth 2 (two) times daily., Disp: 20 tablet, Rfl: 0   pravastatin (PRAVACHOL) 40 MG tablet, Take 40 mg by mouth daily. , Disp: , Rfl:     terbinafine (LAMISIL)  250 MG tablet, Take 250 mg by mouth daily., Disp: , Rfl:    Allergies: No Known Allergies  REVIEW OF SYSTEMS:   Review of Systems  Constitutional:  Negative for chills, fatigue and fever.  HENT:   Negative for lump/mass, mouth sores, nosebleeds, sore throat and trouble swallowing.   Eyes:  Negative for eye problems.  Respiratory:  Negative for cough and shortness of breath.   Cardiovascular:  Negative for chest pain, leg swelling and palpitations.  Gastrointestinal:  Positive for constipation and diarrhea. Negative for abdominal pain, nausea and vomiting.  Genitourinary:  Negative for bladder incontinence, difficulty urinating, dysuria, frequency, hematuria and nocturia.   Musculoskeletal:  Positive for arthralgias. Negative for back pain, flank pain, myalgias and neck pain.  Skin:  Negative for itching and rash.  Neurological:  Negative for dizziness, headaches and numbness.  Hematological:  Does not bruise/bleed easily.  Psychiatric/Behavioral:  Positive for sleep disturbance. Negative for depression and suicidal ideas. The patient is not nervous/anxious.   All other systems reviewed and are negative.    VITALS:   Blood pressure 138/73, pulse 76, temperature 98.7 F (37.1 C), temperature source Oral, resp. rate 17, height 5\' 10"  (1.778 m), weight (!) 339 lb (153.8 kg), SpO2 98%.  Wt Readings from Last 3 Encounters:  04/05/23 (!) 339 lb (153.8 kg)  04/01/22 (!) 322 lb 6.4 oz (146.2 kg)  09/23/21 (!) 309 lb (140.2 kg)    Body mass index is 48.64 kg/m.  Performance status (ECOG): 1 - Symptomatic but completely ambulatory  PHYSICAL EXAM:   Physical Exam Vitals and nursing note reviewed. Exam conducted with a chaperone present.  Constitutional:      Appearance: Normal appearance.  Cardiovascular:     Rate and Rhythm: Normal rate and regular rhythm.     Pulses: Normal pulses.     Heart sounds: Normal heart sounds.  Pulmonary:     Effort: Pulmonary  effort is normal.     Breath sounds: Normal breath sounds.  Abdominal:     Palpations: Abdomen is soft. There is no hepatomegaly, splenomegaly or mass.     Tenderness: There is no abdominal tenderness.  Musculoskeletal:     Right lower leg: No edema.     Left lower leg: No edema.  Lymphadenopathy:     Cervical: No cervical adenopathy.     Right cervical: No superficial, deep or posterior cervical adenopathy.    Left cervical: No superficial, deep or posterior cervical adenopathy.     Upper Body:     Right upper body: No supraclavicular or axillary adenopathy.     Left upper body: No supraclavicular or axillary adenopathy.  Neurological:     General: No focal deficit present.     Mental Status: He is alert and oriented to person, place, and time.  Psychiatric:        Mood and Affect: Mood normal.        Behavior: Behavior normal.     LABS:      Latest Ref Rng & Units 03/29/2023    8:51 AM 03/25/2022    9:00 AM 09/16/2021    8:16 AM  CBC  WBC 4.0 - 10.5 K/uL 9.5  8.3  9.1   Hemoglobin 13.0 - 17.0 g/dL 16.1  09.6  04.5   Hematocrit 39.0 - 52.0 % 35.2  38.1  38.4   Platelets 150 - 400 K/uL 181  229  229       Latest Ref Rng & Units 03/29/2023  8:51 AM 03/25/2022    9:00 AM 09/16/2021    8:16 AM  CMP  Glucose 70 - 99 mg/dL 956  387  564   BUN 8 - 23 mg/dL 23  31  38   Creatinine 0.61 - 1.24 mg/dL 3.32  9.51  8.84   Sodium 135 - 145 mmol/L 134  137  136   Potassium 3.5 - 5.1 mmol/L 3.6  4.1  3.6   Chloride 98 - 111 mmol/L 105  106  103   CO2 22 - 32 mmol/L 21  22  24    Calcium 8.9 - 10.3 mg/dL 8.5  8.8  9.1   Total Protein 6.5 - 8.1 g/dL 7.2  7.6  7.9   Total Bilirubin 0.3 - 1.2 mg/dL 0.6  1.1  0.8   Alkaline Phos 38 - 126 U/L 73  68  69   AST 15 - 41 U/L 27  30  19    ALT 0 - 44 U/L 29  31  22       No results found for: "CEA1", "CEA" / No results found for: "CEA1", "CEA" No results found for: "PSA1" No results found for: "ZYS063" No results found for: "CAN125"   No results found for: "TOTALPROTELP", "ALBUMINELP", "A1GS", "A2GS", "BETS", "BETA2SER", "GAMS", "MSPIKE", "SPEI" Lab Results  Component Value Date   TIBC 267 03/29/2023   TIBC 279 03/25/2022   TIBC 272 09/16/2021   FERRITIN 1,042 (H) 03/29/2023   FERRITIN 1,032 (H) 03/25/2022   FERRITIN 967 (H) 09/16/2021   IRONPCTSAT 35 03/29/2023   IRONPCTSAT 25 03/25/2022   IRONPCTSAT 48 (H) 09/16/2021   Lab Results  Component Value Date   LDH 139 07/19/2019     STUDIES:   CT Chest Wo Contrast  Result Date: 04/05/2023 CLINICAL DATA:  Right lung adenocarcinoma. Testicular cancer. Restaging. * Tracking Code: BO * EXAM: CT CHEST WITHOUT CONTRAST TECHNIQUE: Multidetector CT imaging of the chest was performed following the standard protocol without IV contrast. RADIATION DOSE REDUCTION: This exam was performed according to the departmental dose-optimization program which includes automated exposure control, adjustment of the mA and/or kV according to patient size and/or use of iterative reconstruction technique. COMPARISON:  03/27/2022 chest CT. FINDINGS: Cardiovascular: Normal heart size. No significant pericardial effusion/thickening. Left anterior descending and left circumflex coronary atherosclerosis. Atherosclerotic nonaneurysmal thoracic aorta. Normal caliber pulmonary arteries. Mediastinum/Nodes: No significant thyroid nodules. Unremarkable esophagus. No pathologically enlarged axillary, mediastinal or hilar lymph nodes, noting limited sensitivity for the detection of hilar adenopathy on this noncontrast study. Lungs/Pleura: No pneumothorax. No pleural effusion. Status post right lower lobectomy. Calcified 2 mm medial apical left upper lobe nodule is stable. Solid 0.3 cm posterior left lower lobe nodule (series 3/image 77), stable. No acute consolidative airspace disease, lung masses or new significant pulmonary nodules. Mild centrilobular emphysema. Upper abdomen: Cholecystectomy. Hypodense 2.1 cm  right liver lesion (series 2/image 127), stable and characterized as a benign hemangioma on 03/29/2023 MRI abdomen. Musculoskeletal: No aggressive appearing focal osseous lesions. Moderate thoracic spondylosis. IMPRESSION: 1. Stable chest CT. No evidence of local tumor recurrence status post right lower lobectomy. 2. No evidence of metastatic disease in the chest. 3. Two-vessel coronary atherosclerosis. 4. Aortic Atherosclerosis (ICD10-I70.0) and Emphysema (ICD10-J43.9). Electronically Signed   By: Delbert Phenix M.D.   On: 04/05/2023 09:11   MR Abdomen W Wo Contrast  Result Date: 04/05/2023 CLINICAL DATA:  Pancreatic mass.  Monitor.  History of lung cancer. EXAM: MRI ABDOMEN WITHOUT AND WITH CONTRAST TECHNIQUE: Multiplanar multisequence MR  imaging of the abdomen was performed both before and after the administration of intravenous contrast. CONTRAST:  10mL GADAVIST GADOBUTROL 1 MMOL/ML IV SOLN COMPARISON:  08/15/2020 abdominal MR. FINDINGS: Lower chest: Normal heart size without pericardial or pleural effusion. Hepatobiliary: Segment 7/8 2.4 cm cavernous hemangioma again identified. No suspicious liver lesion. Cholecystectomy, without biliary duct dilatation or choledocholithiasis. Pancreas: Again identified are multiple tiny cystic lesions throughout the pancreas. The largest is within the tail/body junction at 3 mm on 24/15. 2 mm in the pancreatic neck on 26/15 and 3 mm in the pancreatic uncinate process on 31/15. These demonstrate no suspicious postcontrast characteristics. No main duct dilatation or acute inflammation. Spleen:  Normal in size, without focal abnormality. Adrenals/Urinary Tract: Normal adrenal glands. Upper/interpolar 6 mm right renal cyst. Normal left kidney. No hydronephrosis. Stomach/Bowel: Normal stomach and abdominal bowel loops. Vascular/Lymphatic: Aortic atherosclerosis. No retroperitoneal or retrocrural adenopathy. Other:  No ascites. Musculoskeletal: No acute osseous abnormality.  IMPRESSION: 1. Tiny cystic pancreatic lesions are unchanged with differential considerations of indolent cystic neoplasm or pseudocyst. Per consensus criteria, follow-up with pre and post contrast abdominal MRI at 2 years is recommended. This recommendation follows ACR consensus guidelines: Management of Incidental Pancreatic Cysts: A White Paper of the ACR Incidental Findings Committee. J Am Coll Radiol 2017;14:911-923. 2.  No acute abdominal process. 3.  Aortic Atherosclerosis (ICD10-I70.0). Electronically Signed   By: Jeronimo Greaves M.D.   On: 04/05/2023 08:31   MR 3D Recon At Scanner  Result Date: 04/05/2023 CLINICAL DATA:  Pancreatic mass.  Monitor.  History of lung cancer. EXAM: MRI ABDOMEN WITHOUT AND WITH CONTRAST TECHNIQUE: Multiplanar multisequence MR imaging of the abdomen was performed both before and after the administration of intravenous contrast. CONTRAST:  10mL GADAVIST GADOBUTROL 1 MMOL/ML IV SOLN COMPARISON:  08/15/2020 abdominal MR. FINDINGS: Lower chest: Normal heart size without pericardial or pleural effusion. Hepatobiliary: Segment 7/8 2.4 cm cavernous hemangioma again identified. No suspicious liver lesion. Cholecystectomy, without biliary duct dilatation or choledocholithiasis. Pancreas: Again identified are multiple tiny cystic lesions throughout the pancreas. The largest is within the tail/body junction at 3 mm on 24/15. 2 mm in the pancreatic neck on 26/15 and 3 mm in the pancreatic uncinate process on 31/15. These demonstrate no suspicious postcontrast characteristics. No main duct dilatation or acute inflammation. Spleen:  Normal in size, without focal abnormality. Adrenals/Urinary Tract: Normal adrenal glands. Upper/interpolar 6 mm right renal cyst. Normal left kidney. No hydronephrosis. Stomach/Bowel: Normal stomach and abdominal bowel loops. Vascular/Lymphatic: Aortic atherosclerosis. No retroperitoneal or retrocrural adenopathy. Other:  No ascites. Musculoskeletal: No acute  osseous abnormality. IMPRESSION: 1. Tiny cystic pancreatic lesions are unchanged with differential considerations of indolent cystic neoplasm or pseudocyst. Per consensus criteria, follow-up with pre and post contrast abdominal MRI at 2 years is recommended. This recommendation follows ACR consensus guidelines: Management of Incidental Pancreatic Cysts: A White Paper of the ACR Incidental Findings Committee. J Am Coll Radiol 2017;14:911-923. 2.  No acute abdominal process. 3.  Aortic Atherosclerosis (ICD10-I70.0). Electronically Signed   By: Jeronimo Greaves M.D.   On: 04/05/2023 08:31

## 2023-04-05 ENCOUNTER — Inpatient Hospital Stay (HOSPITAL_BASED_OUTPATIENT_CLINIC_OR_DEPARTMENT_OTHER): Payer: PPO | Admitting: Hematology

## 2023-04-05 VITALS — BP 138/73 | HR 76 | Temp 98.7°F | Resp 17 | Ht 70.0 in | Wt 339.0 lb

## 2023-04-05 DIAGNOSIS — D508 Other iron deficiency anemias: Secondary | ICD-10-CM | POA: Diagnosis not present

## 2023-04-05 DIAGNOSIS — C3491 Malignant neoplasm of unspecified part of right bronchus or lung: Secondary | ICD-10-CM | POA: Diagnosis not present

## 2023-04-05 DIAGNOSIS — Z85118 Personal history of other malignant neoplasm of bronchus and lung: Secondary | ICD-10-CM | POA: Diagnosis not present

## 2023-04-05 NOTE — Patient Instructions (Signed)
[facility] at [facility] Discharge Instructions   You were seen and examined today by Dr. Ellin Saba.  He reviewed the results of your lab work which are normal/stable.   We are awaiting the results of the MRI of your abdomen and the CT scan of your chest. We will contact you with those results later today.  We will see you back in 1 year. We will repeat lab work and scans prior to this visit.   Return as scheduled.    Thank you for choosing [facility] at [facility] to provide your oncology and hematology care.  To afford each patient quality time with our provider, please arrive at least 15 minutes before your scheduled appointment time.   If you have a lab appointment with the Cancer Center please come in thru the Main Entrance and check in at the main information desk.  You need to re-schedule your appointment should you arrive 10 or more minutes late.  We strive to give you quality time with our providers, and arriving late affects you and other patients whose appointments are after yours.  Also, if you no show three or more times for appointments you may be dismissed from the clinic at the providers discretion.     Again, thank you for choosing [facility].  Our hope is that these requests will decrease the amount of time that you wait before being seen by our physicians.       _____________________________________________________________  Should you have questions after your visit to [facility], please contact our office at [phone] and follow the prompts.  Our office hours are 8:00 a.m. and 4:30 p.m. Monday - Friday.  Please note that voicemails left after 4:00 p.m. may not be returned until the following business day.  We are closed weekends and major holidays.  You do have access to a nurse 24-7, just call the main number to the clinic [phone] and do not press any options, hold on the line and a  nurse will answer the phone.    For prescription refill requests, have your pharmacy contact our office and allow 72 hours.    Due to Covid, you will need to wear a mask upon entering the hospital. If you do not have a mask, a mask will be given to you at the Main Entrance upon arrival. For doctor visits, patients may have 1 support person age 70 or older with them. For treatment visits, patients can not have anyone with them due to social distancing guidelines and our immunocompromised population.

## 2024-03-27 ENCOUNTER — Inpatient Hospital Stay: Payer: PPO | Attending: Physician Assistant

## 2024-03-27 ENCOUNTER — Ambulatory Visit (HOSPITAL_COMMUNITY)
Admission: RE | Admit: 2024-03-27 | Discharge: 2024-03-27 | Disposition: A | Discharge status: Home / Self Care | Payer: PPO | Source: Ambulatory Visit | Attending: Hematology | Admitting: Hematology

## 2024-03-27 DIAGNOSIS — D631 Anemia in chronic kidney disease: Secondary | ICD-10-CM | POA: Insufficient documentation

## 2024-03-27 DIAGNOSIS — R7989 Other specified abnormal findings of blood chemistry: Secondary | ICD-10-CM | POA: Insufficient documentation

## 2024-03-27 DIAGNOSIS — D508 Other iron deficiency anemias: Secondary | ICD-10-CM

## 2024-03-27 DIAGNOSIS — N1832 Chronic kidney disease, stage 3b: Secondary | ICD-10-CM | POA: Diagnosis not present

## 2024-03-27 DIAGNOSIS — K8689 Other specified diseases of pancreas: Secondary | ICD-10-CM | POA: Insufficient documentation

## 2024-03-27 DIAGNOSIS — I700 Atherosclerosis of aorta: Secondary | ICD-10-CM | POA: Insufficient documentation

## 2024-03-27 DIAGNOSIS — K7689 Other specified diseases of liver: Secondary | ICD-10-CM | POA: Insufficient documentation

## 2024-03-27 DIAGNOSIS — C3491 Malignant neoplasm of unspecified part of right bronchus or lung: Secondary | ICD-10-CM | POA: Diagnosis present

## 2024-03-27 DIAGNOSIS — Z87891 Personal history of nicotine dependence: Secondary | ICD-10-CM | POA: Insufficient documentation

## 2024-03-27 DIAGNOSIS — Z9049 Acquired absence of other specified parts of digestive tract: Secondary | ICD-10-CM | POA: Insufficient documentation

## 2024-03-27 LAB — CBC WITH DIFFERENTIAL/PLATELET
Abs Immature Granulocytes: 0.02 K/uL (ref 0.00–0.07)
Basophils Absolute: 0 K/uL (ref 0.0–0.1)
Basophils Relative: 0 %
Eosinophils Absolute: 0.1 K/uL (ref 0.0–0.5)
Eosinophils Relative: 1 %
HCT: 38.3 % — ABNORMAL LOW (ref 39.0–52.0)
Hemoglobin: 12.2 g/dL — ABNORMAL LOW (ref 13.0–17.0)
Immature Granulocytes: 0 %
Lymphocytes Relative: 35 %
Lymphs Abs: 2.5 K/uL (ref 0.7–4.0)
MCH: 29.3 pg (ref 26.0–34.0)
MCHC: 31.9 g/dL (ref 30.0–36.0)
MCV: 91.8 fL (ref 80.0–100.0)
Monocytes Absolute: 0.7 K/uL (ref 0.1–1.0)
Monocytes Relative: 9 %
Neutro Abs: 3.9 K/uL (ref 1.7–7.7)
Neutrophils Relative %: 55 %
Platelets: 194 K/uL (ref 150–400)
RBC: 4.17 MIL/uL — ABNORMAL LOW (ref 4.22–5.81)
RDW: 12.4 % (ref 11.5–15.5)
WBC: 7.3 K/uL (ref 4.0–10.5)
nRBC: 0 % (ref 0.0–0.2)

## 2024-03-27 LAB — IRON AND TIBC
Iron: 84 ug/dL (ref 45–182)
Saturation Ratios: 33 % (ref 17.9–39.5)
TIBC: 258 ug/dL (ref 250–450)
UIBC: 173 ug/dL

## 2024-03-27 LAB — COMPREHENSIVE METABOLIC PANEL WITH GFR
ALT: 30 U/L (ref 0–44)
AST: 22 U/L (ref 15–41)
Albumin: 4.1 g/dL (ref 3.5–5.0)
Alkaline Phosphatase: 99 U/L (ref 38–126)
Anion gap: 11 (ref 5–15)
BUN: 23 mg/dL (ref 8–23)
CO2: 23 mmol/L (ref 22–32)
Calcium: 9.3 mg/dL (ref 8.9–10.3)
Chloride: 103 mmol/L (ref 98–111)
Creatinine, Ser: 1.72 mg/dL — ABNORMAL HIGH (ref 0.61–1.24)
GFR, Estimated: 42 mL/min — ABNORMAL LOW (ref >60)
Glucose, Bld: 294 mg/dL — ABNORMAL HIGH (ref 70–99)
Potassium: 4.3 mmol/L (ref 3.5–5.1)
Sodium: 137 mmol/L (ref 135–145)
Total Bilirubin: 0.4 mg/dL (ref 0.0–1.2)
Total Protein: 7.4 g/dL (ref 6.5–8.1)

## 2024-03-27 LAB — FERRITIN: Ferritin: 1677 ng/mL — ABNORMAL HIGH (ref 24–336)

## 2024-03-27 MED ORDER — IOHEXOL 300 MG/ML  SOLN
60.0000 mL | Freq: Once | INTRAMUSCULAR | Status: AC | PRN
  Administered 2024-03-27: 60 mL via INTRAVENOUS

## 2024-04-02 NOTE — Progress Notes (Unsigned)
 [facility] [address], [place] [address]   CLINIC:  Medical Oncology/Hematology  PCP:  Nemiah Kemps, MD [address] [place] / [place] [address] [phone]   REASON FOR VISIT:  Follow-up for right lung adenocarcinoma  PRIOR THERAPY: Right lobectomy and lymph node biopsy on 03/07/2019  CURRENT THERAPY: Surveillance  BRIEF ONCOLOGIC HISTORY:   Oncology History  Adenocarcinoma of right lung, stage 1 (HCC)  03/22/2019 Initial Diagnosis   Adenocarcinoma of right lung, stage 1 (HCC)   03/22/2019 Cancer Staging   Staging form: Lung, AJCC 8th Edition - Clinical: cT1, cN0, cM0 - Signed by Rogers Hai, MD on 03/22/2019     CANCER STAGING: Cancer Staging  Adenocarcinoma of right lung, stage 1 (HCC) Staging form: Lung, AJCC 8th Edition - Clinical: cT1, cN0, cM0 - Signed by Rogers Hai, MD on 03/22/2019   INTERVAL HISTORY:   Gregory Lindsey, a 73 y.o. male, returns for routine follow-up of his history of stage I right lung adenocarcinoma. Jonahtan was last seen on 04/05/2023 by Dr. Rogers.   At today's visit, he  reports feeling ***.   ***He denies any recent hospitalizations, surgeries, or changes in his  baseline health status. He reports ***% energy and ***% appetite.   ***He is maintaining stable weight at this time. ***He denies any new cough, dyspnea, hemoptysis, voice changes, or chest pain. ***He denies any obvious sources of bleeding. ***He continues to take iron tablet twice weekly.  ***  ASSESSMENT & PLAN:  1.  Right lung adenocarcinoma (PT1CPN0): - Incidental right lung mass on a CT CAP done for chest and abdominal pain on 12/01/2018. - PET scan on 01/16/2019 shows 1.5 x 2.4 cm right lower lobe mass with SUV 3.2 with no adenopathy or metastatic disease. - MRI of the brain on 01/03/2019 was negative for metastatic disease. - He underwent right lobectomy and lymph node biopsy on 03/07/2019 which showed 2.3 cm  adenocarcinoma, acinar predominant.  0/8 lymph nodes from level 7, 11, 9, 12 were negative.  Margins were negative.  No visceral pleural invasion.  No lymphovascular invasion. - Most recent CT chest with contrast (03/27/2024): Redemonstration of postsurgical changes from prior right lung  lower lobe lobectomy.  Stable pre-existing lung nodules.  No new or suspicious lung nodule.  No lung mass. - Denies any cough or hemoptysis.  No chest pain.*** - PLAN: *** Eligible for discharge to PCP.  *** He should continue to receive annual LDCT chest indefinitely for lifelong surveillance due to high risk of recurrence or development of second primary lung cancer.*** - However since he is continuing to follow with us  for other issues noted below, we will plan on scheduling his low-dose CT chest due on 03/28/2025.  *** NCCN GUIDELINES for SURVIVORSHIP & SURVEILLANCE of NON SMALL CELL LUNG CANCER after completion of definitive therapy (as of September 2025): - STAGE I-II (primary treatment included surgery +/- chemotherapy):  H&P and CT chest with/without contrast every 6 months for 2 to 3 years Then H&P and low-dose non-contrast-enhanced CT annually***    2.  History of tobacco use - Quit smoking 20+ years ago, smoked 1/4 pack of cigarettes a day for 18 years followed by cigars and pipes for 10 years.  3.  Cystic lesions in the tail of the pancreas: - We reviewed results of MRI of the abdomen from 03/29/2023: Tiny cystic pancreatic lesions unchanged from MRI 1 year ago.  Differential considerations include indolent cystic neoplasm or pseudocyst. -  PLAN: Per note by Dr. Rogers on 04/05/2023, repeat MRI of the abdomen with/without contrast due in October 2026.***  4.  Elevated ferritin - Elevated ferritin trending upward since 2023 - Most recent labs (03/27/2024) show elevated ferritin 1677, with normal iron saturation 33% and normal serum iron. - He has been taking iron tablet for *** years due to mild  normocytic anemia from CKD stage IIIb.  *** - PLAN: With normal iron saturation/serum iron, hereditary hemochromatosis is less likely.  However, we will check hereditary hemochromatosis panel.  *** - Suspect elevated ferritin due to over repletion with oral iron.  Recommend to IMMEDIATELY stop oral iron and avoid excessive iron and vitamin C.  *** - Patient due for MRI abdomen in October 2026, will assess liver iron content at that time as well. - RTC for repeat iron levels in 1 year, unless hereditary hemochromatosis panel is positive for mutation.  ***  5.  Mild normocytic anemia - Likely secondary to CKD stage IIIb - Most recent labs (03/27/2024): Hgb 12.2, ferritin 1677, iron saturation 33%, creatinine 1.72/GFR 42 - He takes iron tablet twice weekly *** - PLAN: Patient instructed to STOP iron tablet at this time.  No treatment of anemia needed at present.  ***  PLAN SUMMARY: >> Labs TODAY = hereditary hemochromatosis panel *** >> MRI abdomen with/without contrast due 03/29/2025 >> Low-dose CT chest without contrast due 03/29/2025 >> Labs in 1 year = CBC/D, CMP, ferritin, and/TIBC >> OFFICE visit in 1 year (1 week after labs/imaging)    REVIEW OF SYSTEMS: ***  Review of Systems - Oncology  PHYSICAL EXAM:   Performance status (ECOG): {CHL ONC ED:[id number]} *** There were no vitals filed for this visit. Wt Readings from Last 3 Encounters:  04/05/23 (!) 339 lb (153.8 kg)  04/01/22 (!) 322 lb 6.4 oz (146.2 kg)  09/23/21 (!) 309 lb (140.2 kg)   Physical Exam   PAST MEDICAL/SURGICAL HISTORY:  Past Medical History:  Diagnosis Date   AKI (acute kidney injury) 02/2019   Diabetes mellitus    Hypercholesterolemia    Hypertension    Pneumonia    Past Surgical History:  Procedure Laterality Date   CHOLECYSTECTOMY N/A 01/22/2016   Procedure: LAPAROSCOPIC CHOLECYSTECTOMY;  Surgeon: Oneil Budge, MD;  Location: [facility]S;  Service: General;  Laterality: N/A;   COLONOSCOPY  2007 [facility]   1  CM SIMPLE ADENOMA   COLONOSCOPY N/A 04/26/2015   Procedure: COLONOSCOPY;  Surgeon: Margo LITTIE Haddock, MD;  Location: [facility] SUITE;  Service: Endoscopy;  Laterality: N/A;  930    VIDEO ASSISTED THORACOSCOPY (VATS)/ LOBECTOMY Right 03/07/2019   Procedure: VIDEO ASSISTED THORACOSCOPY (VATS)/RIGHT LOWER LOBECTOMY;  Surgeon: Shyrl Linnie KIDD, MD;  Location: [facility] OR;  Service: Thoracic;  Laterality: Right;   VIDEO BRONCHOSCOPY N/A 03/07/2019   Procedure: VIDEO BRONCHOSCOPY;  Surgeon: Shyrl Linnie KIDD, MD;  Location: [facility] OR;  Service: Thoracic;  Laterality: N/A;   VIDEO BRONCHOSCOPY WITH ENDOBRONCHIAL NAVIGATION N/A 02/22/2019   Procedure: VIDEO BRONCHOSCOPY WITH ENDOBRONCHIAL NAVIGATION WITH BIOPIES;  Surgeon: Shyrl Linnie KIDD, MD;  Location: [facility] OR;  Service: Thoracic;  Laterality: N/A;    SOCIAL HISTORY:  Social History   Socioeconomic History   Marital status: Married    Spouse name: Not on file   Number of children: Not on file   Years of education: Not on file   Highest education level: Not on file  Occupational History   Occupation: retired  Tobacco Use   Smoking status: Former  Smokeless tobacco: Never  Vaping Use   Vaping status: Never Used  Substance and Sexual Activity   Alcohol use: Yes    Alcohol/week: 0.0 standard drinks of alcohol    Comment: beer 2-3 times per week, 2 shots of scotch on saturday night , rarely since June   Drug use: No   Sexual activity: Not Currently  Other Topics Concern   Not on file  Social History Narrative   Not on file   Social Drivers of Health   Financial Resource Strain: Low Risk  (12/07/2018)   Overall Financial Resource Strain (CARDIA)    Difficulty of Paying Living Expenses: Not very hard  Food Insecurity: No Food Insecurity (12/07/2018)   Hunger Vital Sign    Worried About Running Out of Food in the Last Year: Never true    Ran Out of Food in the Last Year: Never true  Transportation Needs: No Transportation Needs (12/07/2018)    PRAPARE - Administrator, Civil Service (Medical): No    Lack of Transportation (Non-Medical): No  Physical Activity: Inactive (12/07/2018)   Exercise Vital Sign    Days of Exercise per Week: 0 days    Minutes of Exercise per Session: 0 min  Stress: No Stress Concern Present (12/07/2018)   [employer] of Occupational Health - Occupational Stress Questionnaire    Feeling of Stress : Not at all  Social Connections: Moderately Isolated (12/07/2018)   Social Connection and Isolation Panel    Frequency of Communication with Friends and Family: More than three times a week    Frequency of Social Gatherings with Friends and Family: More than three times a week    Attends Religious Services: Never    Database administrator or Organizations: No    Attends Banker Meetings: Never    Marital Status: Married  Catering manager Violence: Not At Risk (12/07/2018)   Humiliation, Afraid, Rape, and Kick questionnaire    Fear of Current or Ex-Partner: No    Emotionally Abused: No    Physically Abused: No    Sexually Abused: No    FAMILY HISTORY:  Family History  Problem Relation Age of Onset   Cancer Paternal Grandfather    Colon cancer Neg Hx     CURRENT MEDICATIONS:  Current Outpatient Medications  Medication Sig Dispense Refill   amLODipine  (NORVASC ) 10 MG tablet Take 1 tablet (10 mg total) by mouth daily. 30 tablet 3   aspirin  81 MG tablet Take 81 mg by mouth every evening.      blood glucose meter kit and supplies KIT Dispense based on patient and insurance preference. Use up to four times daily as directed. (FOR ICD-9 250.00, 250.01). 1 each 0   cyanocobalamin  2000 MCG tablet Take 2,000 mcg by mouth 2 (two) times daily.      ferrous sulfate  325 (65 FE) MG tablet Take 325 mg by mouth 2 (two) times daily.     hydrochlorothiazide (HYDRODIURIL) 25 MG tablet Take 25 mg by mouth daily.     HYDROcodone -acetaminophen  (NORCO/VICODIN) 5-325 MG tablet Take 1 tablet by  mouth every 6 (six) hours as needed. 15 tablet 0   insulin  aspart protamine- aspart (NOVOLOG  MIX 70/30) (70-30) 100 UNIT/ML injection Inject 0.08 mLs (8 Units total) into the skin 2 (two) times daily with a meal. (Patient taking differently: Inject 16 Units into the skin 2 (two) times daily with a meal.) 10 mL 11   lidocaine  (LIDODERM ) 5 % Place 1 patch  onto the skin daily. Remove & Discard patch within 12 hours or as directed by MD 30 patch 0   metoprolol succinate (TOPROL-XL) 100 MG 24 hr tablet Take 100 mg by mouth daily.     Multiple Vitamin (MULTIVITAMIN WITH MINERALS) TABS tablet Take 1 tablet by mouth daily.     naproxen  (NAPROSYN ) 500 MG tablet Take 1 tablet (500 mg total) by mouth 2 (two) times daily. 20 tablet 0   pravastatin  (PRAVACHOL ) 40 MG tablet Take 40 mg by mouth daily.      terbinafine (LAMISIL) 250 MG tablet Take 250 mg by mouth daily.     No current facility-administered medications for this visit.    ALLERGIES:  No Known Allergies  LABORATORY DATA:  I have reviewed the labs as listed.     Latest Ref Rng & Units 03/27/2024    8:39 AM 03/29/2023    8:51 AM 03/25/2022    9:00 AM  CBC  WBC 4.0 - 10.5 K/uL 7.3  9.5  8.3   Hemoglobin 13.0 - 17.0 g/dL 87.7  88.6  87.8   Hematocrit 39.0 - 52.0 % 38.3  35.2  38.1   Platelets 150 - 400 K/uL 194  181  229       Latest Ref Rng & Units 03/27/2024    8:39 AM 03/29/2023    8:51 AM 03/25/2022    9:00 AM  CMP  Glucose 70 - 99 mg/dL 705  870  854   BUN 8 - 23 mg/dL 23  23  31    Creatinine 0.61 - 1.24 mg/dL 8.27  8.49  8.33   Sodium 135 - 145 mmol/L 137  134  137   Potassium 3.5 - 5.1 mmol/L 4.3  3.6  4.1   Chloride 98 - 111 mmol/L 103  105  106   CO2 22 - 32 mmol/L 23  21  22    Calcium 8.9 - 10.3 mg/dL 9.3  8.5  8.8   Total Protein 6.5 - 8.1 g/dL 7.4  7.2  7.6   Total Bilirubin 0.0 - 1.2 mg/dL 0.4  0.6  1.1   Alkaline Phos 38 - 126 U/L 99  73  68   AST 15 - 41 U/L 22  27  30    ALT 0 - 44 U/L 30  29  31       DIAGNOSTIC IMAGING:  I have independently reviewed the scans and discussed with the patient. CT Chest W Contrast Result Date: 03/27/2024 CLINICAL DATA:  Non-small cell lung cancer (NSCLC), monitor. * Tracking Code: BO * EXAM: CT CHEST WITH CONTRAST TECHNIQUE: Multidetector CT imaging of the chest was performed during intravenous contrast administration. RADIATION DOSE REDUCTION: This exam was performed according to the departmental dose-optimization program which includes automated exposure control, adjustment of the mA and/or kV according to patient size and/or use of iterative reconstruction technique. CONTRAST:  60mL OMNIPAQUE  IOHEXOL  300 MG/ML  SOLN COMPARISON:  CT scan chest from 03/29/2023. FINDINGS: Cardiovascular: Normal cardiac size. No pericardial effusion. No aortic aneurysm. There are coronary artery calcifications, in keeping with coronary artery disease. There are also mild-to-moderate peripheral atherosclerotic vascular calcifications of thoracic aorta and its major branches. Mediastinum/Nodes: Visualized thyroid gland appears grossly unremarkable. No solid / cystic mediastinal masses. The esophagus is nondistended precluding optimal assessment. No axillary, mediastinal or hilar lymphadenopathy by size criteria. Lungs/Pleura: The central tracheo-bronchial tree is patent. Redemonstration of postsurgical changes from prior right lung lower lobe lobectomy. No mass or consolidation. No pleural effusion or  pneumothorax. Redemonstration of several calcified and noncalcified nodules throughout bilateral lungs (marked with electronic arrow sign on series 2004) with largest in the right upper lobe measuring up to 4 x 4 mm (series 3, image 60). No new or suspicious lung nodule. Upper Abdomen: Surgically absent gallbladder. Redemonstration of an ill-defined approximately 1.8 x 2.2 cm hypoattenuating structure in the right hepatic lobe (series 2, image 125), which is incompletely characterized on the  current exam but corresponds to a hemangioma on the prior MRI abdomen. Remaining visualized upper abdominal viscera within normal limits. Musculoskeletal: The visualized soft tissues of the chest wall are grossly unremarkable. No suspicious osseous lesions. There are mild multilevel degenerative changes in the visualized spine. IMPRESSION: 1. Redemonstration of postsurgical changes from prior right lung lower lobe lobectomy. Stable pre-existing lung nodules. No new or suspicious lung nodule. No lung mass, consolidation, pleural effusion or pneumothorax. 2. Multiple other nonacute observations, as described above. Aortic Atherosclerosis (ICD10-I70.0). Electronically Signed   By: Ree Molt M.D.   On: 03/27/2024 14:19     WRAP UP:  All questions were answered. The patient knows to call the clinic with any problems, questions or concerns.  Medical decision making: ***  Time spent on visit: I spent {CHL ONC TIME VISIT - DTPQU:[id number]} counseling the patient face to face. The total time spent in the appointment was {CHL ONC TIME VISIT - DTPQU:[id number]} and more than 50% was on counseling.  Pleasant CHRISTELLA Barefoot, PA-C  ***

## 2024-04-03 ENCOUNTER — Inpatient Hospital Stay: Payer: PPO | Admitting: Physician Assistant

## 2024-04-03 VITALS — BP 162/96 | HR 89 | Temp 97.7°F | Resp 20 | Ht 70.0 in | Wt 329.6 lb

## 2024-04-03 DIAGNOSIS — C3491 Malignant neoplasm of unspecified part of right bronchus or lung: Secondary | ICD-10-CM | POA: Diagnosis not present

## 2024-04-03 DIAGNOSIS — D631 Anemia in chronic kidney disease: Secondary | ICD-10-CM

## 2024-04-03 DIAGNOSIS — R7989 Other specified abnormal findings of blood chemistry: Secondary | ICD-10-CM | POA: Diagnosis not present

## 2024-04-03 DIAGNOSIS — K8689 Other specified diseases of pancreas: Secondary | ICD-10-CM | POA: Diagnosis not present

## 2024-04-03 DIAGNOSIS — I10 Essential (primary) hypertension: Secondary | ICD-10-CM

## 2024-04-03 DIAGNOSIS — N1831 Chronic kidney disease, stage 3a: Secondary | ICD-10-CM

## 2024-04-03 MED ORDER — LOSARTAN POTASSIUM 100 MG PO TABS: 100.0000 mg | ORAL_TABLET | Freq: Every day | ORAL | 0 refills | Status: AC

## 2024-04-03 MED ORDER — AMLODIPINE BESYLATE 10 MG PO TABS: 10.0000 mg | ORAL_TABLET | Freq: Every day | ORAL | 0 refills | Status: AC

## 2024-04-03 MED ORDER — METOPROLOL SUCCINATE ER 100 MG PO TB24: 100.0000 mg | ORAL_TABLET | Freq: Every day | ORAL | 0 refills | Status: AC

## 2024-04-03 NOTE — Patient Instructions (Signed)
 [facility] **VISIT SUMMARY & IMPORTANT INSTRUCTIONS **   You were seen today by Pleasant Barefoot PA-C for your history of lung cancer.    HISTORY OF LUNG CANCER Your CT scan from October 2025 did not show any sign of recurrent lung cancer. Your next CT chest will be done in October 2026.  PANCREATIC CYSTS We will check an MRI of your abdomen in October 2026 to follow-up on your pancreatic lesions.  MILD ANEMIA Your mild anemia is related to your chronic kidney disease. Your anemia is stable. You do not need any treatment for your anemia at this time.  ELEVATED IRON Your iron levels are very high.  This is most likely due to you taking iron pill for so many years. Please STOP taking iron pill at this time and do not restart it unless you are instructed to do so by our office.  OTHER TESTS / REFERRALS: Call [phone] for assistance finding a primary care doctor near you.  MEDICATIONS: Refill prescriptions (90-day supply) for your blood pressure medications has been sent to your pharmacy.  FOLLOW-UP APPOINTMENT: 1 year  ** Thank you for trusting me with your healthcare!  I strive to provide all of my patients with quality care at each visit.  If you receive a survey for this visit, I would be so grateful to you for taking the time to provide feedback.  Thank you in advance!  ~ Keric Zehren                                        Dr. Mickiel Davonna Pleasant Barefoot, PA-C       Delon Hope, NP   - - - - - - - - - - - - - - - - - -    Thank you for choosing [facility] at [facility] to provide your oncology and hematology care.  To afford each patient quality time with our provider, please arrive at least 15 minutes before your scheduled appointment time.   If you have a lab appointment with the Cancer Center please come in thru the Main Entrance and check in at the main information desk.  You need to re-schedule  your appointment should you arrive 10 or more minutes late.  We strive to give you quality time with our providers, and arriving late affects you and other patients whose appointments are after yours.  Also, if you no show three or more times for appointments you may be dismissed from the clinic at the providers discretion.     Again, thank you for choosing [facility].  Our hope is that these requests will decrease the amount of time that you wait before being seen by our physicians.       _____________________________________________________________  Should you have questions after your visit to [facility], please contact our office at [phone] and follow the prompts.  Our office hours are 8:00 a.m. and 4:30 p.m. Monday - Friday.  Please note that voicemails left after 4:00 p.m. may not be returned until the following business day.  We are closed weekends and major holidays.  You do have access to a nurse 24-7, just call the main number to the clinic [phone] and do not press any options, hold on the line and a nurse  will answer the phone.    For prescription refill requests, have your pharmacy contact our office and allow 72 hours.

## 2025-03-29 ENCOUNTER — Inpatient Hospital Stay

## 2025-03-29 ENCOUNTER — Other Ambulatory Visit (HOSPITAL_COMMUNITY)

## 2025-03-29 ENCOUNTER — Ambulatory Visit (HOSPITAL_COMMUNITY)

## 2025-04-09 ENCOUNTER — Inpatient Hospital Stay: Admitting: Physician Assistant
